# Patient Record
Sex: Male | Born: 1945
Health system: Southern US, Community
[De-identification: ages and names within clinical notes are randomized; demographics above are authoritative.]

## PROBLEM LIST (undated history)

## (undated) DIAGNOSIS — J302 Other seasonal allergic rhinitis: Secondary | ICD-10-CM

## (undated) DIAGNOSIS — I2699 Other pulmonary embolism without acute cor pulmonale: Secondary | ICD-10-CM

## (undated) DIAGNOSIS — E785 Hyperlipidemia, unspecified: Secondary | ICD-10-CM

## (undated) DIAGNOSIS — E039 Hypothyroidism, unspecified: Secondary | ICD-10-CM

## (undated) DIAGNOSIS — K5790 Diverticulosis of intestine, part unspecified, without perforation or abscess without bleeding: Secondary | ICD-10-CM

## (undated) DIAGNOSIS — I82409 Acute embolism and thrombosis of unspecified deep veins of unspecified lower extremity: Secondary | ICD-10-CM

## (undated) DIAGNOSIS — K649 Unspecified hemorrhoids: Secondary | ICD-10-CM

## (undated) DIAGNOSIS — M5412 Radiculopathy, cervical region: Secondary | ICD-10-CM

## (undated) DIAGNOSIS — J3089 Other allergic rhinitis: Secondary | ICD-10-CM

## (undated) DIAGNOSIS — K219 Gastro-esophageal reflux disease without esophagitis: Secondary | ICD-10-CM

## (undated) HISTORY — DX: Radiculopathy, cervical region: M54.12

## (undated) HISTORY — DX: Acute embolism and thrombosis of unspecified deep veins of unspecified lower extremity: I82.409

## (undated) HISTORY — DX: Diverticulosis of intestine, part unspecified, without perforation or abscess without bleeding: K57.90

## (undated) HISTORY — DX: Other seasonal allergic rhinitis: J30.2

## (undated) HISTORY — DX: Gastro-esophageal reflux disease without esophagitis: K21.9

## (undated) HISTORY — DX: Hyperlipidemia, unspecified: E78.5

## (undated) HISTORY — DX: Hypothyroidism, unspecified: E03.9

## (undated) HISTORY — DX: Gilbert syndrome: E80.4

## (undated) HISTORY — DX: Other pulmonary embolism without acute cor pulmonale: I26.99

## (undated) HISTORY — DX: Unspecified hemorrhoids: K64.9

## (undated) HISTORY — PX: WISDOM TOOTH EXTRACTION: SHX21

## (undated) HISTORY — DX: Other seasonal allergic rhinitis: J30.89

---

## 2001-04-28 ENCOUNTER — Emergency Department (HOSPITAL_COMMUNITY): Admission: EM | Admit: 2001-04-28 | Discharge: 2001-04-28 | Payer: Self-pay | Admitting: Emergency Medicine

## 2001-04-28 ENCOUNTER — Encounter: Payer: Self-pay | Admitting: Emergency Medicine

## 2001-05-24 ENCOUNTER — Ambulatory Visit (HOSPITAL_COMMUNITY): Admission: RE | Admit: 2001-05-24 | Discharge: 2001-05-24 | Payer: Self-pay | Admitting: Cardiology

## 2001-05-24 ENCOUNTER — Encounter: Payer: Self-pay | Admitting: Cardiology

## 2003-06-10 ENCOUNTER — Encounter: Payer: Self-pay | Admitting: Internal Medicine

## 2004-08-29 ENCOUNTER — Ambulatory Visit: Payer: Self-pay | Admitting: Internal Medicine

## 2004-10-05 ENCOUNTER — Ambulatory Visit: Payer: Self-pay | Admitting: Internal Medicine

## 2004-10-06 ENCOUNTER — Ambulatory Visit: Payer: Self-pay | Admitting: Internal Medicine

## 2004-12-26 ENCOUNTER — Ambulatory Visit: Payer: Self-pay | Admitting: Internal Medicine

## 2005-07-11 ENCOUNTER — Ambulatory Visit: Payer: Self-pay | Admitting: Internal Medicine

## 2006-10-01 ENCOUNTER — Ambulatory Visit: Payer: Self-pay | Admitting: Gastroenterology

## 2006-10-15 ENCOUNTER — Ambulatory Visit: Payer: Self-pay | Admitting: Gastroenterology

## 2006-10-15 ENCOUNTER — Encounter: Payer: Self-pay | Admitting: Internal Medicine

## 2007-01-14 DIAGNOSIS — K219 Gastro-esophageal reflux disease without esophagitis: Secondary | ICD-10-CM

## 2007-01-14 HISTORY — DX: Other disorders of bilirubin metabolism: E80.6

## 2007-01-14 HISTORY — DX: Gastro-esophageal reflux disease without esophagitis: K21.9

## 2007-01-17 ENCOUNTER — Ambulatory Visit: Payer: Self-pay | Admitting: Internal Medicine

## 2007-01-19 LAB — CONVERTED CEMR LAB
ALT: 32 units/L (ref 0–40)
Basophils Absolute: 0 10*3/uL (ref 0.0–0.1)
Basophils Relative: 0.5 % (ref 0.0–1.0)
Calcium: 9.6 mg/dL (ref 8.4–10.5)
Cholesterol: 240 mg/dL (ref 0–200)
Creatinine, Ser: 1 mg/dL (ref 0.4–1.5)
Direct LDL: 147.8 mg/dL
Eosinophils Relative: 1.5 % (ref 0.0–5.0)
GFR calc Af Amer: 97 mL/min
Glucose, Bld: 90 mg/dL (ref 70–99)
HDL: 42.4 mg/dL (ref >39.0)
Hgb A1c MFr Bld: 5.6 % (ref 4.6–6.0)
Lymphocytes Relative: 30.3 % (ref 12.0–46.0)
Monocytes Relative: 12.1 % — ABNORMAL HIGH (ref 3.0–11.0)
Neutrophils Relative %: 55.6 % (ref 43.0–77.0)
PSA: 0.19 ng/mL (ref 0.10–4.00)
Platelets: 212 10*3/uL (ref 150–400)
RBC: 4.77 M/uL (ref 4.22–5.81)
Total Bilirubin: 0.8 mg/dL (ref 0.3–1.2)
VLDL: 41 mg/dL — ABNORMAL HIGH (ref 0–40)
WBC: 5.3 10*3/uL (ref 4.5–10.5)

## 2008-01-21 ENCOUNTER — Encounter: Admission: RE | Admit: 2008-01-21 | Discharge: 2008-01-21 | Payer: Self-pay | Admitting: Orthopaedic Surgery

## 2008-06-09 ENCOUNTER — Encounter: Payer: Self-pay | Admitting: Internal Medicine

## 2008-08-14 HISTORY — PX: COLONOSCOPY: SHX174

## 2009-07-30 ENCOUNTER — Ambulatory Visit: Payer: Self-pay | Admitting: Internal Medicine

## 2009-08-08 LAB — CONVERTED CEMR LAB
ALT: 24 units/L (ref 0–53)
Alkaline Phosphatase: 50 units/L (ref 39–117)
BUN: 15 mg/dL (ref 6–23)
Basophils Relative: 0.4 % (ref 0.0–3.0)
Bilirubin, Direct: 0.1 mg/dL (ref 0.0–0.3)
CO2: 29 meq/L (ref 19–32)
Calcium: 9.2 mg/dL (ref 8.4–10.5)
Chloride: 104 meq/L (ref 96–112)
Eosinophils Relative: 4.7 % (ref 0.0–5.0)
Lymphocytes Relative: 26.4 % (ref 12.0–46.0)
MCV: 95 fL (ref 78.0–100.0)
Monocytes Relative: 10.6 % (ref 3.0–12.0)
Neutrophils Relative %: 57.9 % (ref 43.0–77.0)
PSA: 0.28 ng/mL (ref 0.10–4.00)
Potassium: 4.3 meq/L (ref 3.5–5.1)
RBC: 4.56 M/uL (ref 4.22–5.81)
RDW: 12.3 % (ref 11.5–14.6)
Sodium: 140 meq/L (ref 135–145)
Total Protein: 7.1 g/dL (ref 6.0–8.3)
Triglycerides: 190 mg/dL — ABNORMAL HIGH (ref 0.0–149.0)
WBC: 5.9 10*3/uL (ref 4.5–10.5)

## 2009-08-09 ENCOUNTER — Encounter (INDEPENDENT_AMBULATORY_CARE_PROVIDER_SITE_OTHER): Payer: Self-pay | Admitting: *Deleted

## 2009-08-16 ENCOUNTER — Ambulatory Visit: Payer: Self-pay | Admitting: Internal Medicine

## 2009-08-16 DIAGNOSIS — R9431 Abnormal electrocardiogram [ECG] [EKG]: Secondary | ICD-10-CM

## 2009-08-16 DIAGNOSIS — J309 Allergic rhinitis, unspecified: Secondary | ICD-10-CM | POA: Insufficient documentation

## 2009-08-16 DIAGNOSIS — E785 Hyperlipidemia, unspecified: Secondary | ICD-10-CM | POA: Insufficient documentation

## 2009-08-16 DIAGNOSIS — K5730 Diverticulosis of large intestine without perforation or abscess without bleeding: Secondary | ICD-10-CM

## 2009-08-16 HISTORY — DX: Diverticulosis of large intestine without perforation or abscess without bleeding: K57.30

## 2009-08-16 HISTORY — DX: Abnormal electrocardiogram (ECG) (EKG): R94.31

## 2009-08-16 HISTORY — DX: Allergic rhinitis, unspecified: J30.9

## 2009-09-01 ENCOUNTER — Encounter (INDEPENDENT_AMBULATORY_CARE_PROVIDER_SITE_OTHER): Payer: Self-pay | Admitting: *Deleted

## 2009-09-24 ENCOUNTER — Encounter: Payer: Self-pay | Admitting: Internal Medicine

## 2009-09-27 ENCOUNTER — Encounter: Payer: Self-pay | Admitting: Internal Medicine

## 2009-09-28 ENCOUNTER — Encounter: Payer: Self-pay | Admitting: Internal Medicine

## 2009-10-01 ENCOUNTER — Encounter: Admission: RE | Admit: 2009-10-01 | Discharge: 2009-10-01 | Payer: Self-pay | Admitting: Cardiology

## 2009-10-08 ENCOUNTER — Ambulatory Visit (HOSPITAL_COMMUNITY): Admission: RE | Admit: 2009-10-08 | Discharge: 2009-10-08 | Payer: Self-pay | Admitting: Cardiology

## 2009-10-08 ENCOUNTER — Encounter: Payer: Self-pay | Admitting: Internal Medicine

## 2009-10-11 ENCOUNTER — Encounter: Payer: Self-pay | Admitting: Internal Medicine

## 2010-02-18 ENCOUNTER — Encounter: Payer: Self-pay | Admitting: Internal Medicine

## 2010-06-13 ENCOUNTER — Telehealth (INDEPENDENT_AMBULATORY_CARE_PROVIDER_SITE_OTHER): Payer: Self-pay | Admitting: *Deleted

## 2010-08-31 ENCOUNTER — Other Ambulatory Visit: Payer: Self-pay | Admitting: Internal Medicine

## 2010-08-31 ENCOUNTER — Ambulatory Visit
Admission: RE | Admit: 2010-08-31 | Discharge: 2010-08-31 | Payer: Self-pay | Source: Home / Self Care | Attending: Internal Medicine | Admitting: Internal Medicine

## 2010-08-31 LAB — HEPATIC FUNCTION PANEL
ALT: 27 U/L (ref 0–53)
AST: 26 U/L (ref 0–37)
Albumin: 3.8 g/dL (ref 3.5–5.2)
Alkaline Phosphatase: 54 U/L (ref 39–117)
Bilirubin, Direct: 0.1 mg/dL (ref 0.0–0.3)
Total Bilirubin: 0.7 mg/dL (ref 0.3–1.2)
Total Protein: 6.7 g/dL (ref 6.0–8.3)

## 2010-08-31 LAB — LIPID PANEL
Cholesterol: 179 mg/dL (ref 0–200)
HDL: 44.8 mg/dL (ref >39.00)
LDL Cholesterol: 104 mg/dL — ABNORMAL HIGH (ref 0–99)
Total CHOL/HDL Ratio: 4
Triglycerides: 150 mg/dL — ABNORMAL HIGH (ref 0.0–149.0)
VLDL: 30 mg/dL (ref 0.0–40.0)

## 2010-08-31 LAB — CBC WITH DIFFERENTIAL/PLATELET
Basophils Absolute: 0 10*3/uL (ref 0.0–0.1)
Basophils Relative: 0.4 % (ref 0.0–3.0)
Eosinophils Absolute: 0.1 10*3/uL (ref 0.0–0.7)
Eosinophils Relative: 1.9 % (ref 0.0–5.0)
HCT: 42.9 % (ref 39.0–52.0)
Hemoglobin: 14.7 g/dL (ref 13.0–17.0)
Lymphocytes Relative: 32.4 % (ref 12.0–46.0)
Lymphs Abs: 1.9 10*3/uL (ref 0.7–4.0)
MCHC: 34.2 g/dL (ref 30.0–36.0)
MCV: 93.4 fl (ref 78.0–100.0)
Monocytes Absolute: 0.7 10*3/uL (ref 0.1–1.0)
Monocytes Relative: 12.7 % — ABNORMAL HIGH (ref 3.0–12.0)
Neutro Abs: 3 10*3/uL (ref 1.4–7.7)
Neutrophils Relative %: 52.6 % (ref 43.0–77.0)
Platelets: 194 10*3/uL (ref 150.0–400.0)
RBC: 4.59 Mil/uL (ref 4.22–5.81)
RDW: 13.4 % (ref 11.5–14.6)
WBC: 5.7 10*3/uL (ref 4.5–10.5)

## 2010-08-31 LAB — BASIC METABOLIC PANEL
BUN: 20 mg/dL (ref 6–23)
CO2: 27 mEq/L (ref 19–32)
Calcium: 9.1 mg/dL (ref 8.4–10.5)
Chloride: 108 mEq/L (ref 96–112)
Creatinine, Ser: 1.3 mg/dL (ref 0.4–1.5)
GFR: 59.73 mL/min — ABNORMAL LOW (ref >60.00)
Glucose, Bld: 89 mg/dL (ref 70–99)
Potassium: 4.6 mEq/L (ref 3.5–5.1)
Sodium: 141 mEq/L (ref 135–145)

## 2010-08-31 LAB — TSH: TSH: 6.62 u[IU]/mL — ABNORMAL HIGH (ref 0.35–5.50)

## 2010-08-31 LAB — PSA: PSA: 0.29 ng/mL (ref 0.10–4.00)

## 2010-08-31 LAB — CONVERTED CEMR LAB
Ketones, urine, test strip: NEGATIVE
Protein, U semiquant: NEGATIVE
WBC Urine, dipstick: NEGATIVE

## 2010-09-01 ENCOUNTER — Encounter: Payer: Self-pay | Admitting: Internal Medicine

## 2010-09-07 ENCOUNTER — Other Ambulatory Visit: Payer: Self-pay | Admitting: Internal Medicine

## 2010-09-07 ENCOUNTER — Ambulatory Visit
Admission: RE | Admit: 2010-09-07 | Discharge: 2010-09-07 | Payer: Self-pay | Source: Home / Self Care | Attending: Internal Medicine | Admitting: Internal Medicine

## 2010-09-07 DIAGNOSIS — R946 Abnormal results of thyroid function studies: Secondary | ICD-10-CM

## 2010-09-07 HISTORY — DX: Abnormal results of thyroid function studies: R94.6

## 2010-09-07 LAB — TSH: TSH: 5.52 u[IU]/mL — ABNORMAL HIGH (ref 0.35–5.50)

## 2010-09-07 LAB — T3, FREE: T3, Free: 2.5 pg/mL (ref 2.3–4.2)

## 2010-09-13 NOTE — Letter (Signed)
Summary: Cancer Screening/[facility] Personalized Risk Profile  Cancer Screening/[facility] Personalized Risk Profile   Imported By: Lanelle Bal 08/23/2009 08:36:36  _____________________________________________________________________  External Attachment:    Type:   Image     Comment:   External Document

## 2010-09-13 NOTE — Letter (Signed)
Summary: Primary Care Consult Scheduled Letter  [facility] [place]  [address] [place], [place] [address]   Phone: [phone]  Fax: [phone]      09/01/2009 MRN: [id number]  Chase Hill [address] [place], [place]  [address]    Dear Mr. LANCON,    We have scheduled an appointment for you.  At the recommendation of Dr. Marga Melnick, you have been scheduled for a consult with Dr. Julieanne Manson with [facility] on 09-24-2009 at 9:00am.  Their address is [address], [place] [address]. The office phone number is [phone].  If this appointment day and time is not convenient for you, please feel free to call the office of the doctor you are being referred to at the number listed above and reschedule the appointment.    It is important for you to keep your scheduled appointments. We are here to make sure you are given good patient care.   Thank you,    Renee, Patient Care Coordinator [place] at [facility]

## 2010-09-13 NOTE — Consult Note (Signed)
Summary: [facility]  [facility]   Imported By: Lanelle Bal 10/02/2009 08:44:37  _____________________________________________________________________  External Attachment:    Type:   Image     Comment:   External Document

## 2010-09-13 NOTE — Assessment & Plan Note (Signed)
Summary: cpx/ns/kdc   Vital Signs:  Patient profile:   65 year old male Height:      72 inches Weight:      220 pounds BMI:     29.95 Temp:     98.3 degrees F oral Pulse rate:   80 / minute Resp:     14 per minute BP sitting:   112 / 82  (left arm) Cuff size:   large  Vitals Entered By: Shonna Chock (August 16, 2009 1:52 PM)  Comments REVIEWED MED LIST, PATIENT AGREED DOSE AND INSTRUCTION CORRECT    History of Present Illness: Chase Hill is here for a physical; he had  non exertional chest pains 2-3 weeks ago in context of  stress X 1 hour. No cp with CVE.PMH of PVCs; nuclear stress test nagative 2002 by Dr Clarene Duke. All symptoms have resolved. See LDL  down from 163 to 127.6 on Red Rice Yeast. Vytorin caused myalgias  Preventive Screening-Counseling & Management  Caffeine-Diet-Exercise     Does Patient Exercise: yes  Allergies: No Known Drug Allergies  Past History:  Past Medical History: Gilbert's Syndrome Allergic rhinitis GERD PVCs 2002 , neg Stress Test, Dr Clarene Duke Hyperlipidemia: NMR 2004: LDL 163(2082/1146), TG 158, HDL 42; LDL goal = < 100. Diverticulosis, colon 2008  Past Surgical History: Wisdom Teeth Extraction; Colonoscopy : Tics 2008, due 2019, Dr Jarold Motto  Family History: Father: CAD, CHF Mother: breast Ca Siblings: none; P aunt CAD; PGF CAD; MGM CVA; M aunt CAD;   Social History: Never Smoked Alcohol use-yes-socially Occupation: Attorney Married Regular exercise-yes: walks golf course 2X/week Does Patient Exercise:  yes  Review of Systems       The patient complains of suspicious skin lesions.  The patient denies anorexia, fever, vision loss, decreased hearing, hoarseness, prolonged cough, headaches, hemoptysis, abdominal pain, melena, hematochezia, severe indigestion/heartburn, hematuria, incontinence, depression, unusual weight change, abnormal bleeding, enlarged lymph nodes, and angioedema.         Weight up 5# from holidays. New lesion  R thigh, ? darker. CV:  Denies chest pain or discomfort, difficulty breathing at night, difficulty breathing while lying down, leg cramps with exertion, lightheadness, near fainting, palpitations, shortness of breath with exertion, swelling of feet, and swelling of hands.  Physical Exam  General:  well-nourished,in no acute distress; alert,appropriate and cooperative throughout examination Head:  Normocephalic and atraumatic without obvious abnormalities.  Eyes:  No corneal or conjunctival inflammation noted.Perrla. Funduscopic exam benign, without hemorrhages, exudates or papilledema.  Ears:  External ear exam shows no significant lesions or deformities.  Otoscopic examination reveals clear canals, tympanic membranes are intact bilaterally without bulging, retraction, inflammation or discharge. Hearing is grossly normal bilaterally. Nose:  External nasal examination shows no deformity or inflammation. Nasal mucosa are pink and moist without lesions or exudates. Mouth:  Oral mucosa and oropharynx without lesions or exudates.  Teeth in good repair. Neck:  No deformities, masses, or tenderness noted. Lungs:  Normal respiratory effort, chest expands symmetrically. Lungs are clear to auscultation, no crackles or wheezes. Heart:  normal rate, no gallop, no rub, no JVD, no HJR, and grade1/2-1   /6 systolic murmur @ base.   Abdomen:  Bowel sounds positive,abdomen soft and non-tender without masses, organomegaly. Rectal:  No external abnormalities noted. Normal sphincter tone. No rectal masses or tenderness. Genitalia:  Testes bilaterally descended without nodularity, tenderness or masses. No scrotal masses or lesions. No penis lesions or urethral discharge. L varicocele.   Prostate:  Prostate gland firm and  smooth, no enlargement, nodularity, tenderness, mass, asymmetry or induration. Msk:  No deformity or scoliosis noted of thoracic or lumbar spine.   Pulses:  R and L carotid,radial,dorsalis pedis and  posterior tibial pulses are full and equal bilaterally Extremities:  No clubbing, cyanosis, edema, or deformity noted with normal full range of motion of all joints.  Minor crepitus of knees  Neurologic:  alert & oriented X3 and DTRs symmetrical and normal.   Cervical Nodes:  No lymphadenopathy noted Axillary Nodes:  No palpable lymphadenopathy Inguinal Nodes:  No significant adenopathy Psych:  memory intact for recent and remote, normally interactive, and good eye contact.     Impression & Recommendations:  Problem # 1:  PREVENTIVE HEALTH CARE (ICD-V70.0)  Orders: EKG w/ Interpretation (93[address])  Problem # 2:  CHEST PAIN UNSPECIFIED (ICD-786.50)  resolved  Orders: EKG w/ Interpretation (93000) Cardiology Referral (Cardiology)  Problem # 3:  HYPERLIPIDEMIA (ICD-272.4)  Orders: EKG w/ Interpretation (93000)  Problem # 4:  ELECTROCARDIOGRAM, ABNORMAL (ICD-794.31)  RBBB & L post fascicular block new since 01/17/2007  Orders: EKG w/ Interpretation (93[address]) Cardiology Referral (Cardiology)  Problem # 5:  REFLUX, ESOPHAGEAL (ICD-530.81)  Problem # 6:  DIVERTICULOSIS, COLON (ICD-562.10)  Complete Medication List: 1)  Claritin 10 Mg Tabs (Loratadine) .... Prn 2)  Multivitamins Caps (Multiple vitamin) .Marland Kitchen.. 1 by mouth qd 3)  Propecia Tabs (Finasteride tabs) .Marland Kitchen.. 1 by mouth qd 4)  Folic Acid 400 Mcg Tabs (Folic acid)  Patient Instructions: 1)  Consume LESS THAN 40 grams of sugar/ day from LABELED foods & drinks. 2)  Please schedule a follow-up appointment in 4 months. 3)  NMR Lipoprofile Lipid Panel prior to visit, ICD-9:272.4

## 2010-09-13 NOTE — Cardiovascular Report (Signed)
[facility]   Imported By: Lanelle Bal 10/22/2009 08:08:18  _____________________________________________________________________  External Attachment:    Type:   Image     Comment:   External Document

## 2010-09-13 NOTE — Progress Notes (Signed)
Summary: lab order  Phone Note Call from Patient   Summary of Call: patient has cpx scheduled [id number] ---lab [phone]   ---- need lab order  Initial call taken by: Okey Regal Spring,  June 13, 2010 4:13 PM  Follow-up for Phone Call        Lipid,Hep,BMP,CBCD,TSH, PSA,Udip, Stool Cards V70.0/272.4 Follow-up by: Shonna Chock CMA,  June 13, 2010 4:30 PM  Additional Follow-up for Phone Call Additional follow up Details #1::        added lab order to appt .Marland KitchenOkey Regal Spring  June 14, 2010 9:22 AM

## 2010-09-13 NOTE — Letter (Signed)
Summary: [facility]  [facility]   Imported By: Lanelle Bal 03/08/2010 09:18:24  _____________________________________________________________________  External Attachment:    Type:   Image     Comment:   External Document

## 2010-09-13 NOTE — Letter (Signed)
Summary: [facility]  [facility]   Imported By: Lanelle Bal 10/21/2009 10:48:42  _____________________________________________________________________  External Attachment:    Type:   Image     Comment:   External Document

## 2010-09-13 NOTE — Procedures (Signed)
Summary: Colonoscopy/[facility]  Colonoscopy/[facility]   Imported By: Lanelle Bal 08/23/2009 08:40:25  _____________________________________________________________________  External Attachment:    Type:   Image     Comment:   External Document

## 2010-09-15 NOTE — Assessment & Plan Note (Signed)
Summary: CPX/CBS   Vital Signs:  Patient profile:   65 year old male Height:      71.75 inches Weight:      220 pounds BMI:     30.15 Temp:     98.7 degrees F oral Pulse rate:   56 / minute Resp:     14 per minute BP sitting:   122 / 84  (left arm) Cuff size:   large  Vitals Entered By: Shonna Chock CMA (September 07, 2010 8:36 AM)  CC: CPX and discuss labs(copy given) , General Medical Evaluation   CC:  CPX and discuss labs(copy given)  and General Medical Evaluation.  History of Present Illness:    Chase Hill is here for a physical; he is asymptomatic.  Current Medications (verified): 1)  Claritin 10 Mg Tabs (Loratadine) .... Prn 2)  Multivitamins  Caps (Multiple Vitamin) .Marland Kitchen.. 1 By Mouth Qd 3)  Propecia  Tabs (Finasteride Tabs) .Marland Kitchen.. 1 By Mouth Qd 4)  Folic Acid 400 Mcg Tabs (Folic Acid) 5)  Pravastatin Sodium 20 Mg Tabs (Pravastatin Sodium) .Marland Kitchen.. 1 By Mouth Once Daily  Allergies (verified): No Known Drug Allergies  Past History:  Past Medical History: Gilbert's Syndrome Allergic rhinitis GERD PVCs 2002 , negative  Stress Test, Dr  Caprice Kluver Hyperlipidemia: NMR 2004: LDL 163(2082/1146), TG 158, HDL 42; LDL goal = < 100. Diverticulosis, colon 2008 C-7 radiculitis , PMH of   Past Surgical History: Wisdom Teeth Extraction; Colonoscopy : Diverticulosis 2008, due 2018, Dr Sheryn Bison; Catheterization done for chest pain & EKG changes 2011: normal, Al Little, MD  Family History: Father: CAD, CHF Mother: breast cancer Siblings: none; P aunt: CAD; PGF: CAD; MGM: CVA; M aunt :CAD;    Social History: Never Smoked Alcohol use-yes-socially Occupation: Pensions consultant Married Regular exercise-yes: walks golf course 2X/week, treadmill in home 1-2 mpd  2X/week in Winter  Review of Systems  The patient denies anorexia, fever, weight loss, weight gain, vision loss, decreased hearing, hoarseness, chest pain, syncope, dyspnea on exertion, peripheral edema, prolonged cough,  headaches, hemoptysis, abdominal pain, melena, hematochezia, severe indigestion/heartburn, hematuria, suspicious skin lesions, depression, unusual weight change, abnormal bleeding, enlarged lymph nodes, and angioedema.    Physical Exam  General:  well-nourished;alert,appropriate and cooperative throughout examination Head:  Normocephalic and atraumatic without obvious abnormalities.  Pattern  alopecia  Eyes:  No corneal or conjunctival inflammation noted. Perrla. Funduscopic exam benign, without hemorrhages, exudates or papilledema. Ears:  External ear exam shows no significant lesions or deformities.  Otoscopic examination reveals clear canals, tympanic membranes are intact bilaterally without bulging, retraction, inflammation or discharge. Hearing is grossly normal bilaterally. Nose:  External nasal examination shows no deformity or inflammation. Nasal mucosa are pink and moist without lesions or exudates. Mouth:  Oral mucosa and oropharynx without lesions or exudates.  Teeth in good repair. Neck:  No deformities, masses, or tenderness noted. Lungs:  Normal respiratory effort, chest expands symmetrically. Lungs are clear to auscultation, no crackles or wheezes. Heart:  normal rate, regular rhythm, no gallop, no rub, no JVD, no HJR, and grade 1 /6 systolic murmur.   Abdomen:  Bowel sounds positive,abdomen soft and non-tender without masses, organomegaly or hernias noted. Rectal:  No external abnormalities noted. Normal sphincter tone. No rectal masses or tenderness. Genitalia:  Testes bilaterally descended without nodularity, tenderness or masses. No scrotal masses or lesions. No penis lesions or urethral discharge. Small L varicocele.   Prostate:  Prostate gland firm and smooth, no enlargement, nodularity, tenderness, mass,  asymmetry or induration. Msk:  No deformity or scoliosis noted of thoracic or lumbar spine.   Pulses:  R and L carotid,radial,dorsalis pedis and posterior tibial pulses are  full and equal bilaterally Extremities:  No clubbing, cyanosis, edema, or deformity noted with normal full range of motion of all joints.   Neurologic:  alert & oriented X3, strength normal in all extremities, and DTRs symmetrical and normal.   Skin:  Intact without suspicious lesions or rashes Cervical Nodes:  No lymphadenopathy noted Axillary Nodes:  No palpable lymphadenopathy Inguinal Nodes:  No significant adenopathy Psych:  memory intact for recent and remote, normally interactive, and good eye contact.     Impression & Recommendations:  Problem # 1:  ROUTINE GENERAL MEDICAL EXAM@HEALTH  CARE FACL (ICD-V70.0)  Problem # 2:  HYPERLIPIDEMIA (ICD-272.4)  His updated medication list for this problem includes:    Pravastatin Sodium 20 Mg Tabs (Pravastatin sodium) .Marland Kitchen... 1 by mouth once daily  Problem # 3:  ELECTROCARDIOGRAM, ABNORMAL (ICD-794.31) PMH of, negative cath   Problem # 4:  REFLUX, ESOPHAGEAL (ICD-530.81) quiescent  Complete Medication List: 1)  Claritin 10 Mg Tabs (Loratadine) .... Prn 2)  Multivitamins Caps (Multiple vitamin) .Marland Kitchen.. 1 by mouth qd 3)  Propecia Tabs (Finasteride tabs) .Marland Kitchen.. 1 by mouth qd 4)  Folic Acid 400 Mcg Tabs (Folic acid) 5)  Pravastatin Sodium 20 Mg Tabs (Pravastatin sodium) .Marland Kitchen.. 1 by mouth once daily  Other Orders: Venipuncture (16109) TLB-TSH (Thyroid Stimulating Hormone) (84443-TSH) TLB-T3, Free (Triiodothyronine) (84481-T3FREE) TLB-T4 (Thyrox), Free [phone])  Patient Instructions: 1)  Please schedule a follow-up  fasting lab appointment in 4 months for TSH & [facility] Lipid Panel(794.5, 272.4)   Orders Added: 1)  Est. Patient 65& > [99397] 2)  Venipuncture [19147] 3)  TLB-TSH (Thyroid Stimulating Hormone) [84443-TSH] 4)  TLB-T3, Free (Triiodothyronine) [82956-O1HYQM] 5)  TLB-T4 (Thyrox), Free [57846-NG2X]  Appended Document: CPX/CBS   Appended Document: CPX/CBS   Immunizations Administered:  Influenza Vaccine # 1:     Vaccine Type: Fluvax Non-MCR    Site: left deltoid    Mfr: Sanofi Pasteur    Dose: 0.5 ml    Route: IM    Given by: Shonna Chock CMA    Exp. Date: 02/11/2011    Lot #: [id number]

## 2010-10-19 ENCOUNTER — Telehealth (INDEPENDENT_AMBULATORY_CARE_PROVIDER_SITE_OTHER): Payer: Self-pay | Admitting: *Deleted

## 2010-10-25 NOTE — Progress Notes (Signed)
Summary: questions about thyroid-lmom  Phone Note Call from Patient Call back at Work Phone [phone]   Caller: Patient Summary of Call: patient called had questions about thyroid   Follow-up for Phone Call        left message on machine .Marland KitchenMarland KitchenMarland KitchenDoristine Devoid CMA  October 19, 2010 2:36 PM   patient called back says that he will start taking thyroid medication....Marland KitchenMarland KitchenDoristine Devoid CMA  October 19, 2010 3:31 PM

## 2010-11-04 LAB — T4, FREE: Free T4: 0.93 ng/dL (ref 0.80–1.80)

## 2011-01-21 ENCOUNTER — Other Ambulatory Visit: Payer: Self-pay | Admitting: Internal Medicine

## 2011-01-23 ENCOUNTER — Telehealth: Payer: Self-pay | Admitting: Internal Medicine

## 2011-01-23 NOTE — Telephone Encounter (Signed)
Patient's wife called --gave her message below---she will tell husband to call us back to schedule lab appt

## 2011-01-23 NOTE — Telephone Encounter (Signed)
Please schedule labs- TSH -794.5 244.9

## 2011-01-23 NOTE — Telephone Encounter (Signed)
Per labs done 09/10/10 labs where to be check in 10 weeks. Pt needs labs before refilling medication.

## 2011-01-25 ENCOUNTER — Other Ambulatory Visit (INDEPENDENT_AMBULATORY_CARE_PROVIDER_SITE_OTHER): Payer: BC Managed Care – PPO

## 2011-01-25 DIAGNOSIS — E039 Hypothyroidism, unspecified: Secondary | ICD-10-CM

## 2011-01-25 LAB — TSH: TSH: 5.84 u[IU]/mL — ABNORMAL HIGH (ref 0.35–5.50)

## 2011-04-26 ENCOUNTER — Other Ambulatory Visit: Payer: Self-pay | Admitting: Internal Medicine

## 2011-04-26 ENCOUNTER — Telehealth: Payer: Self-pay | Admitting: *Deleted

## 2011-04-26 DIAGNOSIS — R946 Abnormal results of thyroid function studies: Secondary | ICD-10-CM

## 2011-04-26 NOTE — Telephone Encounter (Signed)
Patient call back to clarify that he does not need to go to our [place] office for Labs, to instead come to [place] office. Per Chrae, this is correct, and we just need to schedule Lab visit [order placed]. Need to order [facility] Lab with orders already in system.  Called patient at work number given and [facility] for patient to call back to let us know what day he would like to come in.

## 2011-04-27 NOTE — Telephone Encounter (Signed)
Patient left message to call on work phone number [phone] When called, Pt was out of office; secretary will have him call back and ask for either myself or Chrae.

## 2011-04-28 ENCOUNTER — Other Ambulatory Visit: Payer: Self-pay | Admitting: Internal Medicine

## 2011-04-28 DIAGNOSIS — T887XXA Unspecified adverse effect of drug or medicament, initial encounter: Secondary | ICD-10-CM

## 2011-04-28 DIAGNOSIS — E039 Hypothyroidism, unspecified: Secondary | ICD-10-CM

## 2011-04-28 DIAGNOSIS — E785 Hyperlipidemia, unspecified: Secondary | ICD-10-CM

## 2011-04-28 NOTE — Telephone Encounter (Signed)
Patient called back and scheduled appointment for Monday (lab), patient instructed to fast

## 2011-05-01 ENCOUNTER — Other Ambulatory Visit (INDEPENDENT_AMBULATORY_CARE_PROVIDER_SITE_OTHER): Payer: BC Managed Care – PPO

## 2011-05-01 DIAGNOSIS — E039 Hypothyroidism, unspecified: Secondary | ICD-10-CM

## 2011-05-01 LAB — TSH: TSH: 3.95 u[IU]/mL (ref 0.35–5.50)

## 2011-05-01 NOTE — Progress Notes (Signed)
Labs only

## 2011-05-04 ENCOUNTER — Ambulatory Visit (INDEPENDENT_AMBULATORY_CARE_PROVIDER_SITE_OTHER): Payer: BC Managed Care – PPO | Admitting: Internal Medicine

## 2011-05-04 ENCOUNTER — Encounter: Payer: Self-pay | Admitting: Internal Medicine

## 2011-05-04 DIAGNOSIS — R946 Abnormal results of thyroid function studies: Secondary | ICD-10-CM

## 2011-05-04 DIAGNOSIS — E039 Hypothyroidism, unspecified: Secondary | ICD-10-CM

## 2011-05-04 NOTE — Patient Instructions (Signed)
Please  schedule  Labs after 10-12 weeks :  ZOX(096.0)

## 2011-05-04 NOTE — Progress Notes (Signed)
  Subjective:    Patient ID: Chase Hill, male    DOB: [birth year], 65 y.o.   MRN: [id number]  HPI Thyroid function monitor :TSH 3.85 on 25 mcg / day Medications status(change in dose/brand/mode of administration):no Constitutional: Weight change: no; Fatigue:no; Sleep pattern:good; Appetite:good  Visual change(blurred/diplopia/visual loss):no Hoarseness:no; Swallowing issues:no Cardiovascular: Palpitations:no; Racing:no; Irregularity:no GI: Constipation:no; Diarrhea:no Derm: Change in nails/hair/skin:no Neuro: Numbness/tingling:no; Tremor:no Psych: Anxiety:no Endo: Temperature intolerance: Heat:no; Cold:no      Review of Systems     Objective:   Physical Exam  Gen.: well-nourished; in no acute distress Eyes: Extraocular motion intact; no lid lag or proptosis. Ptosis bilaterally. Some thinning of eyebrows laterally. Neck: full ROM; thyroid normal Heart: Normal rhythm and rate without significant murmur, gallop, or extra heart sounds Lungs: Chest clear to auscultation without rales,rales, wheezes Neuro:Deep tendon reflexes are equal and within normal limits; no tremor  Skin: Warm and dry without significant lesions or rashes; no onycholysis Psych: Normally communicative and interactive; no abnormal mood or affect clinically.         Assessment & Plan:  #1 hypothyroidism; TSH slightly above the ideal range of 1- 3  Plan: Increase thyroid to 25 mcg daily except 2 pills (50 mcg) Tues and Thursdays. Recheck TSH after 10-12 weeks.

## 2011-05-12 ENCOUNTER — Other Ambulatory Visit: Payer: Self-pay | Admitting: Internal Medicine

## 2011-05-12 MED ORDER — LEVOTHYROXINE SODIUM 25 MCG PO TABS: 25.0000 ug | ORAL_TABLET | Freq: Every day | ORAL | Status: DC

## 2011-05-12 NOTE — Telephone Encounter (Signed)
RX sent

## 2011-05-18 ENCOUNTER — Encounter: Payer: Self-pay | Admitting: Internal Medicine

## 2011-10-23 ENCOUNTER — Telehealth: Payer: Self-pay | Admitting: Internal Medicine

## 2011-10-23 DIAGNOSIS — E039 Hypothyroidism, unspecified: Secondary | ICD-10-CM

## 2011-10-23 DIAGNOSIS — T887XXA Unspecified adverse effect of drug or medicament, initial encounter: Secondary | ICD-10-CM

## 2011-10-23 DIAGNOSIS — E785 Hyperlipidemia, unspecified: Secondary | ICD-10-CM

## 2011-10-23 NOTE — Telephone Encounter (Signed)
Patient called to set up physical for may 17. He would like to go to [place] for his labs the week before. Can you go ahead & set up the lab orders in the system & I can call him back to let him know its ok? Please review & advise Thanks Patient ph# [phone]

## 2011-10-24 NOTE — Telephone Encounter (Signed)
Was patient told that insurance may or may not cover. Orders are placed, patient should check to make sure labs covered prior to prevent getting billed

## 2011-10-24 NOTE — Telephone Encounter (Signed)
Please  schedule fasting Labs : BMET,Lipids, hepatic panel, CBC & dif, TSH. V70.0 

## 2011-12-29 ENCOUNTER — Encounter: Payer: BC Managed Care – PPO | Admitting: Internal Medicine

## 2012-02-08 ENCOUNTER — Other Ambulatory Visit: Payer: Self-pay | Admitting: *Deleted

## 2012-02-08 MED ORDER — LEVOTHYROXINE SODIUM 25 MCG PO TABS: 25.0000 ug | ORAL_TABLET | Freq: Every day | ORAL | Status: DC

## 2012-02-08 NOTE — Telephone Encounter (Signed)
Rx sent 

## 2012-02-10 ENCOUNTER — Other Ambulatory Visit: Payer: Self-pay | Admitting: Internal Medicine

## 2012-02-14 ENCOUNTER — Encounter: Payer: BC Managed Care – PPO | Admitting: Internal Medicine

## 2012-03-11 ENCOUNTER — Other Ambulatory Visit: Payer: Self-pay | Admitting: Internal Medicine

## 2012-03-18 ENCOUNTER — Other Ambulatory Visit (INDEPENDENT_AMBULATORY_CARE_PROVIDER_SITE_OTHER): Payer: BC Managed Care – PPO

## 2012-03-18 DIAGNOSIS — E039 Hypothyroidism, unspecified: Secondary | ICD-10-CM

## 2012-03-18 DIAGNOSIS — T887XXA Unspecified adverse effect of drug or medicament, initial encounter: Secondary | ICD-10-CM

## 2012-03-18 DIAGNOSIS — E785 Hyperlipidemia, unspecified: Secondary | ICD-10-CM

## 2012-03-18 LAB — CBC WITH DIFFERENTIAL/PLATELET
Basophils Absolute: 0 10*3/uL (ref 0.0–0.1)
Lymphocytes Relative: 31.7 % (ref 12.0–46.0)
Lymphs Abs: 1.7 10*3/uL (ref 0.7–4.0)
Monocytes Relative: 11.9 % (ref 3.0–12.0)
Neutrophils Relative %: 52.8 % (ref 43.0–77.0)
Platelets: 183 10*3/uL (ref 150.0–400.0)
RDW: 13.4 % (ref 11.5–14.6)

## 2012-03-18 LAB — BASIC METABOLIC PANEL
BUN: 15 mg/dL (ref 6–23)
Chloride: 108 mEq/L (ref 96–112)
Creatinine, Ser: 1.2 mg/dL (ref 0.4–1.5)
Potassium: 4.7 mEq/L (ref 3.5–5.1)
Sodium: 142 mEq/L (ref 135–145)

## 2012-03-18 LAB — LIPID PANEL
Cholesterol: 169 mg/dL (ref 0–200)
Triglycerides: 209 mg/dL — ABNORMAL HIGH (ref 0.0–149.0)

## 2012-03-18 LAB — HEPATIC FUNCTION PANEL
Bilirubin, Direct: 0.1 mg/dL (ref 0.0–0.3)
Total Bilirubin: 0.8 mg/dL (ref 0.3–1.2)
Total Protein: 6.9 g/dL (ref 6.0–8.3)

## 2012-03-18 LAB — TSH: TSH: 4.72 u[IU]/mL (ref 0.35–5.50)

## 2012-03-19 LAB — LDL CHOLESTEROL, DIRECT: Direct LDL: 90 mg/dL

## 2012-03-26 ENCOUNTER — Encounter: Payer: Self-pay | Admitting: Internal Medicine

## 2012-03-26 ENCOUNTER — Encounter: Payer: Self-pay | Admitting: Gastroenterology

## 2012-03-26 ENCOUNTER — Ambulatory Visit (INDEPENDENT_AMBULATORY_CARE_PROVIDER_SITE_OTHER): Payer: BC Managed Care – PPO | Admitting: Internal Medicine

## 2012-03-26 VITALS — BP 110/70 | HR 72 | Temp 98.2°F | Resp 12 | Ht 72.0 in | Wt 214.0 lb

## 2012-03-26 DIAGNOSIS — K621 Rectal polyp: Secondary | ICD-10-CM

## 2012-03-26 DIAGNOSIS — Z0000 Encounter for general adult medical examination without abnormal findings: Secondary | ICD-10-CM

## 2012-03-26 DIAGNOSIS — E039 Hypothyroidism, unspecified: Secondary | ICD-10-CM

## 2012-03-26 DIAGNOSIS — K620 Anal polyp: Secondary | ICD-10-CM

## 2012-03-26 MED ORDER — LEVOTHYROXINE SODIUM 25 MCG PO TABS: ORAL_TABLET | ORAL | Status: DC

## 2012-03-26 NOTE — Progress Notes (Signed)
  Subjective:    Patient ID: Chase Hill, male    DOB: 1946-08-11, 66 y.o.   MRN: [id number]  HPI  Chase Hill  is here for a physical;acute issues include tennis elbow.       Review of Systems Patient reports no  vision/ hearing changes,anorexia, weight change, fever ,adenopathy, persistant / recurrent hoarseness, swallowing issues, chest pain,palpitations, edema,persistant / recurrent cough, hemoptysis, dyspnea(rest, exertional, paroxysmal nocturnal), gastrointestinal  bleeding (melena, rectal bleeding), abdominal pain, excessive heart burn, GU symptoms( dysuria, hematuria, pyuria, voiding/incontinence  issues) syncope, focal weakness, memory loss,numbness & tingling, skin/hair/nail changes,depression, anxiety,or  abnormal bruising/bleeding.     Objective:   Physical Exam Gen.: Healthy and well-nourished in appearance. Alert, appropriate and cooperative throughout exam. Head: Normocephalic without obvious abnormalities;  pattern alopecia  Eyes: No corneal or conjunctival inflammation noted. Pupils equal round reactive to light and accommodation. Fundal exam is benign without hemorrhages, exudate, papilledema. Extraocular motion intact. Vision grossly normal with lenses. Ears: External  ear exam reveals no significant lesions or deformities. Canals clear .TMs normal. Hearing is grossly decreased slightly on L. Nose: External nasal exam reveals no deformity or inflammation. Nasal mucosa are pink and moist. No lesions or exudates noted.  Mouth: Oral mucosa and oropharynx reveal no lesions or exudates. Teeth in good repair. Neck: No deformities, masses, or tenderness noted. Range of motion & Thyroid normal Lungs: Normal respiratory effort; chest expands symmetrically. Lungs are clear to auscultation without rales, wheezes, or increased work of breathing. Heart: Normal rate and rhythm. Normal S1 and S2. No gallop, click, or rub. Grade 1/2 systolic  murmur. Abdomen: Bowel sounds normal; abdomen  soft and nontender. No masses, organomegaly or hernias noted. Genitalia/DRE: Genitalia normal except for small left varices .At 6:00 there is a small firm polypoid structure which is not significantly tender. Prostate is normal without enlargement, asymmetry, nodularity, or induration.  Musculoskeletal/extremities: No deformity or scoliosis noted of  the thoracic or lumbar spine. No clubbing, cyanosis, edema, or deformity noted. Range of motion  normal .Tone & strength  normal.Joints normal. Nail health  good. Pronation testing for tenosynovitis was positive on the left Vascular: Carotid, radial artery, dorsalis pedis and  posterior tibial pulses are full and equal. No bruits present. Neurologic: Alert and oriented x3. Deep tendon reflexes symmetrical and normal.          Skin: Intact without suspicious lesions or rashes. Lymph: No cervical, axillary, or inguinal lymphadenopathy present. Psych: Mood and affect are normal. Normally interactive                                                                                         Assessment & Plan:  #1 comprehensive physical exam  #2 tenosynovitis left elbow  #3 questionable small rectal polyp  #4 hypertriglyceridemia   Plan: see Orders and recommendations

## 2012-03-26 NOTE — Patient Instructions (Addendum)
Preventive Health Care: Exercise at least 30-45 minutes a day,  3-4 days a week.  Eat a low-fat diet with lots of fruits and vegetables, up to 7-9 servings per day.  Consume less than 40 grams (preferably ZERO) of sugar per day from foods & drinks with High Fructose Corn Sugar as # 1,2,3 or # 4 on label. PLEASE BRING THESE INSTRUCTIONS TO FOLLOW UP  LAB APPOINTMENT in 10-12 weeks for lipids & TSH.This will guarantee correct labs are drawn, eliminating need for repeat blood sampling ( needle sticks ! ). Diagnoses /Codes:  272.4,244.9 Perform the exercises for elbow pain  twice a day as discussed. Apply the anti-inflammatory gel after the exercises.  Please try to go on My Chart  to allow me to release the results directly to you.

## 2012-04-18 ENCOUNTER — Ambulatory Visit: Payer: BC Managed Care – PPO | Admitting: Gastroenterology

## 2012-04-26 ENCOUNTER — Ambulatory Visit (INDEPENDENT_AMBULATORY_CARE_PROVIDER_SITE_OTHER): Payer: BC Managed Care – PPO | Admitting: Gastroenterology

## 2012-04-26 ENCOUNTER — Encounter: Payer: Self-pay | Admitting: Gastroenterology

## 2012-04-26 VITALS — BP 136/80 | HR 62 | Ht 72.0 in | Wt 216.0 lb

## 2012-04-26 DIAGNOSIS — K5730 Diverticulosis of large intestine without perforation or abscess without bleeding: Secondary | ICD-10-CM

## 2012-04-26 DIAGNOSIS — K648 Other hemorrhoids: Secondary | ICD-10-CM

## 2012-04-26 DIAGNOSIS — K644 Residual hemorrhoidal skin tags: Secondary | ICD-10-CM

## 2012-04-26 NOTE — Progress Notes (Signed)
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 He denies any GI complaints. Specifically, he has regular bowel movements without melena, hematochezia, abdominal rectal pain. Colonoscopy 5 years ago was unremarkable except for mild diverticulosis. Patient does follow a high fiber diet and uses fiber supplements. There is no family history of colon cancer. He specifically denies rectal bleeding, melena, or systemic, upper GI or hepatobiliary complaints. Previous colonoscopy did reveal some internal hemorrhoids.  Current Medications, Allergies, Past Medical History, Past Surgical History, Family History and Social History were reviewed in [employer] record.  Pertinent Review of Systems Negative   Physical Exam: Healthy patient in no distress. Blood pressure 136/80, pulse 62 and regular, weight 216 pounds the BMI of 29.29. Abdominal exam shows no organomegaly, masses or tenderness. Inspection the rectum shows a small posterior skin tag. Rectal exam shows no masses or tenderness with guaiac negative stool.  Anoscopy: Endoscopic 4 quadrant exam was completed without difficulty. Exam was unremarkable except for posterior internal hemorrhoid which did not appear inflamed or bleeding. I cannot appreciate any fissures, fistula, or mucosal lesions. Patient tolerated this procedure well.  Assessment and Plan: Posterior skin tag / posterior internal hemorrhoid. The patient is asymptomatic, has guaiac negative stools, and recent colonoscopy. I've asked to continue his current regime of a high fiber diet with liberal by mouth fluids. I do not think he needs colonoscopy at this time unless other problems ensue. He is comfortable with this opinion and plan. He is to continue other medications as outlined by Dr. Alwyn Ren. No diagnosis found.

## 2012-11-15 ENCOUNTER — Encounter: Payer: Self-pay | Admitting: Internal Medicine

## 2012-11-15 ENCOUNTER — Ambulatory Visit (INDEPENDENT_AMBULATORY_CARE_PROVIDER_SITE_OTHER): Payer: BC Managed Care – PPO | Admitting: Internal Medicine

## 2012-11-15 ENCOUNTER — Ambulatory Visit: Payer: BC Managed Care – PPO | Admitting: Family Medicine

## 2012-11-15 ENCOUNTER — Telehealth: Payer: Self-pay | Admitting: *Deleted

## 2012-11-15 VITALS — BP 128/78 | HR 65 | Temp 98.1°F | Wt 216.0 lb

## 2012-11-15 DIAGNOSIS — R9431 Abnormal electrocardiogram [ECG] [EKG]: Secondary | ICD-10-CM

## 2012-11-15 DIAGNOSIS — K219 Gastro-esophageal reflux disease without esophagitis: Secondary | ICD-10-CM

## 2012-11-15 DIAGNOSIS — R079 Chest pain, unspecified: Secondary | ICD-10-CM

## 2012-11-15 DIAGNOSIS — E785 Hyperlipidemia, unspecified: Secondary | ICD-10-CM

## 2012-11-15 LAB — LDL CHOLESTEROL, DIRECT: Direct LDL: 121.1 mg/dL

## 2012-11-15 LAB — LIPID PANEL
HDL: 35.8 mg/dL — ABNORMAL LOW (ref >39.00)
Triglycerides: 288 mg/dL — ABNORMAL HIGH (ref 0.0–149.0)
VLDL: 57.6 mg/dL — ABNORMAL HIGH (ref 0.0–40.0)

## 2012-11-15 LAB — TROPONIN I: Troponin I: <0.01 ng/mL (ref <0.06)

## 2012-11-15 LAB — TSH: TSH: 2.24 u[IU]/mL (ref 0.35–5.50)

## 2012-11-15 LAB — HEPATIC FUNCTION PANEL
Albumin: 3.8 g/dL (ref 3.5–5.2)
Alkaline Phosphatase: 55 U/L (ref 39–117)

## 2012-11-15 MED ORDER — RANITIDINE HCL 150 MG PO TABS: 150.0000 mg | ORAL_TABLET | Freq: Two times a day (BID) | ORAL | Status: DC

## 2012-11-15 NOTE — Telephone Encounter (Signed)
Patients wife called back, pt is refusing to see anyone but Dr. Alwyn Ren, spoke with Dr. Alwyn Ren, he will see the pt today at 12:15.

## 2012-11-15 NOTE — Progress Notes (Signed)
  Subjective:    Patient ID: Chase Hill, male    DOB: 27-May-1946, 67 y.o.   MRN: [id number]  HPI  He describes dull substernal discomfort which awoke him at 2:30 AM the morning of 4/3. He had eaten about 6:30 pm.There was some associated discomfort in the left upper extremity. There was no associated palpitations or nausea and sweating. The pain lasted  several minutes and resolved without treatment. It also  recurred last night to lesser degree. He has been involved in prolonged air & automobile travel last week. The pain was not aggravated by position change, specifically deep breathing or coughing did not aggravate the pain.   As of 01/17/2007 his EKG demonstrates bifascicular block with right bundle branch block and left posterior fascicular block.    Past medical history/family history/social history were all reviewed and updated. PMH positive  for GERD FH  CAD  & CVA ,but not premature Never smoked .                Review of Systems  He "lives with" dyspepsia & reflux . TUMS helped symptoms.No dysphagia, abdominal pain, unexplained weight loss, melena, or  rectal bleeding. There is no associated cough, sputum production, hemoptysis ,or radicular component to the chest pain Paroxysmal nocturnal dyspnea is not present Claudication or rest calf pain  is not described.Edema is not present Associated dizziness and syncope are not described. There is no associated rash, color change,or temperature change in the area of the pain        Objective:   Physical Exam Appears healthy and well-nourished & in no acute distress  No carotid bruits are present.No neck pain distention present at 10 - 15 degrees. Thyroid normal to palpation  Heart rhythm and rate are normal with no gallop. Grade 1/2 -1 over 6 systolic murmur  Chest is clear with no increased work of breathing  There is no evidence of aortic aneurysm or renal artery bruits  Abdomen soft with no organomegaly or  masses. No HJR  No clubbing, cyanosis or edema present.  Pedal pulses are intact   Homan's negative  No ischemic skin changes are present . Nails healthy   Alert and oriented. Strength, tone, DTRs reflexes normal          Assessment & Plan:  #1 chest pain at rest; most likely cause is reflux and possible hiatal hernia. Of concern is his pre-existing bifascicular block and recent prolonged travel.  #2 abnormal EKG, stable  #3 reflux  Plan: See recommendations

## 2012-11-15 NOTE — Telephone Encounter (Signed)
Spoke with wife who states that pt told her this morning that he has been having some chest discomfort for 2 days and is refusing to go to the ER or UC. I advised that we could see him this afternoon but made wife aware that if when he arrives if we felt this pain needed a more extensice work-up then we would contact EMS to have him transported to the hospital. She verbalized understanding of this.

## 2012-11-15 NOTE — Patient Instructions (Addendum)
Reflux of gastric acid may be asymptomatic as this may occur mainly during sleep.The triggers for reflux  include stress; the "aspirin family" ; alcohol; peppermint; and caffeine (coffee, tea, cola, and chocolate). The aspirin family would include aspirin and the nonsteroidal agents such as ibuprofen &  Naproxen. Tylenol would not cause reflux. If having symptoms ; food & drink should be avoided for @ least 2 hours before going to bed. Take the EKG to any emergency room or preop visits. There are chronic changes; as long as there is no new change these are not clinically significant . If the old EKG is not available for comparison; it may result in unnecessary hospitalization for observation with significant unnecessary expense.   If you activate the  My Chart system; lab & Xray results will be released directly  to you as soon as I review & address these through the computer. If you choose not to sign up for My Chart within 36 hours of labs being drawn; results will be reviewed & interpretation added before being copied & mailed, causing a delay in getting the results to you.If you do not receive that report within 7-10 days ,please call. Additionally you can use this system to gain direct  access to your records  if  out of town or @ an office of a  physician who is not in  the My Chart network.  This improves continuity of care & places you in control of your medical record.

## 2012-11-21 ENCOUNTER — Encounter: Payer: Self-pay | Admitting: Cardiology

## 2012-11-21 ENCOUNTER — Ambulatory Visit (INDEPENDENT_AMBULATORY_CARE_PROVIDER_SITE_OTHER): Payer: BC Managed Care – PPO | Admitting: Cardiology

## 2012-11-21 VITALS — BP 110/68 | HR 68 | Ht 72.0 in | Wt 214.0 lb

## 2012-11-21 DIAGNOSIS — R079 Chest pain, unspecified: Secondary | ICD-10-CM

## 2012-11-21 DIAGNOSIS — E785 Hyperlipidemia, unspecified: Secondary | ICD-10-CM

## 2012-11-21 DIAGNOSIS — E7800 Pure hypercholesterolemia, unspecified: Secondary | ICD-10-CM

## 2012-11-21 MED ORDER — PRAVASTATIN SODIUM 40 MG PO TABS: 40.0000 mg | ORAL_TABLET | Freq: Every evening | ORAL | Status: DC

## 2012-11-21 NOTE — Patient Instructions (Addendum)
Increase pravastatin to 40mg  daily in the evening. You can take 2 of your 20mg  tablets at the same time and use your current supply.  Start aspirin 81mg  daily.  Your physician has requested that you have an echocardiogram. Echocardiography is a painless test that uses sound waves to create images of your heart. It provides your doctor with information about the size and shape of your heart and how well your heart's chambers and valves are working. This procedure takes approximately one hour. There are no restrictions for this procedure.  Your physician recommends that you schedule a follow-up appointment in: 2 months with Dr Shirlee Latch.   Your physician recommends that you return for a FASTING lipid profile /liver profile in 2 months when you see Dr Shirlee Latch.

## 2012-11-22 DIAGNOSIS — R079 Chest pain, unspecified: Secondary | ICD-10-CM | POA: Insufficient documentation

## 2012-11-22 HISTORY — DX: Chest pain, unspecified: R07.9

## 2012-11-22 NOTE — Progress Notes (Signed)
Patient ID: Chase Hill, male   DOB: 04-25-1946, 67 y.o.   MRN: [id number] PCP: Dr. Alwyn Ren  67 yo with history of hyperlipidemia and GERD presents for evaluation of chest pain.  About a week ago, patient woke up in the middle of the night with central chest burning.  This lasted for about an hour.  This felt different to him than his typical reflux.  He additionally felt left arm numbness (but he had been sleeping on his left side).  He only had the one severe episode, but he has had periodic chest burning in the evenings after dinner.  He saw Dr. Alwyn Ren and was started on ranitidine.  He has had no more chest burning since starting ranitidine.   When he had the severe episode, he had been on vacation and had been eating well and drinking wine.  He has had no exertional chest pain.  He walks the golf course 2 times a week with no problems.  No exertional dyspnea.    He had a prior chest pain workup in 2011.  At that time, he had an ETT-myoview that showed inferior ischemia.  LHC was then done, showing no obstructive CAD.  The Myoview was thought to be falsely positive.   ECG: NSR, RBBB, LPFB  Labs (4/14): HDL 36, LDL 121, D dimer negative, TSH normal  PMH: 1. GERD 2. Hyperlipidemia 3. Hypothyroidism 4. Chronic RBBB 5. Chest pain: ETT-myoview in 2/11 with inferior ischemia.  Echo with EF > 55%, no valvular abnormalities.  Patient had LHC in 2/11 with no significant coronary disease (false positive stress test).   SH: Nonsmoker, lives in [place], married, Clinical research associate  FH: Father died at 66 with CHF.   ROS: All systems reviewed and negative except as per HPI.   Current Outpatient Prescriptions  Medication Sig Dispense Refill  . levothyroxine (SYNTHROID, LEVOTHROID) 25 MCG tablet One daily except 1-1/2 Tuesday and Thursday  100 tablet  3  . Loratadine (CLARITIN) 10 MG CAPS Take by mouth.      . Multiple Vitamin (MULTIVITAMIN) tablet Take 1 tablet by mouth daily.        . ranitidine  (ZANTAC) 150 MG tablet Take 1 tablet (150 mg total) by mouth 2 (two) times daily.  60 tablet  2  . aspirin EC 81 MG tablet Take 1 tablet (81 mg total) by mouth daily.      . pravastatin (PRAVACHOL) 40 MG tablet Take 1 tablet (40 mg total) by mouth every evening.  90 tablet  3   No current facility-administered medications for this visit.    BP 110/68  Pulse 68  Ht 6' (1.829 m)  Wt 214 lb (97.07 kg)  BMI 29.02 kg/m2 General: NAD Neck: No JVD, no thyromegaly or thyroid nodule.  Lungs: Clear to auscultation bilaterally with normal respiratory effort. CV: Nondisplaced PMI.  Heart regular S1/S2, no S3/S4, no murmur.  No peripheral edema.  No carotid bruit.  Normal pedal pulses.  Abdomen: Soft, nontender, no hepatosplenomegaly, no distention.  Skin: Intact without lesions or rashes.  Neurologic: Alert and oriented x 3.  Psych: Normal affect. Extremities: No clubbing or cyanosis.  HEENT: Normal.    Assessment/Plan: 1. Chest pain: Very atypical.  From the history, his chest pain sounds like GERD.  It has resolved completely since starting ranitidine.  No exertional symptoms.  We could consider doing a stress test, but he had a false positive myoview in 2011, so given the very atypical symptoms, I will try  to avoid this for now.   - Echocardiogram to assess LV systolic function. - Start ASA 81 mg daily.  - Followup in 2 months.  He will call if symptoms return.  2. Hyperlipidemia: I will increase pravastatin to 40 mg daily given LDL 121 and will get lipids/LFTs in 2 months.    Marca Ancona 11/22/2012 11:35 PM

## 2012-11-25 ENCOUNTER — Ambulatory Visit (HOSPITAL_COMMUNITY): Payer: BC Managed Care – PPO | Attending: Cardiology

## 2012-11-25 DIAGNOSIS — E7800 Pure hypercholesterolemia, unspecified: Secondary | ICD-10-CM

## 2012-11-25 DIAGNOSIS — R072 Precordial pain: Secondary | ICD-10-CM

## 2012-11-25 DIAGNOSIS — R079 Chest pain, unspecified: Secondary | ICD-10-CM | POA: Insufficient documentation

## 2012-11-25 NOTE — Progress Notes (Signed)
Echocardiogram performed.  

## 2013-01-14 ENCOUNTER — Encounter: Payer: Self-pay | Admitting: *Deleted

## 2013-01-15 ENCOUNTER — Ambulatory Visit (INDEPENDENT_AMBULATORY_CARE_PROVIDER_SITE_OTHER): Payer: BC Managed Care – PPO | Admitting: Cardiology

## 2013-01-15 ENCOUNTER — Other Ambulatory Visit (INDEPENDENT_AMBULATORY_CARE_PROVIDER_SITE_OTHER): Payer: BC Managed Care – PPO

## 2013-01-15 ENCOUNTER — Encounter: Payer: Self-pay | Admitting: Cardiology

## 2013-01-15 VITALS — BP 108/68 | HR 69 | Ht 72.0 in | Wt 216.0 lb

## 2013-01-15 DIAGNOSIS — I421 Obstructive hypertrophic cardiomyopathy: Secondary | ICD-10-CM

## 2013-01-15 DIAGNOSIS — I422 Other hypertrophic cardiomyopathy: Secondary | ICD-10-CM

## 2013-01-15 DIAGNOSIS — R079 Chest pain, unspecified: Secondary | ICD-10-CM

## 2013-01-15 DIAGNOSIS — R0989 Other specified symptoms and signs involving the circulatory and respiratory systems: Secondary | ICD-10-CM

## 2013-01-15 HISTORY — DX: Obstructive hypertrophic cardiomyopathy: I42.1

## 2013-01-15 LAB — BASIC METABOLIC PANEL
BUN: 17 mg/dL (ref 6–23)
Calcium: 9 mg/dL (ref 8.4–10.5)
Creatinine, Ser: 1.2 mg/dL (ref 0.4–1.5)
GFR: 63.28 mL/min (ref >60.00)
Glucose, Bld: 92 mg/dL (ref 70–99)

## 2013-01-15 NOTE — Progress Notes (Signed)
Patient ID: YUTAKA HOLBERG, male   DOB: 10-Aug-1946, 67 y.o.   MRN: [id number] PCP: Dr. Alwyn Ren  67 yo with history of hyperlipidemia and GERD presented initially for evaluation of an episode of quite atypical chest pain that seemed GI-related.  He has had no exertional chest pain.  He walks the golf course 2 times a week with no problems.  No exertional dyspnea.  He had a prior chest pain workup in 2011.  At that time, he had an ETT-myoview that showed inferior ischemia.  LHC was then done, showing no obstructive CAD.  The Myoview was thought to be falsely positive.   Since that appointment, he has had no recurrent chest pain.  He is quite active.  I did not set him up for a stress test given false positive test in the past.  I wanted to see if he had any more episodes of pain.  I did have him do an echocardiogram.  This was abnormal, showing basal septal hypertrophy with mild LV outflow tract gradient and chordal versus valvular SAM, trivial MR, and moderate diastolic dysfunction.  Patient denies tachypalpitations or syncope/presyncope.  Also at last appointment, I tried to have him increase his pravastatin to 40 since his LDL was still elevated, but he was unable to tolerate due to myalgias.    ECG: NSR, RBBB, LPFB  Labs (4/14): HDL 36, LDL 121, D dimer negative, TSH normal  PMH: 1. GERD 2. Hyperlipidemia 3. Hypothyroidism 4. Chronic RBBB 5. Chest pain: ETT-myoview in 2/11 with inferior ischemia.  Echo with EF > 55%, no valvular abnormalities.  Patient had LHC in 2/11 with no significant coronary disease (false positive stress test). 6. Possible hypertrophic cardiomyopathy: Echo (4/14) with EF 55-60%, focal basal septal hypertrophy with mild LV outflow tract obstruction/gradient, grade II diastolic dysfunction, valvular versus chordal SAM (hard to tell), trivial MR.    SH: Nonsmoker, lives in [place], married, Clinical research associate  FH: Father died at 69 with CHF.   ROS: All systems reviewed and  negative except as per HPI.   Current Outpatient Prescriptions  Medication Sig Dispense Refill  . aspirin EC 81 MG tablet Take 1 tablet (81 mg total) by mouth daily.      Marland Kitchen levothyroxine (SYNTHROID, LEVOTHROID) 25 MCG tablet One daily except 1-1/2 Tuesday and Thursday  100 tablet  3  . Loratadine (CLARITIN) 10 MG CAPS Take by mouth.      . Multiple Vitamin (MULTIVITAMIN) tablet Take 1 tablet by mouth daily.        . pravastatin (PRAVACHOL) 40 MG tablet Take 20 mg by mouth every evening.      . ranitidine (ZANTAC) 150 MG tablet Take 1 tablet (150 mg total) by mouth 2 (two) times daily.  60 tablet  2   No current facility-administered medications for this visit.    BP 108/68  Pulse 69  Ht 6' (1.829 m)  Wt 216 lb (97.977 kg)  BMI 29.29 kg/m2  SpO2 96% General: NAD Neck: No JVD, no thyromegaly or thyroid nodule.  Lungs: Clear to auscultation bilaterally with normal respiratory effort. CV: Nondisplaced PMI.  Heart regular S1/S2, no S3/S4, 1/6 SEM.  No peripheral edema.  No carotid bruit.  Normal pedal pulses.  Abdomen: Soft, nontender, no hepatosplenomegaly, no distention.  Neurologic: Alert and oriented x 3.  Psych: Normal affect. Extremities: No clubbing or cyanosis.   Assessment/Plan: 1. Chest pain: Very atypical with no recurrence.  From the history, his chest pain sounds like GERD.  It  has resolved completely since starting ranitidine.  No exertional symptoms.  I decided not to do a stress test given very atypical symptoms and prior history of false positive myoview.  2. Hyperlipidemia: He was unable to tolerate pravastatin 40 so will continue on pravastatin 20.  3. Possible hypertrophic cardiomyopathy: From echo, I am concerned for a HCM variant.  Most likely this is a relatively benign variant as he has been totally asymptomatic.  He has a mild LVOT gradient with mild ejection murmur on exam.  I will get a cardiac MRI to get a closer look at his heart's morphology and assess for any  scarring in the septum.  If cMRI seems to confirm my impression from the echo, I would suggest that he take a beta blocker, Toprol XL 25 mg daily.  In that case, his children should at the least get screening echoes .    Marca Ancona 01/15/2013 4:30 PM

## 2013-01-15 NOTE — Patient Instructions (Addendum)
Your physician recommends that you have  lab work today--BMET   Your physician has requested that you have a cardiac MRI. Cardiac MRI uses a computer to create images of your heart as its beating, producing both still and moving pictures of your heart and major blood vessels. For further information please visit [email or web address]. Please follow the instruction sheet given to you today for more information. Dr Shirlee Latch will call you with the results a few days after the test has been done. If it is OK he will plan to see you in 1 year.   Your physician wants you to follow-up in: 1 year with Dr Shirlee Latch. (June 2015).You will receive a reminder letter in the mail two months in advance. If you don't receive a letter, please call our office to schedule the follow-up appointment.

## 2013-01-17 ENCOUNTER — Telehealth: Payer: Self-pay | Admitting: Cardiology

## 2013-01-17 NOTE — Telephone Encounter (Signed)
Message copied by Burnell Blanks on Fri Jan 17, 2013  3:44 PM ------      Message from: Laurey Morale      Created: Fri Jan 17, 2013 11:15 AM       Labs ok ------

## 2013-01-17 NOTE — Telephone Encounter (Signed)
New Problem:    Patient called in returning your call regarding his latest labs.  Please call back.

## 2013-01-17 NOTE — Telephone Encounter (Signed)
Left message to call back  

## 2013-01-21 ENCOUNTER — Encounter: Payer: Self-pay | Admitting: Cardiology

## 2013-01-21 NOTE — Telephone Encounter (Signed)
LMTCB

## 2013-01-21 NOTE — Telephone Encounter (Signed)
Follow-up:    Patient called in returning Melinda's call regarding his lab results

## 2013-01-22 NOTE — Telephone Encounter (Signed)
Spoke with pt about recent lab results 

## 2013-01-22 NOTE — Telephone Encounter (Signed)
Follow up ° ° °Pt returning your call °

## 2013-01-29 ENCOUNTER — Ambulatory Visit (HOSPITAL_COMMUNITY): Payer: BC Managed Care – PPO

## 2013-01-31 ENCOUNTER — Ambulatory Visit (HOSPITAL_COMMUNITY)
Admission: RE | Admit: 2013-01-31 | Discharge: 2013-01-31 | Disposition: A | Discharge status: Home / Self Care | Payer: BC Managed Care – PPO | Source: Ambulatory Visit | Attending: Cardiology | Admitting: Cardiology

## 2013-01-31 DIAGNOSIS — I422 Other hypertrophic cardiomyopathy: Secondary | ICD-10-CM | POA: Insufficient documentation

## 2013-01-31 MED ORDER — GADOBENATE DIMEGLUMINE 529 MG/ML IV SOLN
20.0000 mL | Freq: Once | INTRAVENOUS | Status: AC
  Administered 2013-01-31: 30 mL via INTRAVENOUS

## 2013-02-03 DIAGNOSIS — I421 Obstructive hypertrophic cardiomyopathy: Secondary | ICD-10-CM

## 2013-02-07 ENCOUNTER — Other Ambulatory Visit: Payer: Self-pay | Admitting: Internal Medicine

## 2013-02-07 ENCOUNTER — Telehealth: Payer: Self-pay | Admitting: Cardiology

## 2013-02-07 DIAGNOSIS — K219 Gastro-esophageal reflux disease without esophagitis: Secondary | ICD-10-CM

## 2013-02-07 NOTE — Telephone Encounter (Signed)
Spoke with pharmacy to confirm no duplication of Zantac Rx was sent. Clarified only one Rx was to be filled.

## 2013-02-07 NOTE — Telephone Encounter (Signed)
Refill done per protocol.  

## 2013-02-07 NOTE — Telephone Encounter (Signed)
Refill already addressed. Spoke with pharmacy this morning to clarify only one rx was to be filled.

## 2013-02-07 NOTE — Telephone Encounter (Signed)
New problem    Pt is wanting the results of his MRI

## 2013-02-07 NOTE — Telephone Encounter (Signed)
Reviewed results of MRI with patient which included a note from Dr. Shirlee Latch that he will discuss treatment with patient when he returns from vacation; patient verbalized understanding.  Patient states he is on vacation as well and will look forward to talking with Dr. Shirlee Latch next week.

## 2013-02-11 ENCOUNTER — Other Ambulatory Visit: Payer: Self-pay | Admitting: *Deleted

## 2013-02-11 MED ORDER — METOPROLOL SUCCINATE ER 25 MG PO TB24: 25.0000 mg | ORAL_TABLET | Freq: Every day | ORAL | Status: DC

## 2013-02-11 MED ORDER — METOPROLOL SUCCINATE ER 25 MG PO TB24: ORAL_TABLET | ORAL | Status: DC

## 2013-02-11 NOTE — Telephone Encounter (Signed)
PT AWARE SCRIPT SENT TO PHARMACY .Chase Hill

## 2013-02-11 NOTE — Telephone Encounter (Signed)
Message copied by Alois Cliche on Tue Feb 11, 2013  1:36 PM ------      Message from: Laurey Morale      Created: Tue Feb 11, 2013  1:11 PM       Please call in Toprol XL 12.5 mg daily, 30 day supply 6 refills for Mr Achterberg to [facility].  Please call him when this is sent in.  ------

## 2013-02-12 ENCOUNTER — Telehealth: Payer: Self-pay | Admitting: *Deleted

## 2013-02-12 MED ORDER — METOPROLOL SUCCINATE ER 25 MG PO TB24: ORAL_TABLET | ORAL | Status: DC

## 2013-02-12 NOTE — Telephone Encounter (Signed)
cLean, Eliot Ford, MD ','<<< Less Detail       Laurey Morale, MD  Chase Hill     MRN: [id number] DOB: 1945/11/18     Pt Work: [phone] Pt Home: [phone]   Sent: Tue February 11, 2013 1:11 PM     To: [facility]     Cc: Jacqlyn Krauss, RN                          Message    Please call in Toprol XL 12.5 mg daily, 30 day supply 6 refills for [name] to [facility]. Please call him when this is sent in.    cLean, Eliot Ford, MD ','<<< Less Detail       Laurey Morale, MD  Chase Hill     MRN: [id number] DOB: [birth year]     Pt Work: [phone] Pt Home: [phone]   Sent: Tue February 11, 2013 1:11 PM     To: [facility]     Cc: Jacqlyn Krauss, RN                          Message    Please call in Toprol XL 12.5 mg daily, 30 day supply 6 refills for [name] to [facility]. Please call him when this is sent in.

## 2013-02-12 NOTE — Telephone Encounter (Signed)
Pt.notified

## 2013-03-19 ENCOUNTER — Other Ambulatory Visit: Payer: Self-pay

## 2013-03-26 ENCOUNTER — Telehealth: Payer: Self-pay | Admitting: Internal Medicine

## 2013-03-26 DIAGNOSIS — T887XXA Unspecified adverse effect of drug or medicament, initial encounter: Secondary | ICD-10-CM

## 2013-03-26 DIAGNOSIS — Z0000 Encounter for general adult medical examination without abnormal findings: Secondary | ICD-10-CM

## 2013-03-26 DIAGNOSIS — E785 Hyperlipidemia, unspecified: Secondary | ICD-10-CM

## 2013-03-26 NOTE — Telephone Encounter (Signed)
Patient returned call indicating insurance company covers labs.

## 2013-03-26 NOTE — Telephone Encounter (Signed)
Left message on VM informing patient labs may not be covered prior to appointment, patient to check with insurance company and call back to advise

## 2013-03-26 NOTE — Telephone Encounter (Signed)
Patient has an appointment on 04/02/13 and wants to go over to [place] on Friday to have labs drawn before this visit. Please enter orders.

## 2013-03-28 ENCOUNTER — Other Ambulatory Visit (INDEPENDENT_AMBULATORY_CARE_PROVIDER_SITE_OTHER): Payer: BC Managed Care – PPO

## 2013-03-28 DIAGNOSIS — Z0000 Encounter for general adult medical examination without abnormal findings: Secondary | ICD-10-CM

## 2013-03-28 DIAGNOSIS — T887XXA Unspecified adverse effect of drug or medicament, initial encounter: Secondary | ICD-10-CM

## 2013-03-28 DIAGNOSIS — E785 Hyperlipidemia, unspecified: Secondary | ICD-10-CM

## 2013-03-28 LAB — LIPID PANEL
Cholesterol: 143 mg/dL (ref 0–200)
Total CHOL/HDL Ratio: 4
Triglycerides: 202 mg/dL — ABNORMAL HIGH (ref 0.0–149.0)

## 2013-03-28 LAB — CBC WITH DIFFERENTIAL/PLATELET
Eosinophils Relative: 6 % — ABNORMAL HIGH (ref 0.0–5.0)
HCT: 42.6 % (ref 39.0–52.0)
Hemoglobin: 14.6 g/dL (ref 13.0–17.0)
Lymphs Abs: 1.7 10*3/uL (ref 0.7–4.0)
Monocytes Relative: 10.5 % (ref 3.0–12.0)
Neutro Abs: 3.6 10*3/uL (ref 1.4–7.7)
RBC: 4.69 Mil/uL (ref 4.22–5.81)
WBC: 6.4 10*3/uL (ref 4.5–10.5)

## 2013-03-28 LAB — BASIC METABOLIC PANEL
GFR: 67.74 mL/min (ref >60.00)
Glucose, Bld: 95 mg/dL (ref 70–99)
Potassium: 4.4 mEq/L (ref 3.5–5.1)
Sodium: 141 mEq/L (ref 135–145)

## 2013-03-28 LAB — HEPATIC FUNCTION PANEL
ALT: 24 U/L (ref 0–53)
Total Bilirubin: 0.8 mg/dL (ref 0.3–1.2)
Total Protein: 7 g/dL (ref 6.0–8.3)

## 2013-04-02 ENCOUNTER — Ambulatory Visit (INDEPENDENT_AMBULATORY_CARE_PROVIDER_SITE_OTHER): Payer: BC Managed Care – PPO | Admitting: Internal Medicine

## 2013-04-02 ENCOUNTER — Encounter: Payer: Self-pay | Admitting: Internal Medicine

## 2013-04-02 VITALS — BP 100/80 | HR 58 | Temp 98.1°F | Ht 71.75 in | Wt 213.4 lb

## 2013-04-02 DIAGNOSIS — J309 Allergic rhinitis, unspecified: Secondary | ICD-10-CM

## 2013-04-02 DIAGNOSIS — K5730 Diverticulosis of large intestine without perforation or abscess without bleeding: Secondary | ICD-10-CM

## 2013-04-02 DIAGNOSIS — Z1331 Encounter for screening for depression: Secondary | ICD-10-CM

## 2013-04-02 DIAGNOSIS — E039 Hypothyroidism, unspecified: Secondary | ICD-10-CM

## 2013-04-02 DIAGNOSIS — Z0000 Encounter for general adult medical examination without abnormal findings: Secondary | ICD-10-CM

## 2013-04-02 DIAGNOSIS — R079 Chest pain, unspecified: Secondary | ICD-10-CM

## 2013-04-02 MED ORDER — LEVOTHYROXINE SODIUM 25 MCG PO TABS: ORAL_TABLET | ORAL | Status: DC

## 2013-04-02 NOTE — Patient Instructions (Addendum)
The most common cause of elevated triglycerides (TG) is the ingestion of sugar from high fructose corn syrup sources added to processed foods & drinks.  Eat a low-fat diet with lots of fruits and vegetables, up to 7-9 servings per day. Consume less than 40 Grams (preferably ZERO) of sugar per day from foods & drinks with High Fructose Corn Syrup (HFCS) sugar as #1,2,3 or # 4 on label.Whole Foods, [employer] & [employer] do not carry products with HFCS.   I asked Dr. Shirlee Latch to review your symptoms to see if a trial off the beta blocker is appropriate.

## 2013-04-02 NOTE — Progress Notes (Signed)
Subjective:    Patient ID: Chase Hill, male    DOB: August 26, 1945, 67 y.o.   MRN: [id number]  HPI  He is here for a physical;acute issues include postural dizziness with standing back up  after  waist flexion such as teeing up golf ball since being on low dose Beta blocker  for possible hypertrophic cardiomyopathy.    Review of Systems He is on a modified heart healthy diet; he exercises > 4 times per week as walking upw to 6 mpd  without symptoms. Specifically he denies chest pain, palpitations, dyspnea, or claudication.  Family history is negative for premature coronary disease. Advanced cholesterol testing reveals his LDL goal is less than 100, ideally < 70. There is medication compliance with the statin. Significant abdominal symptoms, memory deficit, or myalgias denied on 40 mg Pravastatin 1/2 daily.     Objective:   Physical Exam  Gen.: Healthy and well-nourished in appearance. Alert, appropriate and cooperative throughout exam. Appears younger than stated age  Head: Normocephalic without obvious abnormalities; pattern alopecia  Eyes: No corneal or conjunctival inflammation noted. Pupils equal round reactive to light and accommodation. Fundal exam is benign without hemorrhages, exudate, papilledema. Extraocular motion intact. Vision grossly normal with lenses Ears: External  ear exam reveals no significant lesions or deformities. Canals clear .TMs normal. Hearing is slightly decreased on L. Nose: External nasal exam reveals no deformity or inflammation. Nasal mucosa are pink and moist. No lesions or exudates noted.   Mouth: Oral mucosa and oropharynx reveal no lesions or exudates. Teeth in good repair. Neck: No deformities, masses, or tenderness noted. Range of motion & Thyroid normal. Lungs: Normal respiratory effort; chest expands symmetrically. Lungs are clear to auscultation without rales, wheezes, or increased work of breathing. Heart: Normal rate and rhythm. Normal S1 and S2.  No gallop, click, or rub. No murmur. Abdomen: Bowel sounds normal; abdomen soft and nontender. No masses, organomegaly or hernias noted. Genitalia: Genitalia normal except for left varices. Prostate is normal without enlargement, asymmetry, nodularity, or induration.                                  Musculoskeletal/extremities: No deformity or scoliosis noted of  the thoracic or lumbar spine.  No clubbing, cyanosis, edema, or significant extremity  deformity noted. Range of motion normal .Tone & strength  Normal. Joints normal . Nail health good. Able to lie down & sit up w/o help. Negative SLR bilaterally Vascular: Carotid, radial artery, dorsalis pedis and  posterior tibial pulses are full and equal. No bruits present. Neurologic: Alert and oriented x3. Deep tendon reflexes symmetrical and normal.        Skin: Intact without suspicious lesions or rashes. Lymph: No cervical, axillary, or inguinal lymphadenopathy present. Psych: Mood and affect are normal. Normally interactive                                                                                         Assessment & Plan:  #1 comprehensive physical exam; no acute findings  #2 postural lightheadedness related to low-dose beta blocker subjectively  Plan: see Orders  & Recommendations

## 2013-04-03 ENCOUNTER — Telehealth: Payer: Self-pay | Admitting: *Deleted

## 2013-04-03 NOTE — Telephone Encounter (Signed)
Message copied by Verdie Shire on Thu Apr 03, 2013  5:05 PM ------      Message from: Pecola Lawless      Created: Wed Apr 02, 2013  1:13 PM       Dr. Shirlee Latch reviewed the cardiology data and recommended continuing low-dose beta blocker. If symptoms persist after the next 4-6 weeks; it could be discontinued. ------

## 2013-04-03 NOTE — Telephone Encounter (Signed)
LM @ (5:05pm) asking the pt to RTC regarding note below about meds.//AB/CMA

## 2013-04-08 NOTE — Telephone Encounter (Signed)
LM @ (1:20pm) asking the pt to RTC regarding meds.//AB/CMA

## 2013-04-08 NOTE — Telephone Encounter (Signed)
Spoke with the pt and informed him of Dr. Frederik Pear recommendation below.  Pt stated that he had already stopped the meds times 2 days.  Pt stated the dizziness was so bad when he got up, so he felt he needed to stop the meds.  Informed the pt that Dr. Alwyn Ren is out of the office and he want be back until Sept 5th.  Pt stated that he will call Dr. Alford Highland office and follow-up with Dr. Shirlee Latch and let him know he stopped the meds.//AB/CMA

## 2013-06-19 ENCOUNTER — Other Ambulatory Visit: Payer: Self-pay

## 2013-10-31 ENCOUNTER — Ambulatory Visit (INDEPENDENT_AMBULATORY_CARE_PROVIDER_SITE_OTHER): Payer: BC Managed Care – PPO | Admitting: Internal Medicine

## 2013-10-31 ENCOUNTER — Encounter: Payer: Self-pay | Admitting: Internal Medicine

## 2013-10-31 ENCOUNTER — Other Ambulatory Visit (INDEPENDENT_AMBULATORY_CARE_PROVIDER_SITE_OTHER): Payer: BC Managed Care – PPO

## 2013-10-31 VITALS — BP 120/80 | HR 65 | Temp 97.7°F | Resp 12 | Wt 210.8 lb

## 2013-10-31 DIAGNOSIS — Z87898 Personal history of other specified conditions: Secondary | ICD-10-CM

## 2013-10-31 DIAGNOSIS — R5383 Other fatigue: Principal | ICD-10-CM

## 2013-10-31 DIAGNOSIS — Z9189 Other specified personal risk factors, not elsewhere classified: Secondary | ICD-10-CM

## 2013-10-31 DIAGNOSIS — K5900 Constipation, unspecified: Secondary | ICD-10-CM

## 2013-10-31 DIAGNOSIS — E039 Hypothyroidism, unspecified: Secondary | ICD-10-CM

## 2013-10-31 DIAGNOSIS — Z23 Encounter for immunization: Secondary | ICD-10-CM

## 2013-10-31 DIAGNOSIS — R5381 Other malaise: Secondary | ICD-10-CM

## 2013-10-31 LAB — TSH: TSH: 5.94 u[IU]/mL — ABNORMAL HIGH (ref 0.35–5.50)

## 2013-10-31 NOTE — Patient Instructions (Signed)
Your next office appointment will be determined based upon review of your pending labs. Those instructions will be transmitted to you through My Chart . 

## 2013-10-31 NOTE — Progress Notes (Signed)
Pre visit review using our clinic review tool, if applicable. No additional management support is needed unless otherwise documented below in the visit note. 

## 2013-10-31 NOTE — Progress Notes (Signed)
   Subjective:    Patient ID: [place], male    DOB: 08/13/1946, 68 y.o.   MRN: [id number]  HPI Patient is here to monitor thyroid status; he is concerned as he has fatigue and excess somnolence despite resting well. He's also had some constipation. He's had an 8# weight loss with lifestyle changes, specifically increased exercise and improved diet .  He specifically denies abdominal pain, unexplained weight loss, melena, or rectal bleeding.  His last TSH was high normal at 5.16 on 03/28/13.There has been no change in the dose ,brand ,mode of administration of thyroid supplement  His wife describes significant snoring; he now sleeps in another room. There's been no definite report of sleep apnea. His Fit Bit notes no abnormalities.           Review of Systems Constitutional: No significant  change in appetite. Eye: no blurred, double ,loss of vision Cardiovascular: no palpitations; racing; irregularity ENT/GI: no  diarrhea;hoarseness;dysphagia Derm: no change in nails,hair,skin Neuro: no numbness or tingling; tremor Psych:no anxiety; depression Endo: no temperature intolerance to heat ,cold     Objective:   Physical Exam Gen.:  well-nourished; in no acute distress Eyes: Extraocular motion intact; no lid lag or proptosis ,nystagmus. Slight ptosis Neck: full ROM; no masses ; thyroid normal  Heart: Normal rhythm and rate without significant murmur, gallop, or extra heart sounds Lungs: Chest clear to auscultation without rales,rales, wheezes Neuro:Deep tendon reflexes are equal and within normal limits; no tremor  Skin: Warm and dry without significant lesions or rashes; no onycholysis Lymphatic: no cervical or axillary LA Psych: Normally communicative and interactive; no abnormal mood or affect clinically.        Assessment & Plan:  #1 fatigue  #2 constipation  #3 excess somnolence  #4 excess snoring  Plan: See orders

## 2013-11-01 ENCOUNTER — Other Ambulatory Visit: Payer: Self-pay | Admitting: Internal Medicine

## 2013-11-01 DIAGNOSIS — E039 Hypothyroidism, unspecified: Secondary | ICD-10-CM

## 2013-11-01 MED ORDER — LEVOTHYROXINE SODIUM 25 MCG PO TABS: ORAL_TABLET | ORAL | Status: DC

## 2013-11-17 ENCOUNTER — Other Ambulatory Visit: Payer: Self-pay | Admitting: Internal Medicine

## 2013-11-17 MED ORDER — LEVOTHYROXINE SODIUM 50 MCG PO TABS: ORAL_TABLET | ORAL | Status: DC

## 2013-11-24 ENCOUNTER — Other Ambulatory Visit: Payer: Self-pay

## 2013-11-24 MED ORDER — PRAVASTATIN SODIUM 20 MG PO TABS: 20.0000 mg | ORAL_TABLET | Freq: Every evening | ORAL | Status: DC

## 2014-01-16 ENCOUNTER — Ambulatory Visit: Payer: BC Managed Care – PPO | Admitting: Cardiology

## 2014-02-16 ENCOUNTER — Other Ambulatory Visit: Payer: Self-pay | Admitting: Internal Medicine

## 2014-03-04 ENCOUNTER — Other Ambulatory Visit: Payer: Self-pay | Admitting: Cardiology

## 2014-03-23 ENCOUNTER — Other Ambulatory Visit: Payer: Self-pay | Admitting: Internal Medicine

## 2014-03-23 ENCOUNTER — Telehealth: Payer: Self-pay | Admitting: Internal Medicine

## 2014-03-23 DIAGNOSIS — Z0000 Encounter for general adult medical examination without abnormal findings: Secondary | ICD-10-CM

## 2014-03-23 NOTE — Telephone Encounter (Signed)
Orders entered ; he needs to tell Lab he is to have CPX panel , not just TSH

## 2014-03-23 NOTE — Telephone Encounter (Signed)
Patient has been advised

## 2014-03-23 NOTE — Telephone Encounter (Signed)
Patient is calling to see if his labs can be put in so that he can have them done before his physical on 04/08/14. He says his insurance will pay.

## 2014-03-26 ENCOUNTER — Other Ambulatory Visit (INDEPENDENT_AMBULATORY_CARE_PROVIDER_SITE_OTHER): Payer: BC Managed Care – PPO

## 2014-03-26 DIAGNOSIS — Z0000 Encounter for general adult medical examination without abnormal findings: Secondary | ICD-10-CM

## 2014-03-26 LAB — BASIC METABOLIC PANEL
BUN: 20 mg/dL (ref 6–23)
CHLORIDE: 107 meq/L (ref 96–112)
CO2: 28 mEq/L (ref 19–32)
Calcium: 9.2 mg/dL (ref 8.4–10.5)
Creatinine, Ser: 1.2 mg/dL (ref 0.4–1.5)
GFR: 63.67 mL/min (ref >60.00)
GLUCOSE: 94 mg/dL (ref 70–99)
POTASSIUM: 4.2 meq/L (ref 3.5–5.1)
Sodium: 142 mEq/L (ref 135–145)

## 2014-03-26 LAB — CBC WITH DIFFERENTIAL/PLATELET
BASOS ABS: 0 10*3/uL (ref 0.0–0.1)
Basophils Relative: 0.2 % (ref 0.0–3.0)
Eosinophils Absolute: 0.3 10*3/uL (ref 0.0–0.7)
Eosinophils Relative: 4.5 % (ref 0.0–5.0)
HEMATOCRIT: 44 % (ref 39.0–52.0)
Hemoglobin: 14.8 g/dL (ref 13.0–17.0)
LYMPHS ABS: 1.8 10*3/uL (ref 0.7–4.0)
Lymphocytes Relative: 29.4 % (ref 12.0–46.0)
MCHC: 33.7 g/dL (ref 30.0–36.0)
MCV: 92.2 fl (ref 78.0–100.0)
Monocytes Absolute: 0.8 10*3/uL (ref 0.1–1.0)
Monocytes Relative: 12.3 % — ABNORMAL HIGH (ref 3.0–12.0)
NEUTROS PCT: 53.6 % (ref 43.0–77.0)
Neutro Abs: 3.4 10*3/uL (ref 1.4–7.7)
Platelets: 185 10*3/uL (ref 150.0–400.0)
RBC: 4.77 Mil/uL (ref 4.22–5.81)
RDW: 13.6 % (ref 11.5–15.5)
WBC: 6.3 10*3/uL (ref 4.0–10.5)

## 2014-03-26 LAB — LIPID PANEL
CHOLESTEROL: 193 mg/dL (ref 0–200)
HDL: 44.8 mg/dL (ref >39.00)
LDL Cholesterol: 116 mg/dL — ABNORMAL HIGH (ref 0–99)
NonHDL: 148.2
Total CHOL/HDL Ratio: 4
Triglycerides: 159 mg/dL — ABNORMAL HIGH (ref 0.0–149.0)
VLDL: 31.8 mg/dL (ref 0.0–40.0)

## 2014-03-26 LAB — HEPATIC FUNCTION PANEL
ALT: 25 U/L (ref 0–53)
AST: 27 U/L (ref 0–37)
Albumin: 3.9 g/dL (ref 3.5–5.2)
Alkaline Phosphatase: 54 U/L (ref 39–117)
Bilirubin, Direct: 0.1 mg/dL (ref 0.0–0.3)
Total Bilirubin: 0.7 mg/dL (ref 0.2–1.2)
Total Protein: 7.1 g/dL (ref 6.0–8.3)

## 2014-03-26 LAB — TSH: TSH: 6.81 u[IU]/mL — ABNORMAL HIGH (ref 0.35–4.50)

## 2014-04-03 ENCOUNTER — Encounter: Payer: BC Managed Care – PPO | Admitting: Internal Medicine

## 2014-04-08 ENCOUNTER — Ambulatory Visit (INDEPENDENT_AMBULATORY_CARE_PROVIDER_SITE_OTHER): Payer: BC Managed Care – PPO | Admitting: Internal Medicine

## 2014-04-08 ENCOUNTER — Encounter: Payer: Self-pay | Admitting: Internal Medicine

## 2014-04-08 VITALS — BP 96/66 | HR 69 | Temp 97.8°F | Ht 71.0 in | Wt 212.2 lb

## 2014-04-08 DIAGNOSIS — E785 Hyperlipidemia, unspecified: Secondary | ICD-10-CM

## 2014-04-08 DIAGNOSIS — Z0000 Encounter for general adult medical examination without abnormal findings: Secondary | ICD-10-CM

## 2014-04-08 DIAGNOSIS — E039 Hypothyroidism, unspecified: Secondary | ICD-10-CM

## 2014-04-08 MED ORDER — PRAVASTATIN SODIUM 20 MG PO TABS: ORAL_TABLET | ORAL | Status: DC

## 2014-04-08 MED ORDER — LEVOTHYROXINE SODIUM 75 MCG PO TABS: 75.0000 ug | ORAL_TABLET | Freq: Every day | ORAL | Status: DC

## 2014-04-08 NOTE — Progress Notes (Signed)
   Subjective:    Patient ID: TIQUAN BOUCH, male    DOB: 1945-10-18, 68 y.o.   MRN: [id number]  HPI  Nicholai is here for a physical;acute issues denied.  A heart healthy diet is followed; exercise encompasses 10,000 - 17,000 steps/ day without symptoms.   Family history is negative for premature coronary disease. Advanced cholesterol testing reveals  LDL goal is less than 100 ; ideally < 70 . There is medication compliance with the statin.  Low dose ASA taken.   Review of Systems  Significant abdominal symptoms, memory deficit, or myalgias not present. No significant change in weight; significant fatigue; sleep disorder; change in appetite. No blurred, double vision ,loss of vision No palpitations; racing; irregularity Some constipation controlled with fiber. No diarrhea;hoarseness;dysphagia No change in nails,hair,skin No numbness or tingling; tremor No anxiety; depression No temperature intolerance to heat ,cold       Objective:   Physical Exam Gen.: Healthy and well-nourished in appearance. Alert, appropriate and cooperative throughout exam. Appears younger than stated age  Head: Normocephalic without obvious abnormalities;  pattern alopecia  Eyes: Ptosis bilaterally.No corneal or conjunctival inflammation noted. Pupils equal round reactive to light and accommodation. Extraocular motion intact.  Ears: External  ear exam reveals no significant lesions or deformities. Canals clear .TMs normal. Hearing is grossly decreased bilaterally, L > R. Nose: External nasal exam reveals no deformity or inflammation. Nasal mucosa are pink and moist. No lesions or exudates noted.   Mouth: Oral mucosa and oropharynx reveal no lesions or exudates. Teeth in good repair. Neck: No deformities, masses, or tenderness noted. Range of motion & Thyroid normal. Lungs: Normal respiratory effort; chest expands symmetrically. Lungs are clear to auscultation without rales, wheezes, or increased work of  breathing. Heart: Normal rate and rhythm. Normal S1 and S2. No gallop, click, or rub. No murmur. Abdomen: Bowel sounds normal; abdomen soft and nontender. No masses, organomegaly or hernias noted. Genitalia: Genitalia normal except for left varices & snall granuloma. Prostate is normal without enlargement, asymmetry, nodularity, or induration                               Musculoskeletal/extremities: No deformity or scoliosis noted of  the thoracic or lumbar spine.  No clubbing, cyanosis, edema, or significant extremity  deformity noted. Range of motion normal .Tone & strength normal. Hand joints normal Fingernail health good. Able to lie down & sit up w/o help. Negative SLR bilaterally Vascular: Carotid, radial artery, dorsalis pedis and  posterior tibial pulses are full and equal. No bruits present. Neurologic: Alert and oriented x3. Deep tendon reflexes symmetrical and normal.  Gait normal .       Skin: Intact without suspicious lesions or rashes. Lymph: No cervical, axillary, or inguinal lymphadenopathy present. Psych: Mood and affect are normal. Normally interactive                                                                                        Assessment & Plan:  #1 comprehensive physical exam; no acute findings  Plan: see Orders  & Recommendations

## 2014-04-08 NOTE — Patient Instructions (Signed)
Your TSH goal is approximately 1-3 .Please increase thyroid to 75 mcg daily .Repeat TSH in 10-12 weeks. Do not take thyroid within 2 hours of other meds.

## 2014-04-08 NOTE — Progress Notes (Signed)
Pre visit review using our clinic review tool, if applicable. No additional management support is needed unless otherwise documented below in the visit note. 

## 2014-04-10 ENCOUNTER — Encounter: Payer: Self-pay | Admitting: Cardiology

## 2014-04-10 ENCOUNTER — Ambulatory Visit (INDEPENDENT_AMBULATORY_CARE_PROVIDER_SITE_OTHER): Payer: BC Managed Care – PPO | Admitting: Cardiology

## 2014-04-10 VITALS — BP 124/82 | HR 75 | Ht 71.0 in | Wt 213.0 lb

## 2014-04-10 DIAGNOSIS — I422 Other hypertrophic cardiomyopathy: Secondary | ICD-10-CM

## 2014-04-10 DIAGNOSIS — R072 Precordial pain: Secondary | ICD-10-CM

## 2014-04-10 NOTE — Patient Instructions (Signed)
Your physician has requested that you have an exercise tolerance test. For further information please visit [email or web address] Please also follow instruction sheet, as given.  Your physician wants you to follow-up in: 1 year with Dr Shirlee Latch. (August 2016) . You will receive a reminder letter in the mail two months in advance. If you don't receive a letter, please call our office to schedule the follow-up appointment.

## 2014-04-12 NOTE — Progress Notes (Signed)
Patient ID: [place], male   DOB: 12/08/1945, 68 y.o.   MRN: [id number] PCP: Dr. Alwyn Ren  68 yo with history of hyperlipidemia and GERD presented initially for evaluation of an episode of quite atypical chest pain that seemed GI-related in 2014.  He has had no exertional chest pain.  He walks the golf course 2 times a week with no problems.  He uses a Fitbit and gets > 10000 steps in daily.  No exertional dyspnea.  He had a prior chest pain workup in 2011.  At that time, he had an ETT-myoview that showed inferior ischemia.  LHC was then done, showing no obstructive CAD.  The Myoview was thought to be falsely positive.   Since that appointment, he has had no recurrent chest pain.  He is quite active.  I did not set him up for a stress test given false positive test in the past.  I wanted to see if he had any more episodes of pain.  I did have him do an echocardiogram in 4/14.  This was abnormal, showing basal septal hypertrophy with mild LV outflow tract gradient and chordal versus valvular SAM, trivial MR, and moderate diastolic dysfunction.  He had cardiac MRI in 6/14 showing mild focal basal septal hypertrophy with systolic anterior motion of the mitral valve and mild MR.  There was no delayed enhancement.  I had him try taking a low dose of beta blocker but he was not able to tolerate this due to fatigue and orthostasis.  Patient denies tachypalpitations or syncope/presyncope.    ECG: NSR, 1st degree AV block, RBBB, LPFB (no changes from prior)  Labs (4/14): HDL 36, LDL 121, D dimer negative, TSH normal Labs (8/15): LDL 116, HDL 45, K 4.2, creatinine 1.2  PMH: 1. GERD 2. Hyperlipidemia: Myalgias with > 20 mg daily of pravastatin.  3. Hypothyroidism 4. Chronic RBBB 5. Chest pain: ETT-myoview in 2/11 with inferior ischemia.  Echo with EF > 55%, no valvular abnormalities.  Patient had LHC in 2/11 with no significant coronary disease (false positive stress test). 6. Suspected variant of  hypertrophic cardiomyopathy: Echo (4/14) with EF 55-60%, focal basal septal hypertrophy with mild LV outflow tract obstruction/gradient, grade II diastolic dysfunction, valvular versus chordal SAM (hard to tell), trivial MR.  Cardiac MRI (6/14) with EF 65%, mild focal basal septal hypertrophy, systolic anterior motion of the mitral valve with mild MR, normal RV size and systolic function, no myocardial delayed enhancement.    SH: Nonsmoker, lives in [place], married, Clinical research associate  FH: Father died at 47 with CHF.   ROS: All systems reviewed and negative except as per HPI.   Current Outpatient Prescriptions  Medication Sig Dispense Refill  . aspirin EC 81 MG tablet Take 1 tablet (81 mg total) by mouth daily.      Marland Kitchen levothyroxine (SYNTHROID, LEVOTHROID) 75 MCG tablet Take 1 tablet (75 mcg total) by mouth daily.  90 tablet  3  . Loratadine (CLARITIN) 10 MG CAPS Take by mouth.      . Multiple Vitamin (MULTIVITAMIN) tablet Take 1 tablet by mouth daily.        . pravastatin (PRAVACHOL) 20 MG tablet TAKE ONE TABLET BY MOUTH EVERY EVENING.  90 tablet  3  . ranitidine (ZANTAC) 150 MG tablet TAKE 1 TABLET (150 MG TOTAL) BY MOUTH DAILY.       No current facility-administered medications for this visit.    BP 124/82  Pulse 75  Ht  (1.803 m)  Wt 96.616 kg (213 lb)  BMI 29.72 kg/m2 General: NAD Neck: No JVD, no thyromegaly or thyroid nodule.  Lungs: Clear to auscultation bilaterally with normal respiratory effort. CV: Nondisplaced PMI.  Heart regular S1/S2, no S3/S4, 2/6 early SEM.  No peripheral edema.  No carotid bruit.  Normal pedal pulses.  Abdomen: Soft, nontender, no hepatosplenomegaly, no distention.  Neurologic: Alert and oriented x 3.  Psych: Normal affect. Extremities: No clubbing or cyanosis.   Assessment/Plan: 1. Chest pain: No recent chest pain.  2. Hyperlipidemia: He was unable to tolerate pravastatin 40 so will continue on pravastatin 20.  3. Suspected variant of hypertrophic  cardiomyopathy: From echo, I was concerned for a HCM variant.  Most likely this is a relatively benign variant as he has been totally asymptomatic.  He has a mild LVOT gradient with mild ejection murmur on exam.  Cardiac MRI showed mild focal basal septal hypertrophy with mitral valve SAM and mild MR.  There was no myocardial delayed enhancement. Even with very low dose of beta blocker, patient had lightheadedness and fatigue and was unable to to tolerate it. Given lack of symptoms, would not push this.  I did suggest today that he get an ETT to assess for exercise tolerance, BP response to exercise, and ventricular arrhythmias.  Finally, his children (in their 30s) should have baseline echoes.    Marca Ancona 04/12/2014

## 2014-04-13 ENCOUNTER — Other Ambulatory Visit: Payer: Self-pay | Admitting: Internal Medicine

## 2014-04-13 DIAGNOSIS — E039 Hypothyroidism, unspecified: Secondary | ICD-10-CM

## 2014-04-14 MED ORDER — LEVOTHYROXINE SODIUM 75 MCG PO TABS: 75.0000 ug | ORAL_TABLET | Freq: Every day | ORAL | Status: DC

## 2014-04-14 NOTE — Telephone Encounter (Signed)
Dose was changed to 75 mcg qd on 04/08/14 #90

## 2014-04-14 NOTE — Telephone Encounter (Signed)
Patient states he is being increased to 75 mcg. Is this correct? What are the directions?

## 2014-05-20 ENCOUNTER — Encounter: Payer: BC Managed Care – PPO | Admitting: Physician Assistant

## 2014-06-19 ENCOUNTER — Other Ambulatory Visit: Payer: Self-pay | Admitting: Cardiology

## 2014-07-22 ENCOUNTER — Other Ambulatory Visit: Payer: Self-pay | Admitting: Internal Medicine

## 2014-08-28 ENCOUNTER — Other Ambulatory Visit (INDEPENDENT_AMBULATORY_CARE_PROVIDER_SITE_OTHER): Payer: Self-pay

## 2014-08-28 ENCOUNTER — Other Ambulatory Visit: Payer: Self-pay | Admitting: Internal Medicine

## 2014-08-28 DIAGNOSIS — E038 Other specified hypothyroidism: Secondary | ICD-10-CM

## 2014-08-28 DIAGNOSIS — E039 Hypothyroidism, unspecified: Secondary | ICD-10-CM

## 2014-08-28 LAB — TSH: TSH: 4.63 u[IU]/mL — ABNORMAL HIGH (ref 0.35–4.50)

## 2014-10-12 ENCOUNTER — Telehealth: Payer: Self-pay

## 2014-10-12 NOTE — Telephone Encounter (Signed)
Phone call from patient requesting TSH lab orders be placed to have done tomorrow. TSH lab order has already been placed by provider.

## 2014-10-13 ENCOUNTER — Other Ambulatory Visit (INDEPENDENT_AMBULATORY_CARE_PROVIDER_SITE_OTHER): Payer: Self-pay

## 2014-10-13 DIAGNOSIS — E038 Other specified hypothyroidism: Secondary | ICD-10-CM

## 2014-10-13 LAB — TSH: TSH: 2.58 u[IU]/mL (ref 0.35–4.50)

## 2014-10-16 ENCOUNTER — Other Ambulatory Visit: Payer: Self-pay | Admitting: *Deleted

## 2014-10-16 MED ORDER — PRAVASTATIN SODIUM 20 MG PO TABS: ORAL_TABLET | ORAL | Status: DC

## 2014-10-29 ENCOUNTER — Telehealth: Payer: Self-pay | Admitting: Internal Medicine

## 2014-10-30 ENCOUNTER — Telehealth: Payer: Self-pay

## 2014-10-30 NOTE — Telephone Encounter (Signed)
Left message for pt to call back if he still wants flu vaccine 

## 2015-01-13 ENCOUNTER — Other Ambulatory Visit: Payer: Self-pay | Admitting: Internal Medicine

## 2015-02-06 ENCOUNTER — Other Ambulatory Visit: Payer: Self-pay | Admitting: Internal Medicine

## 2015-02-06 ENCOUNTER — Other Ambulatory Visit: Payer: Self-pay | Admitting: Cardiology

## 2015-02-09 ENCOUNTER — Other Ambulatory Visit: Payer: Self-pay | Admitting: Internal Medicine

## 2015-02-09 NOTE — Telephone Encounter (Signed)
Synthroid rx sent to pharm 

## 2015-04-27 ENCOUNTER — Other Ambulatory Visit: Payer: Self-pay | Admitting: Internal Medicine

## 2015-04-27 NOTE — Telephone Encounter (Signed)
LVM for pt to call back and schedule an OV before sending refills in.

## 2015-04-28 ENCOUNTER — Ambulatory Visit (INDEPENDENT_AMBULATORY_CARE_PROVIDER_SITE_OTHER): Payer: BLUE CROSS/BLUE SHIELD | Admitting: Internal Medicine

## 2015-04-28 ENCOUNTER — Encounter: Payer: Self-pay | Admitting: Internal Medicine

## 2015-04-28 VITALS — BP 120/82 | HR 62 | Temp 97.7°F | Resp 16 | Ht 71.0 in | Wt 211.0 lb

## 2015-04-28 DIAGNOSIS — Z23 Encounter for immunization: Secondary | ICD-10-CM

## 2015-04-28 DIAGNOSIS — Z0000 Encounter for general adult medical examination without abnormal findings: Secondary | ICD-10-CM

## 2015-04-28 NOTE — Patient Instructions (Signed)
  Your next office appointment will be determined based upon review of your pending labs. Those written interpretation of the lab results and instructions will be transmitted to you by My Chart  Critical results will be called.   Followup as needed for any active or acute issue. Please report any significant change in your symptoms. 

## 2015-04-28 NOTE — Progress Notes (Signed)
Pre visit review using our clinic review tool, if applicable. No additional management support is needed unless otherwise documented below in the visit note. 

## 2015-04-28 NOTE — Telephone Encounter (Signed)
Pt has CPX appt today @ 1:30...Raechel Chute

## 2015-04-28 NOTE — Progress Notes (Signed)
Subjective:    Patient ID: [place], male    DOB: November 23, 1945, 69 y.o.   MRN: [id number]  HPI The patient is here for a physical to assess status of active health conditions.  PMH, FH, & Social History reviewed & updated.No change in FH as recorded.  He stopped his pravastatin 6 weeks ago as he was having pain in the left upper extremity mainly at the shoulder area. He had similar symptoms in the right upper extremity to a lesser extent.   8-9 years ago he had similar symptoms only in the right upper extremity; this resolved when he stopped his statin.  There is no family history of heart attack or stroke. Advanced lipid testing in 2004 revealed an LDL of 163 with total particle #2082 and small dense particles of 1146. Based on that testing LDL goal should be less than 100, ideally less than 70.  He eats some fried foods but restricts red meat and salt.  He walks 1-2 times per week playing golf and carries his clubs. He also exercises 60 minutes 3 times week without cardio pulmonary symptoms.  He previously had constipation but this resolved with the thyroid supplementation. He's lost 7-8 pounds with nutrition and exercise. Because of hair loss Propecia was prescribed by his Dermatologist 6 weeks ago.  There is no family history of prostate disease. His last PSA was 0.29 and 2012.  Review of Systems  Chest pain, palpitations, tachycardia, exertional dyspnea, paroxysmal nocturnal dyspnea, claudication or edema are absent. No unexplained weight loss, abdominal pain, significant dyspepsia, dysphagia, melena, rectal bleeding, or persistently small caliber stools. Dysuria, pyuria, hematuria, frequency, nocturia or polyuria are denied. Change in skin or nails denied. No bowel changes of constipation or diarrhea @ present. No intolerance to heat or cold.     Objective:   Physical Exam  Pertinent or positive findings include: Pattern alopecia is present. He has ptosis greater on  the left than the right. He has decreased hearing on the left. There is a holosystolic grade 1 systolic murmur at the lower left sternal border. Varices are noted in the left scrotum. Prostate is small without induration or nodularity. He has crepitus of the knees.  General appearance :adequately nourished; in no distress.  Eyes: No conjunctival inflammation or scleral icterus is present.  Oral exam:  Lips and gums are healthy appearing.There is no oropharyngeal erythema or exudate noted. Dental hygiene is good.  Heart:  Normal rate and regular rhythm. S1 and S2 normal without gallop, click, rub or other extra sounds    Lungs:Chest clear to auscultation; no wheezes, rhonchi,rales ,or rubs present.No increased work of breathing.   Abdomen: bowel sounds normal, soft and non-tender without masses, organomegaly or hernias noted.  No guarding or rebound.   Vascular : all pulses equal ; no bruits present.  Skin:Warm & dry.  Intact without suspicious lesions or rashes ; no tenting or jaundice   Lymphatic: No lymphadenopathy is noted about the head, neck, axilla, or inguinal areas.   Neuro: Strength, tone & DTRs normal.      Assessment & Plan:  #1 comprehensive physical exam; no acute findings.  #2 he's had significant elevation of the LDL with significant risk suggested by total number of particles and number of small dense particles. He's had 2 episodes of possible statin intolerance. Before statin would be restarted; the advanced testing should be rechecked to optimally assess whether there is indeed any personal risk .  Plan: see Orders  &  Recommendations

## 2015-04-29 ENCOUNTER — Other Ambulatory Visit: Payer: Self-pay | Admitting: Internal Medicine

## 2015-04-29 ENCOUNTER — Other Ambulatory Visit (INDEPENDENT_AMBULATORY_CARE_PROVIDER_SITE_OTHER): Payer: BLUE CROSS/BLUE SHIELD

## 2015-04-29 DIAGNOSIS — Z0189 Encounter for other specified special examinations: Secondary | ICD-10-CM | POA: Diagnosis not present

## 2015-04-29 LAB — CBC WITH DIFFERENTIAL/PLATELET
BASOS PCT: 0.2 % (ref 0.0–3.0)
Basophils Absolute: 0 10*3/uL (ref 0.0–0.1)
EOS ABS: 0.2 10*3/uL (ref 0.0–0.7)
Eosinophils Relative: 2.5 % (ref 0.0–5.0)
HCT: 43.4 % (ref 39.0–52.0)
Hemoglobin: 14.6 g/dL (ref 13.0–17.0)
Lymphocytes Relative: 23.2 % (ref 12.0–46.0)
Lymphs Abs: 1.5 10*3/uL (ref 0.7–4.0)
MCHC: 33.7 g/dL (ref 30.0–36.0)
MCV: 91.9 fl (ref 78.0–100.0)
MONO ABS: 0.7 10*3/uL (ref 0.1–1.0)
Monocytes Relative: 11.4 % (ref 3.0–12.0)
NEUTROS ABS: 4.1 10*3/uL (ref 1.4–7.7)
NEUTROS PCT: 62.7 % (ref 43.0–77.0)
PLATELETS: 188 10*3/uL (ref 150.0–400.0)
RBC: 4.72 Mil/uL (ref 4.22–5.81)
RDW: 13.9 % (ref 11.5–15.5)
WBC: 6.5 10*3/uL (ref 4.0–10.5)

## 2015-04-29 LAB — BASIC METABOLIC PANEL
BUN: 22 mg/dL (ref 6–23)
CHLORIDE: 108 meq/L (ref 96–112)
CO2: 26 mEq/L (ref 19–32)
Calcium: 9.5 mg/dL (ref 8.4–10.5)
Creatinine, Ser: 1.28 mg/dL (ref 0.40–1.50)
GFR: 58.91 mL/min — ABNORMAL LOW (ref >60.00)
GLUCOSE: 91 mg/dL (ref 70–99)
POTASSIUM: 4.4 meq/L (ref 3.5–5.1)
Sodium: 143 mEq/L (ref 135–145)

## 2015-04-29 LAB — HEPATIC FUNCTION PANEL
ALT: 20 U/L (ref 0–53)
AST: 21 U/L (ref 0–37)
Albumin: 3.8 g/dL (ref 3.5–5.2)
Alkaline Phosphatase: 60 U/L (ref 39–117)
BILIRUBIN DIRECT: 0.1 mg/dL (ref 0.0–0.3)
BILIRUBIN TOTAL: 0.4 mg/dL (ref 0.2–1.2)
TOTAL PROTEIN: 7 g/dL (ref 6.0–8.3)

## 2015-04-29 LAB — TSH: TSH: 5.47 u[IU]/mL — ABNORMAL HIGH (ref 0.35–4.50)

## 2015-05-01 LAB — NMR LIPOPROFILE WITH LIPIDS
Cholesterol, Total: 202 mg/dL — ABNORMAL HIGH (ref 100–199)
HDL PARTICLE NUMBER: 28.4 umol/L — AB (ref >=30.5)
HDL Size: 8.9 nm — ABNORMAL LOW (ref >=9.2)
HDL-C: 47 mg/dL (ref >39)
LARGE HDL: 5 umol/L (ref >=4.8)
LARGE VLDL-P: 2.7 nmol/L (ref <=2.7)
LDL (calc): 122 mg/dL — ABNORMAL HIGH (ref 0–99)
LDL PARTICLE NUMBER: 1487 nmol/L — AB (ref <1000)
LDL Size: 20.2 nm (ref >=20.8)
LP-IR Score: 49 — ABNORMAL HIGH (ref <=45)
Small LDL Particle Number: 919 nmol/L — ABNORMAL HIGH (ref <=527)
TRIGLYCERIDES: 165 mg/dL — AB (ref 0–149)
VLDL SIZE: 41.6 nm (ref <=46.6)

## 2015-05-02 ENCOUNTER — Other Ambulatory Visit: Payer: Self-pay | Admitting: Internal Medicine

## 2015-05-02 DIAGNOSIS — E038 Other specified hypothyroidism: Secondary | ICD-10-CM

## 2015-05-12 ENCOUNTER — Encounter: Payer: Self-pay | Admitting: Internal Medicine

## 2015-05-13 ENCOUNTER — Encounter: Payer: Self-pay | Admitting: Cardiology

## 2015-05-26 ENCOUNTER — Encounter: Payer: Self-pay | Admitting: Cardiology

## 2015-07-12 ENCOUNTER — Telehealth: Payer: Self-pay | Admitting: Internal Medicine

## 2015-07-12 NOTE — Telephone Encounter (Signed)
LVM for pt to call back as soon as possible.   RE: my chart message with same: lipid was done this year already and may not be covered by insurance.

## 2015-07-12 NOTE — Telephone Encounter (Signed)
Patient is requesting to get a cholesterol test to go with his TSH. Please advise.

## 2015-07-13 ENCOUNTER — Other Ambulatory Visit: Payer: Self-pay | Admitting: Internal Medicine

## 2015-07-13 ENCOUNTER — Other Ambulatory Visit (INDEPENDENT_AMBULATORY_CARE_PROVIDER_SITE_OTHER): Payer: BLUE CROSS/BLUE SHIELD

## 2015-07-13 DIAGNOSIS — E038 Other specified hypothyroidism: Secondary | ICD-10-CM | POA: Diagnosis not present

## 2015-07-13 DIAGNOSIS — E039 Hypothyroidism, unspecified: Secondary | ICD-10-CM

## 2015-07-13 DIAGNOSIS — E785 Hyperlipidemia, unspecified: Secondary | ICD-10-CM

## 2015-07-13 LAB — TSH: TSH: 4.93 u[IU]/mL — AB (ref 0.35–4.50)

## 2015-07-13 NOTE — Telephone Encounter (Signed)
Sent pt an email that order for lipid draw has been entered.

## 2015-07-13 NOTE — Telephone Encounter (Signed)
Per pt email - would like to have another lipid test. Statin stopped in July.

## 2015-07-26 ENCOUNTER — Other Ambulatory Visit: Payer: Self-pay | Admitting: Internal Medicine

## 2015-07-27 ENCOUNTER — Other Ambulatory Visit: Payer: Self-pay | Admitting: Cardiology

## 2015-07-29 ENCOUNTER — Other Ambulatory Visit: Payer: Self-pay | Admitting: Cardiology

## 2015-08-27 ENCOUNTER — Ambulatory Visit (INDEPENDENT_AMBULATORY_CARE_PROVIDER_SITE_OTHER): Payer: BLUE CROSS/BLUE SHIELD | Admitting: Cardiology

## 2015-08-27 ENCOUNTER — Encounter: Payer: Self-pay | Admitting: Cardiology

## 2015-08-27 ENCOUNTER — Other Ambulatory Visit: Payer: Self-pay | Admitting: *Deleted

## 2015-08-27 VITALS — BP 108/80 | HR 72 | Ht 71.0 in | Wt 217.0 lb

## 2015-08-27 DIAGNOSIS — I422 Other hypertrophic cardiomyopathy: Secondary | ICD-10-CM

## 2015-08-27 MED ORDER — ROSUVASTATIN CALCIUM 5 MG PO TABS: ORAL_TABLET | ORAL | Status: DC

## 2015-08-27 NOTE — Patient Instructions (Addendum)
Medication Instructions:  Start crestor (rosuvastatin) 5mg  every other day  Labwork: Your physician recommends that you return for a FASTING lipid profile /liver profile in 2 months.   Testing/Procedures: None today.  Follow-Up: Your physician wants you to follow-up in: 6 months with Dr Shirlee LatchMcLean. (July 2017). You will receive a reminder letter in the mail two months in advance. If you don't receive a letter, please call our office to schedule the follow-up appointment.   Any Other Special Instructions Will Be Listed Below (If Applicable).   Dr Shirlee LatchMcLean recommends your children get echocardiograms to screen for hypertrophic cardiomyopathy.    If you need a refill on your cardiac medications before your next appointment, please call your pharmacy.

## 2015-08-28 NOTE — Progress Notes (Signed)
Patient ID: [place], male   DOB: [birth year], 70 y.o.   MRN: [id number] PCP: Dr. Alwyn RenHopper  70 yo with history of hyperlipidemia and GERD presented initially for evaluation of an episode of quite atypical chest pain that seemed GI-related in 2014.  He had a prior chest pain workup in 2011.  At that time, he had an ETT-myoview that showed inferior ischemia.  LHC was then done, showing no obstructive CAD.  The Myoview was thought to be falsely positive.  I did not set him up for a stress test given false positive test in the past. I did have him do an echocardiogram in 4/14.  This was abnormal, showing basal septal hypertrophy with mild LV outflow tract gradient and chordal versus valvular SAM, trivial MR, and moderate diastolic dysfunction.  He had cardiac MRI in 6/14 showing mild focal basal septal hypertrophy with systolic anterior motion of the mitral valve and mild MR.  There was no delayed enhancement.  I had him try taking a low dose of beta blocker but he was not able to tolerate this due to fatigue and orthostasis.    He has done quite well since last appointment.  He exercises 3 days/week on a treadmill and plays golf on the weekend.  No lightheadedness or palpitations, no chest pain, no exertional dyspnea.  He had myalgias with pravastatin and is now not taking a statin.    ECG: NSR, 1st degree AV block, RBBB (no significant change from prior)  Labs (4/14): HDL 36, LDL 121, D dimer negative, TSH normal Labs (8/15): LDL 116, HDL 45, K 4.2, creatinine 1.2 Labs (9/16): LDL -P 1487, LDL 122, creatinine 1.2  PMH: 1. GERD 2. Hyperlipidemia: Myalgias with pravastatin.  3. Hypothyroidism 4. Chronic RBBB 5. Chest pain: ETT-myoview in 2/11 with inferior ischemia.  Echo with EF > 55%, no valvular abnormalities.  Patient had LHC in 2/11 with no significant coronary disease (false positive stress test). 6. Suspected variant of hypertrophic cardiomyopathy: Echo (4/14) with EF 55-60%, focal basal  septal hypertrophy with mild LV outflow tract obstruction/gradient, grade II diastolic dysfunction, valvular versus chordal SAM (hard to tell), trivial MR.  Cardiac MRI (6/14) with EF 65%, mild focal basal septal hypertrophy, systolic anterior motion of the mitral valve with mild MR, normal RV size and systolic function, no myocardial delayed enhancement.    SH: Nonsmoker, lives in [place], married, [place]  FH: Father died at 4172 with CHF.   ROS: All systems reviewed and negative except as per HPI.   Current Outpatient Prescriptions  Medication Sig Dispense Refill  . aspirin EC 81 MG tablet Take 1 tablet (81 mg total) by mouth daily.    . finasteride (PROPECIA) 1 MG tablet Take 1 mg by mouth daily.  11  . levothyroxine (SYNTHROID, LEVOTHROID) 75 MCG tablet Take 1 tablet (75 mcg total) by mouth daily. Except 1.5 tablets on Tuesday, Thursday and Saturday 108 tablet 1  . Loratadine (CLARITIN) 10 MG CAPS Take 1 capsule by mouth daily.     . Multiple Vitamin (MULTIVITAMIN) tablet Take 1 tablet by mouth daily.      . rosuvastatin (CRESTOR) 5 MG tablet 1 tablet (5mg ) by mouth every other day 45 tablet 0   No current facility-administered medications for this visit.    BP 108/80 mmHg  Pulse 72  Ht 5\' 11"  (1.803 m)  Wt 217 lb (98.431 kg)  BMI 30.28 kg/m2 General: NAD Neck: No JVD, no thyromegaly or thyroid nodule.  Lungs: Clear  to auscultation bilaterally with normal respiratory effort. CV: Nondisplaced PMI.  Heart regular S1/S2, no S3/S4, 2/6 early SEM.  No peripheral edema.  No carotid bruit.  Normal pedal pulses.  Abdomen: Soft, nontender, no hepatosplenomegaly, no distention.  Neurologic: Alert and oriented x 3.  Psych: Normal affect. Extremities: No clubbing or cyanosis.   Assessment/Plan: 1. Chest pain: No recent chest pain.  2. Hyperlipidemia: Unable to tolerate pravastatin.  LDL particle number is high. I will start him on Crestor 5 mg every other day with lipids/LFTs in 2  months.   3. Suspected variant of hypertrophic cardiomyopathy: From echo, I was concerned for a HCM variant.  Most likely this is a relatively benign variant as he has been totally asymptomatic.  He has a mild LVOT gradient with mild ejection murmur on exam.  Cardiac MRI showed mild focal basal septal hypertrophy with mitral valve SAM and mild MR.  There was no myocardial delayed enhancement. Even with very low dose of beta blocker, patient had lightheadedness and fatigue and was unable to to tolerate it. Given lack of symptoms, would not push this.  His children should have baseline echoes.    [place] 08/28/2015

## 2015-09-21 ENCOUNTER — Other Ambulatory Visit (INDEPENDENT_AMBULATORY_CARE_PROVIDER_SITE_OTHER): Payer: BLUE CROSS/BLUE SHIELD

## 2015-09-21 DIAGNOSIS — E785 Hyperlipidemia, unspecified: Secondary | ICD-10-CM | POA: Diagnosis not present

## 2015-09-21 DIAGNOSIS — E039 Hypothyroidism, unspecified: Secondary | ICD-10-CM

## 2015-09-21 LAB — TSH: TSH: 5.85 u[IU]/mL — AB (ref 0.35–4.50)

## 2015-09-21 LAB — LIPID PANEL
CHOLESTEROL: 182 mg/dL (ref 0–200)
HDL: 50.9 mg/dL (ref >39.00)
LDL CALC: 107 mg/dL — AB (ref 0–99)
NonHDL: 131.05
TRIGLYCERIDES: 118 mg/dL (ref 0.0–149.0)
Total CHOL/HDL Ratio: 4
VLDL: 23.6 mg/dL (ref 0.0–40.0)

## 2015-09-22 ENCOUNTER — Other Ambulatory Visit: Payer: Self-pay | Admitting: Internal Medicine

## 2015-09-22 DIAGNOSIS — E039 Hypothyroidism, unspecified: Secondary | ICD-10-CM

## 2015-09-27 ENCOUNTER — Ambulatory Visit (INDEPENDENT_AMBULATORY_CARE_PROVIDER_SITE_OTHER): Payer: BLUE CROSS/BLUE SHIELD | Admitting: Family Medicine

## 2015-09-27 ENCOUNTER — Encounter: Payer: Self-pay | Admitting: Family Medicine

## 2015-09-27 VITALS — BP 122/90 | HR 78 | Temp 97.9°F | Ht 71.0 in | Wt 214.0 lb

## 2015-09-27 DIAGNOSIS — R5383 Other fatigue: Secondary | ICD-10-CM | POA: Diagnosis not present

## 2015-09-27 DIAGNOSIS — E039 Hypothyroidism, unspecified: Secondary | ICD-10-CM

## 2015-09-27 DIAGNOSIS — Z7689 Persons encountering health services in other specified circumstances: Secondary | ICD-10-CM

## 2015-09-27 DIAGNOSIS — Z7189 Other specified counseling: Secondary | ICD-10-CM | POA: Diagnosis not present

## 2015-09-27 NOTE — Patient Instructions (Signed)
Please return to lab in 6 weeks for lab work to check your TSH. Continue other medications as prescribed and follow up with cardiology for lipid check as scheduled. Great work with your diet and exercise! Keep up this great pattern! If you have any concerns, new symptoms, or questions, please follow up for further evaluation. It was a pleasure meeting you today

## 2015-09-27 NOTE — Progress Notes (Signed)
Subjective:    Patient ID: Chase Hill, male    DOB: [birth year], 70 y.o.   MRN: [id number]  HPI  Chase Hill is  70 year old male who presents today to establish care.  Last wellness exam was 04/28/15.  PMH, FH, and Social history reviewed, No changes noted per patient. His main concern today is addressing his levothyroxine dose and future blood work to assess recent change in levothyroxine dose.  On 09/21/15 his TSH was 5.85.  Patient was advised by his PCP to  continue 75 mcg of levothyroxine daily with the exception of T, Th, and Sat  of taking 2 tablets to achieve 150 mcg. Today, patient stated that he has tried taking 1 1/2 tablets 6 days per week and 1 tablet once/week to achieve the weekly dose because it is easier for him to remember and this improves his medication adherence.  Since this weekly dose of 750 mcg per week is equivalent to the the 3 day/week increase, it will be continued and TSH and Free T4 will be obtained in 6-8 weeks. While this new medication dose started 1 week ago, patient states that he continues to feel some fatigue with episodes of constipation that can occur if fiber supplementation is not added to his diet every day.  Patient states that he remains able to exercise on his treadmill 3x/week, walking 2 miles. Also, he completes some weight lifting daily. On the weekends, he plays golf while walking and carrying his bag for exercise. All exercise noted above does not cause any report of cardio pulmonary symptoms.   He does eat some fried foods on rare occasions with sweets but restricts his red meat and salt intake.  Propecia was prescribed by his dermatologist and he is using this without any adverse effects noted. Patient mentioned that he may stop this medication because he mentions that he does not see significant results.  He recently started Crestor 5mg  daily which was advised by his cardiologist. He has no c/o of muscle aches/pains at this time. He  has a follow up appointment for lipid levels with cardiology who will be managing his statin therapy.   Review of Systems  Constitutional: Negative for fever, chills and fatigue.  Respiratory: Negative for cough, chest tightness and shortness of breath.   Cardiovascular: Negative for chest pain, palpitations and leg swelling.       No intermittent claudication  Gastrointestinal: Negative for nausea, vomiting, abdominal pain, diarrhea, constipation, blood in stool and abdominal distention.       No changes in bowel/constipation noted unless fiber therapy is not taken  Endocrine: Negative for cold intolerance, heat intolerance, polydipsia, polyphagia and polyuria.  Genitourinary: Positive for urgency. Negative for dysuria and frequency.  Musculoskeletal: Negative for myalgias and arthralgias.  Skin: Negative for pallor and rash.       No changes in skin or nails reported  Neurological: Negative for dizziness, light-headedness and headaches.     Past Medical History  Diagnosis Date  . Diverticulosis     colonoscopy 10-15-2006  . Gilbert syndrome   . Cervical radiculopathy at C7     PMH of  . Perennial allergic rhinitis with seasonal variation   . Hyperlipidemia   . GERD (gastroesophageal reflux disease)   . Hypothyroidism   . Hemorrhoids     Social History   Social History  . Marital Status: Married    Spouse Name: N/A  . Number of Children: 2  . Years of Education:  N/A   Occupational History  . LAWYER    Social History Main Topics  . Smoking status: Never Smoker   . Smokeless tobacco: Never Used  . Alcohol Use: 0.0 oz/week     Comment:  < 3 /week , only socially  . Drug Use: No  . Sexual Activity: Not on file   Other Topics Concern  . Not on file   Social History Narrative   Daily caffeine     Past Surgical History  Procedure Laterality Date  . Wisdom tooth extraction    . Colonoscopy  2010    Tics    Family History  Problem Relation Age of Onset  .  Breast cancer Mother   . Coronary artery disease Father   . Coronary artery disease Paternal Aunt   . Stroke Maternal Grandmother 79  . Diabetes Neg Hx   . Hypertension Neg Hx   . Colon cancer Neg Hx   . Congestive Heart Failure Father     No Known Allergies  Current Outpatient Prescriptions on File Prior to Visit  Medication Sig Dispense Refill  . aspirin EC 81 MG tablet Take 1 tablet (81 mg total) by mouth daily.    . finasteride (PROPECIA) 1 MG tablet Take 1 mg by mouth daily.  11  . levothyroxine (SYNTHROID, LEVOTHROID) 75 MCG tablet 1 daily EXCEPT  2 tablets on Tuesday, Thursday and Saturday 108 tablet 1  . Loratadine (CLARITIN) 10 MG CAPS Take 1 capsule by mouth daily.     . Multiple Vitamin (MULTIVITAMIN) tablet Take 1 tablet by mouth daily.      . rosuvastatin (CRESTOR) 5 MG tablet 1 tablet ( ) by mouth every other day 45 tablet 0   No current facility-administered medications on file prior to visit.    BP 122/90 mmHg  Pulse 78  Temp(Src) 97.9 F (36.6 C) (Oral)  Ht  (1.803 m)  Wt 214 lb (97.07 kg)  BMI 29.86 kg/m2  SpO2 95%        Objective:   Physical Exam  Constitutional: He is oriented to person, place, and time. He appears well-developed and well-nourished.  Neck: Normal range of motion.  Cardiovascular: Normal rate, regular rhythm and intact distal pulses.   Pulmonary/Chest: Effort normal and breath sounds normal.  Pertinent findings: Grade 1 systolic murmur noted at lower left sternal border.     Abdominal: Soft. Bowel sounds are normal. There is no tenderness.  Lymphadenopathy:    He has no cervical adenopathy.  Neurological: He is alert and oriented to person, place, and time.  Skin: Skin is warm and dry.  Psychiatric: He has a normal mood and affect. His behavior is normal.          Assessment & Plan:  1. Hypothyroidism, unspecified hypothyroidism type Lab work will be obtained in 6-8 weeks to monitor new levothyroxine therapy  dose.  - TSH; Future - T4, free; Future  2. Other fatigue   Encouraged patient to continue with exercise and monitor for any cardio pulmonary symptoms. Advised patient to keep follow up appointment with cardiology to manage statin therapy as scheduled. - TSH; Future - T4, free; Future  3. Encounter to establish care Continue medication regimen as prescribed. Follow up with cardiology for future lipid evaluation as scheduled. Return to clinic at [facility] for thyroid blood work. Advised patient to follow up with if any new symptoms develop, otherwise lab results will be sent to him via MyChart and next appointment will be  based on lab findings.

## 2015-09-27 NOTE — Progress Notes (Signed)
Pre visit review using our clinic review tool, if applicable. No additional management support is needed unless otherwise documented below in the visit note. 

## 2015-10-26 ENCOUNTER — Other Ambulatory Visit (INDEPENDENT_AMBULATORY_CARE_PROVIDER_SITE_OTHER): Payer: BLUE CROSS/BLUE SHIELD | Admitting: *Deleted

## 2015-10-26 ENCOUNTER — Other Ambulatory Visit: Payer: Self-pay | Admitting: Cardiology

## 2015-10-26 ENCOUNTER — Other Ambulatory Visit: Payer: Self-pay | Admitting: Internal Medicine

## 2015-10-26 DIAGNOSIS — I422 Other hypertrophic cardiomyopathy: Secondary | ICD-10-CM | POA: Diagnosis not present

## 2015-10-26 LAB — HEPATIC FUNCTION PANEL
ALBUMIN: 3.7 g/dL (ref 3.6–5.1)
ALK PHOS: 50 U/L (ref 40–115)
ALT: 24 U/L (ref 9–46)
AST: 27 U/L (ref 10–35)
BILIRUBIN DIRECT: 0.1 mg/dL (ref <=0.2)
BILIRUBIN TOTAL: 0.7 mg/dL (ref 0.2–1.2)
Indirect Bilirubin: 0.6 mg/dL (ref 0.2–1.2)
Total Protein: 6.5 g/dL (ref 6.1–8.1)

## 2015-10-26 LAB — LIPID PANEL
Cholesterol: 167 mg/dL (ref 125–200)
HDL: 47 mg/dL (ref >=40)
LDL Cholesterol: 91 mg/dL (ref <130)
Total CHOL/HDL Ratio: 3.6 Ratio (ref <=5.0)
Triglycerides: 146 mg/dL (ref <150)
VLDL: 29 mg/dL (ref <30)

## 2015-10-26 NOTE — Addendum Note (Signed)
Addended by: Tonita PhoenixBOWDEN, ROBIN K on: 10/26/2015 07:59 AM   Modules accepted: Orders

## 2015-10-26 NOTE — Telephone Encounter (Signed)
Medication Detail      Disp Refills Start End     rosuvastatin (CRESTOR) 5 MG tablet 45 tablet 0 08/27/2015     Sig: 1 tablet (5mg ) by mouth every other day    E-Prescribing Status: Receipt confirmed by pharmacy (08/27/2015 2:34 PM EST)     Associated Diagnoses    Other hypertrophic cardiomyopathy [facility](HCC) - Primary       Pharmacy    [facility], [place] - , [place] - [id number]

## 2015-10-28 NOTE — Telephone Encounter (Signed)
Okay to refill? 

## 2015-11-01 ENCOUNTER — Other Ambulatory Visit: Payer: Self-pay | Admitting: *Deleted

## 2015-11-01 ENCOUNTER — Other Ambulatory Visit: Payer: Self-pay | Admitting: Cardiology

## 2015-11-01 ENCOUNTER — Encounter: Payer: Self-pay | Admitting: Family Medicine

## 2015-11-01 NOTE — Telephone Encounter (Signed)
Lab tests have been ordered. He can stop by the lab at his convenience and results will be reported to him within one week or sooner if needed.

## 2015-11-01 NOTE — Telephone Encounter (Signed)
Please advise 

## 2015-11-04 ENCOUNTER — Other Ambulatory Visit (INDEPENDENT_AMBULATORY_CARE_PROVIDER_SITE_OTHER): Payer: BLUE CROSS/BLUE SHIELD

## 2015-11-04 DIAGNOSIS — R5383 Other fatigue: Secondary | ICD-10-CM | POA: Diagnosis not present

## 2015-11-04 DIAGNOSIS — E039 Hypothyroidism, unspecified: Secondary | ICD-10-CM

## 2015-11-04 LAB — T4, FREE: Free T4: 0.77 ng/dL (ref 0.60–1.60)

## 2015-11-04 LAB — TSH: TSH: 2.13 u[IU]/mL (ref 0.35–4.50)

## 2015-11-08 ENCOUNTER — Encounter: Payer: Self-pay | Admitting: Family Medicine

## 2015-11-22 NOTE — Telephone Encounter (Signed)
Your lab results are within normal limits. If you continue to experience fatigue, please schedule a follow up appointment. Also, I recommend contacting your cardiologist about follow up if fatigue continues.   Please let me know if you have any additional questions or concerns.   Thank you,  Raynelle FanningJulie

## 2016-01-24 ENCOUNTER — Other Ambulatory Visit: Payer: Self-pay | Admitting: Cardiology

## 2016-03-01 DIAGNOSIS — L570 Actinic keratosis: Secondary | ICD-10-CM | POA: Diagnosis not present

## 2016-03-01 DIAGNOSIS — I788 Other diseases of capillaries: Secondary | ICD-10-CM | POA: Diagnosis not present

## 2016-03-01 DIAGNOSIS — C44612 Basal cell carcinoma of skin of right upper limb, including shoulder: Secondary | ICD-10-CM | POA: Diagnosis not present

## 2016-03-01 DIAGNOSIS — L821 Other seborrheic keratosis: Secondary | ICD-10-CM | POA: Diagnosis not present

## 2016-03-03 NOTE — Progress Notes (Signed)
This encounter was created in error - please disregard.

## 2016-04-18 ENCOUNTER — Other Ambulatory Visit: Payer: Self-pay | Admitting: Family Medicine

## 2016-04-19 ENCOUNTER — Other Ambulatory Visit: Payer: Self-pay | Admitting: Family Medicine

## 2016-04-19 ENCOUNTER — Telehealth: Payer: Self-pay | Admitting: Family Medicine

## 2016-04-19 DIAGNOSIS — E039 Hypothyroidism, unspecified: Secondary | ICD-10-CM

## 2016-04-19 NOTE — Telephone Encounter (Signed)
Pt would like a tsh lab due to the fact he is feeling a little sluggish.  Pt would like to do that lab tomorrow morning, because he is going out of town for 3 days  Pt would like to go to [facility] if the orders can be put in.

## 2016-04-19 NOTE — Telephone Encounter (Signed)
Okay to order?

## 2016-04-20 NOTE — Progress Notes (Signed)
Called and left message for pt to return call to office.  

## 2016-04-24 NOTE — Progress Notes (Signed)
Called and left voicemail for pt to return call to office.  

## 2016-04-25 NOTE — Progress Notes (Signed)
Pt called, smoke with pt informing him that orders have been place. Pt verbalized understanding. Pt states he would like to go to the [facility] office for the lab work.

## 2016-04-25 NOTE — Progress Notes (Signed)
Error* Spoke

## 2016-04-26 ENCOUNTER — Other Ambulatory Visit (INDEPENDENT_AMBULATORY_CARE_PROVIDER_SITE_OTHER): Payer: BLUE CROSS/BLUE SHIELD

## 2016-04-26 DIAGNOSIS — E039 Hypothyroidism, unspecified: Secondary | ICD-10-CM

## 2016-04-26 LAB — TSH: TSH: 6.74 u[IU]/mL — ABNORMAL HIGH (ref 0.35–4.50)

## 2016-04-26 LAB — T4, FREE: FREE T4: 0.63 ng/dL (ref 0.60–1.60)

## 2016-04-27 ENCOUNTER — Encounter: Payer: Self-pay | Admitting: Family Medicine

## 2016-04-27 ENCOUNTER — Ambulatory Visit (INDEPENDENT_AMBULATORY_CARE_PROVIDER_SITE_OTHER): Payer: BLUE CROSS/BLUE SHIELD | Admitting: Family Medicine

## 2016-04-27 VITALS — BP 130/90 | HR 80 | Temp 98.2°F | Wt 216.0 lb

## 2016-04-27 DIAGNOSIS — K5900 Constipation, unspecified: Secondary | ICD-10-CM | POA: Diagnosis not present

## 2016-04-27 DIAGNOSIS — E785 Hyperlipidemia, unspecified: Secondary | ICD-10-CM

## 2016-04-27 DIAGNOSIS — E039 Hypothyroidism, unspecified: Secondary | ICD-10-CM

## 2016-04-27 DIAGNOSIS — R5383 Other fatigue: Secondary | ICD-10-CM

## 2016-04-27 NOTE — Progress Notes (Signed)
Pre visit review using our clinic review tool, if applicable. No additional management support is needed unless otherwise documented below in the visit note. 

## 2016-04-27 NOTE — Patient Instructions (Addendum)
Please take 75 mcg daily for 4 days each week and 150 mcg daily for 3 days each week.  This dose means that you will take 1 tablets for 4 days in the week and 2 tablets for 3 days each week.  Follow up in 8 to 10 week for lab work to evaluate effectiveness of medication change.   Hypothyroidism Hypothyroidism is a disorder of the thyroid. The thyroid is a large gland that is located in the lower front of the neck. The thyroid releases hormones that control how the body works. With hypothyroidism, the thyroid does not make enough of these hormones. CAUSES Causes of hypothyroidism may include:  Viral infections.  Pregnancy.  Your own defense system (immune system) attacking your thyroid.  Certain medicines.  Birth defects.  Past radiation treatments to your head or neck.  Past treatment with radioactive iodine.  Past surgical removal of part or all of your thyroid.  Problems with the gland that is located in the center of your brain (pituitary). SIGNS AND SYMPTOMS Signs and symptoms of hypothyroidism may include:  Feeling as though you have no energy (lethargy).  Inability to tolerate cold.  Weight gain that is not explained by a change in diet or exercise habits.  Dry skin.  Coarse hair.  Menstrual irregularity.  Slowing of thought processes.  Constipation.  Sadness or depression. DIAGNOSIS  Your health care provider may diagnose hypothyroidism with blood tests and ultrasound tests. TREATMENT Hypothyroidism is treated with medicine that replaces the hormones that your body does not make. After you begin treatment, it may take several weeks for symptoms to go away. HOME CARE INSTRUCTIONS   Take medicines only as directed by your health care provider.  If you start taking any new medicines, tell your health care provider.  Keep all follow-up visits as directed by your health care provider. This is important. As your condition improves, your dosage needs may  change. You will need to have blood tests regularly so that your health care provider can watch your condition. SEEK MEDICAL CARE IF:  Your symptoms do not get better with treatment.  You are taking thyroid replacement medicine and:  You sweat excessively.  You have tremors.  You feel anxious.  You lose weight rapidly.  You cannot tolerate heat.  You have emotional swings.  You have diarrhea.  You feel weak. SEEK IMMEDIATE MEDICAL CARE IF:   You develop chest pain.  You develop an irregular heartbeat.  You develop a rapid heartbeat.   This information is not intended to replace advice given to you by your health care provider. Make sure you discuss any questions you have with your health care provider.   Document Released: 07/31/2005 Document Revised: 08/21/2014 Document Reviewed: 12/16/2013 Elsevier Interactive Patient Education [employer]2016 Elsevier Inc.

## 2016-04-27 NOTE — Progress Notes (Addendum)
Subjective:    Patient ID: Chase Hill, male    DOB: 1945-10-07, 70 y.o.   MRN: [id number]  HPI  Mr. Stemmer is a 70 year old male who presents today due to increasing fatigue that is noticeable over the past month and recent TSH lab results. Chronic management and follow up hyperlipidemia and hypothyroidism   Fatigue: Report of fatigue that is present on a daily basis for the past month. He denies any chest pain, palpitations, SOB, edema, headaches, nosebleeds, N/V, hematochezia, melena, or any unusual bleeding. He reports that this has not interfered with his daily activities but he has noticed this increasing and was interested in having his TSH level checked.  Hypothyroidism: Currently, he is taking 75 mcg levothyroxine daily 5 days/week and 150 mcg levothyroxine 2 days/week for a total of 675 mcg weekly. He reports taking this on an empty stomach with water and will wait at least 30 minutes before eating breakfast.  He reports that fatigue usually occurs when changes in his TSH level are present. Associated symptom of constipation is present today. He denies any changes in skin but has noticed thinning of his hair. He denies sensitivity to hot/cold. Lab Results  Component Value Date   TSH 6.74 (H) 04/26/2016     Treadmill 2 days/week and plays golf weekly walking approximately 2 miles during this time. He denies any cardiopulmonary symptoms with exercise.  Hyperlipidemia: He is followed by cardiology and is taking Crestor 5mg  daily. He denies c/o muscle aches/pains. He will follow up for his lipid levels with cardiology.   He maintains a heart healthy diet and drinks water daily  Constipation: Reports that bowels are slow and he will notice this when his TSH increases. Fiber supplementation and water produces bowel movement. He denies N/V, abdominal pain, tenderness, hematochezia, or melena.   Review of Systems  Constitutional: Positive for fatigue. Negative for chills and  fever.  Respiratory: Negative for cough and shortness of breath.   Cardiovascular: Negative for chest pain, palpitations and leg swelling.  Gastrointestinal: Positive for constipation. Negative for abdominal pain, diarrhea, nausea and rectal pain.  Endocrine: Negative for cold intolerance and heat intolerance.  Musculoskeletal: Negative for joint swelling and myalgias.  Skin: Negative for rash.  Neurological: Negative for dizziness, light-headedness, numbness and headaches.     Past Medical History:  Diagnosis Date  . Cervical radiculopathy at C7    PMH of  . Diverticulosis    colonoscopy 10-15-2006  . GERD (gastroesophageal reflux disease)   . Gilbert syndrome   . Hemorrhoids   . Hyperlipidemia   . Hypothyroidism   . Perennial allergic rhinitis with seasonal variation      Social History   Social History  . Marital status: Married    Spouse name: N/A  . Number of children: 2  . Years of education: N/A   Occupational History  . LAWYER    Social History Main Topics  . Smoking status: Never Smoker  . Smokeless tobacco: Never Used  . Alcohol use 0.0 oz/week     Comment:  < 3 /week , only socially  . Drug use: No  . Sexual activity: Not on file   Other Topics Concern  . Not on file   Social History Narrative   Daily caffeine     Past Surgical History:  Procedure Laterality Date  . COLONOSCOPY  2010   Tics  . WISDOM TOOTH EXTRACTION      Family History  Problem Relation Age  of Onset  . Breast cancer Mother   . Coronary artery disease Father   . Coronary artery disease Paternal Aunt   . Stroke Maternal Grandmother [phone]  . Diabetes Neg Hx   . Hypertension Neg Hx   . Colon cancer Neg Hx   . Congestive Heart Failure Father     No Known Allergies  Current Outpatient Prescriptions on File Prior to Visit  Medication Sig Dispense Refill  . aspirin EC 81 MG tablet Take 1 tablet (81 mg total) by mouth daily.    Marland Kitchen. levothyroxine (SYNTHROID, LEVOTHROID) 75 MCG  tablet TAKE 1 TABLET BY MOUTH EVERY DAY EXCEPT ON Tues, Thurs, AND Sat TAKE 1.5 TABLETS. 108 tablet 0  . Loratadine (CLARITIN) 10 MG CAPS Take 1 capsule by mouth daily.     . Multiple Vitamin (MULTIVITAMIN) tablet Take 1 tablet by mouth daily.      . rosuvastatin (CRESTOR) 5 MG tablet TAKE 1 TABLET BY MOUTH EVERY OTHER DAY 45 tablet 1   No current facility-administered medications on file prior to visit.     BP 130/90 (BP Location: Left Arm, Patient Position: Sitting, Cuff Size: Normal)   Pulse 80   Temp 98.2 F (36.8 C) (Oral)   Wt 216 lb (98 kg)   SpO2 97%   BMI 30.13 kg/m       Objective:   Physical Exam  Constitutional: He is oriented to person, place, and time. He appears well-developed and well-nourished.  Eyes: Pupils are equal, round, and reactive to light. No scleral icterus.  Neck: Neck supple.  Cardiovascular: Normal rate and regular rhythm.   Pertinent findings: Grade 1 systolic murmur noted at lower left sternal border.  Pulmonary/Chest: Effort normal and breath sounds normal. He has no wheezes. He has no rales.  Abdominal: Soft. Bowel sounds are normal. There is no tenderness.  Musculoskeletal: He exhibits no edema.  Lymphadenopathy:    He has no cervical adenopathy.  Neurological: He is alert and oriented to person, place, and time. Coordination normal.  Skin: Skin is warm and dry. No rash noted.  Psychiatric: He has a normal mood and affect. His behavior is normal. Judgment and thought content normal.       Assessment & Plan:  1. Hypothyroidism, unspecified hypothyroidism type Suspect fatigue and constipation are related to recent increase in TSH. Advised patient to increase his levothyroxine dose to 75 mcg daily for 4 days and 150 mcg daily for 3 days. He will return for lab work in 6 to 8 weeks to monitor levothyroxine therapy change. Reviewed with patient how to properly take this medication without food and to drink water only when taking this  medication. Further advised patient that if symptom of fatigue continues; follow up appointment will be needed with cardiology. He denies any cardiopulmonary symptoms today and with activity.  - T4, free; Future - TSH; Future  2. Hyperlipidemia Continue Crestor 5 mg as prescribed. Follow up for lipid evaluation after completion of  Levothyroxine adjustment.   3. Other fatigue Monitor symptom with increased dose of levothyroxine and if symptom persists or worsens, please follow up for further evaluation. If symptom of fatigue does not improve with levothyroxine dose change; additional lab work up with be completed. Patient is interested in TSH only at this time and will follow up as directed.  4. Constipation, unspecified constipation type Increased water and fiber supplementation. He currently maintains adequate fiber intake with his diet. If symptom does not improve with levothyroxine adjustment;  Follow  up for further evaluation and treatment.  Next follow up lab work in 8 to 10 weeks or sooner if needed.  Roddie Mc, FNP-C

## 2016-05-10 DIAGNOSIS — Z23 Encounter for immunization: Secondary | ICD-10-CM | POA: Diagnosis not present

## 2016-06-29 ENCOUNTER — Other Ambulatory Visit (INDEPENDENT_AMBULATORY_CARE_PROVIDER_SITE_OTHER): Payer: BLUE CROSS/BLUE SHIELD

## 2016-06-29 DIAGNOSIS — E039 Hypothyroidism, unspecified: Secondary | ICD-10-CM

## 2016-06-29 LAB — TSH: TSH: 3.33 u[IU]/mL (ref 0.35–4.50)

## 2016-06-29 LAB — T4, FREE: Free T4: 0.82 ng/dL (ref 0.60–1.60)

## 2016-07-14 ENCOUNTER — Other Ambulatory Visit: Payer: Self-pay

## 2016-07-14 ENCOUNTER — Telehealth: Payer: Self-pay | Admitting: Family Medicine

## 2016-07-14 ENCOUNTER — Other Ambulatory Visit: Payer: Self-pay | Admitting: Cardiology

## 2016-07-14 ENCOUNTER — Other Ambulatory Visit: Payer: Self-pay | Admitting: Family Medicine

## 2016-07-14 MED ORDER — LEVOTHYROXINE SODIUM 75 MCG PO TABS: ORAL_TABLET | ORAL | 2 refills | Status: DC

## 2016-07-14 NOTE — Telephone Encounter (Signed)
[facility]

## 2016-07-14 NOTE — Telephone Encounter (Signed)
Refill e-scribed doesn't match what pt stated it should.  Pt stated to pharmacy that it should 1 tab Mon, Weds, Fri, Sunday and 2 tabs on Tues, Thurs, Sat.

## 2016-07-21 ENCOUNTER — Telehealth: Payer: Self-pay | Admitting: Family Medicine

## 2016-07-21 NOTE — Telephone Encounter (Signed)
Pt would like to have CP[facility] labs done [facility] he has CPE on 08/28/16 pt state that it is closer to him.  Is it okay.

## 2016-07-22 NOTE — Telephone Encounter (Signed)
Lab work at [facility] that is more convenient for the patient is okay with me. If any additional information is needed, just let me know.  Thanks

## 2016-07-24 ENCOUNTER — Other Ambulatory Visit: Payer: Self-pay | Admitting: Family Medicine

## 2016-07-24 DIAGNOSIS — Z0000 Encounter for general adult medical examination without abnormal findings: Secondary | ICD-10-CM

## 2016-07-24 NOTE — Telephone Encounter (Signed)
Spoke to patient verbalized understanding that he can go to [facility] lab for his blood work per [employer].

## 2016-07-24 NOTE — Progress Notes (Signed)
Lab orders placed at [facility] for upcoming [facility] per patient request.

## 2016-07-25 NOTE — Progress Notes (Signed)
Patient was notified and verbalized understanding of his Lab appointment at [facility] office.

## 2016-08-24 ENCOUNTER — Other Ambulatory Visit (INDEPENDENT_AMBULATORY_CARE_PROVIDER_SITE_OTHER): Payer: BLUE CROSS/BLUE SHIELD

## 2016-08-24 DIAGNOSIS — Z0000 Encounter for general adult medical examination without abnormal findings: Secondary | ICD-10-CM

## 2016-08-24 LAB — BASIC METABOLIC PANEL
BUN: 22 mg/dL (ref 6–23)
CHLORIDE: 103 meq/L (ref 96–112)
CO2: 28 meq/L (ref 19–32)
Calcium: 9.6 mg/dL (ref 8.4–10.5)
Creatinine, Ser: 1.34 mg/dL (ref 0.40–1.50)
GFR: 55.67 mL/min — ABNORMAL LOW (ref >60.00)
Glucose, Bld: 100 mg/dL — ABNORMAL HIGH (ref 70–99)
POTASSIUM: 4.7 meq/L (ref 3.5–5.1)
Sodium: 140 mEq/L (ref 135–145)

## 2016-08-24 LAB — CBC WITH DIFFERENTIAL/PLATELET
BASOS ABS: 0 10*3/uL (ref 0.0–0.1)
Basophils Relative: 0.3 % (ref 0.0–3.0)
EOS ABS: 0.2 10*3/uL (ref 0.0–0.7)
Eosinophils Relative: 2.7 % (ref 0.0–5.0)
HEMATOCRIT: 45.7 % (ref 39.0–52.0)
Hemoglobin: 15.6 g/dL (ref 13.0–17.0)
LYMPHS ABS: 1.9 10*3/uL (ref 0.7–4.0)
LYMPHS PCT: 24.6 % (ref 12.0–46.0)
MCHC: 34.3 g/dL (ref 30.0–36.0)
MCV: 91.2 fl (ref 78.0–100.0)
Monocytes Absolute: 1 10*3/uL (ref 0.1–1.0)
Monocytes Relative: 12.8 % — ABNORMAL HIGH (ref 3.0–12.0)
NEUTROS ABS: 4.6 10*3/uL (ref 1.4–7.7)
Neutrophils Relative %: 59.6 % (ref 43.0–77.0)
PLATELETS: 192 10*3/uL (ref 150.0–400.0)
RBC: 5.01 Mil/uL (ref 4.22–5.81)
RDW: 13.5 % (ref 11.5–15.5)
WBC: 7.7 10*3/uL (ref 4.0–10.5)

## 2016-08-24 LAB — LIPID PANEL
Cholesterol: 167 mg/dL (ref 0–200)
HDL: 51 mg/dL (ref >39.00)
LDL Cholesterol: 95 mg/dL (ref 0–99)
NONHDL: 116.12
Total CHOL/HDL Ratio: 3
Triglycerides: 108 mg/dL (ref 0.0–149.0)
VLDL: 21.6 mg/dL (ref 0.0–40.0)

## 2016-08-24 LAB — HEPATIC FUNCTION PANEL
ALT: 27 U/L (ref 0–53)
AST: 24 U/L (ref 0–37)
Albumin: 4 g/dL (ref 3.5–5.2)
Alkaline Phosphatase: 57 U/L (ref 39–117)
BILIRUBIN DIRECT: 0.1 mg/dL (ref 0.0–0.3)
BILIRUBIN TOTAL: 0.7 mg/dL (ref 0.2–1.2)
TOTAL PROTEIN: 7.2 g/dL (ref 6.0–8.3)

## 2016-08-24 LAB — TSH: TSH: 3.64 u[IU]/mL (ref 0.35–4.50)

## 2016-08-24 LAB — HEMOGLOBIN A1C: Hgb A1c MFr Bld: 5.5 % (ref 4.6–6.5)

## 2016-08-28 ENCOUNTER — Ambulatory Visit (INDEPENDENT_AMBULATORY_CARE_PROVIDER_SITE_OTHER): Payer: BLUE CROSS/BLUE SHIELD | Admitting: Family Medicine

## 2016-08-28 ENCOUNTER — Encounter: Payer: Self-pay | Admitting: Family Medicine

## 2016-08-28 VITALS — BP 106/80 | HR 74 | Temp 98.7°F | Ht 73.0 in | Wt 217.8 lb

## 2016-08-28 DIAGNOSIS — Z0001 Encounter for general adult medical examination with abnormal findings: Secondary | ICD-10-CM | POA: Diagnosis not present

## 2016-08-28 DIAGNOSIS — K5900 Constipation, unspecified: Secondary | ICD-10-CM

## 2016-08-28 NOTE — Patient Instructions (Signed)
It was a pleasure seeing you today!  Miralax can be used for constipation if needed.  Also, please follow up with cardiology as we discussed.  If you have any questions or concerns, please feel free to contact me.

## 2016-08-28 NOTE — Progress Notes (Signed)
Subjective:    Patient ID: Chase Hill, male    DOB: 1945/10/31, 71 y.o.   MRN: [id number]  HPI  Chase Hill is a 71 year old male who is here today for a routine physical.   He follows a modified heart healthy diet and restricts red meat and salt.  He walks at least twice/week and plays golf every week and carries his clubs. He exercises 45 minutes 2 to 3 times/week without cardiopulmonary symptoms.    Constipation is noted this week and he states that he has increased fiber in his diet which has improved frequency of BMs. He denies small caliber stool or blood in stool. He denies N/V, abdominal pain or increased flatus.  Review of Systems   Constitutional: No fever, chills, significant weight change, fatigue, weakness or night sweats Eyes: No redness, discharge, pain, blurred vision, double vision, or loss of vision ENT/mouth: No nasal congestion, postnasal drainage,epistaxis, purulent discharge, earache, hearing loss, tinnitus ,sore throat , dental pain, or hoarseness   Cardiovascular: no chest pain, palpitations, racing, irregular rhythm, syncope, nausea, sweating, claudication, or edema  Respiratory: No cough, sputum production,hemoptysis,  dyspnea, paroxysmal nocturnal dyspnea, pleuritic chest pain, significant snoring, or  apnea    Gastrointestinal: No heartburn,dysphagia, nausea and vomiting,ominal pain, change in bowels, anorexia, diarrhea, significant constipation, rectal bleeding, melena,  stool incontinence or jaundice Genitourinary: No dysuria,hematuria, pyuria, frequency, urgency,  incontinence, nocturia, dark urine or flank pain Musculoskeletal: No myalgias or muscle cramping, joint stiffness, joint swelling, joint color change, weakness, or cyanosis Dermatologic: No rash, pruritus, urticaria, or change in color or temperature of skin Neurologic: No headache, vertigo, limb weakness, tremor, gait disturbance, seizures, memory loss, numbness or tingling Psychiatric:  No significant anxiety or depression, anhedonia, panic attacks, insomnia, or anorexia Endocrine: No change in hair/skin/ nails, excessive thirst, excessive hunger, excessive urination, or unexplained fatigue Hematologic/lymphatic: No bruising, lymphadenopathy,or  abnormal clotting Allergy/immunology: No itchy/ watery eyes, abnormal sneezing, rhinitis, urticaria ,or angioedema  Past Medical History:  Diagnosis Date  . Cervical radiculopathy at C7    PMH of  . Diverticulosis    colonoscopy 10-15-2006  . GERD (gastroesophageal reflux disease)   . Gilbert syndrome   . Hemorrhoids   . Hyperlipidemia   . Hypothyroidism   . Perennial allergic rhinitis with seasonal variation      Social History   Social History  . Marital status: Married    Spouse name: N/A  . Number of children: 2  . Years of education: N/A   Occupational History  . LAWYER    Social History Main Topics  . Smoking status: Never Smoker  . Smokeless tobacco: Never Used  . Alcohol use 1.2 oz/week    2 Glasses of wine per week     Comment:  < 3 /week , only socially  . Drug use: No  . Sexual activity: Not on file   Other Topics Concern  . Not on file   Social History Narrative   Daily caffeine     Past Surgical History:  Procedure Laterality Date  . COLONOSCOPY  2010   Tics  . WISDOM TOOTH EXTRACTION      Family History  Problem Relation Age of Onset  . Stroke Maternal Grandmother 95  . Breast cancer Mother   . Coronary artery disease Father   . Congestive Heart Failure Father   . Coronary artery disease Paternal Aunt   . Diabetes Neg Hx   . Hypertension Neg Hx   . Colon  cancer Neg Hx     No Known Allergies  Current Outpatient Prescriptions on File Prior to Visit  Medication Sig Dispense Refill  . aspirin EC 81 MG tablet Take 1 tablet (81 mg total) by mouth daily.    Marland Kitchen levothyroxine (SYNTHROID, LEVOTHROID) 75 MCG tablet TAKE 1 TAB BY MOUTH ON MON,WED,FRI AND SUN. TAKE 2 TABLETS ON TUES, THURS,  AND SAT 108 tablet 2  . Loratadine (CLARITIN) 10 MG CAPS Take 1 capsule by mouth daily.     . Multiple Vitamin (MULTIVITAMIN) tablet Take 1 tablet by mouth daily.      . rosuvastatin (CRESTOR) 5 MG tablet TAKE 1 TABLET BY MOUTH EVERY OTHER DAY 15 tablet 0   No current facility-administered medications on file prior to visit.     BP 106/80 (BP Location: Left Arm, Patient Position: Sitting, Cuff Size: Normal)   Pulse 74   Temp 98.7 F (37.1 C) (Oral)   Ht 6\' 1"  (1.854 m)   Wt 217 lb 12.8 oz (98.8 kg)   SpO2 97%   BMI 28.74 kg/m       Objective:   Physical Exam  Physical Exam    Constitutional: He is oriented to person, place, and time. He appears well-developed and well-nourished. No distress.  HENT:  Head: Normocephalic and atraumatic.  Right Ear: Tympanic membrane and ear canal normal.  Left Ear: Tympanic membrane and ear canal normal.  Mouth/Throat: Oropharynx is clear and moist.  Eyes: Pupils are equal, round, and reactive to light. No scleral icterus.  Neck: Normal range of motion. No thyromegaly present.  Cardiovascular: Normal rate and regular rhythm.   Grade 1 SEM heard at lower left sternal border. Pulmonary/Chest: Effort normal and breath sounds normal. No respiratory distress. He has no wheezes. He has no rales. He exhibits no tenderness.  Abdominal: Soft. Bowel sounds are normal. He exhibits no distension and no mass. There is no tenderness. There is no rebound and no guarding.  Musculoskeletal: He exhibits no edema. Crepitus in knees bilaterally Lymphadenopathy:    He has no cervical adenopathy.  Neurological: He is alert and oriented to person, place, and time. He has normal reflexes. He exhibits normal muscle tone. Coordination normal.  Skin: Skin is warm and dry.  Psychiatric: He has a normal mood and affect. His behavior is normal. Judgment and thought content normal.  Prostate: Small without induration or nodularity.       Assessment & Plan:  1.  Encounter for general adult medical examination with abnormal findings 71 y.o. male presenting for annual physical.  Health Maintenance counseling: 1. Anticipatory guidance: Patient counseled regarding regular dental exams, eye exams, wearing seatbelts.  2. Risk factor reduction:  Advised patient of need for regular exercise and diet rich and fruits and vegetables to reduce risk of heart attack and stroke.  3. Immunizations/screenings/ancillary studies Immunization History  Administered Date(s) Administered  . Influenza Whole 08/14/2001, 09/07/2010  . Influenza-Unspecified 05/14/2013, 05/18/2015  . Pneumococcal Conjugate-13 04/28/2015  . Pneumococcal Polysaccharide-23 10/31/2013   Health Maintenance Due  Topic Date Due  . Hepatitis C Screening  Oct 18, 1945  . TETANUS/TDAP  06/28/1963  . ZOSTAVAX  06/27/2004  . INFLUENZA VACCINE  03/14/2016   4. Prostate cancer screening- No PSA recommended after age 80 Lab Results  Component Value Date   PSA 0.29 08/31/2010   PSA 0.28 07/30/2009   PSA 0.19 01/17/2007   5. Colon cancer screening - Due in 2017 6. Skin cancer screening- Sees dermatology every year in July  2. Constipation, unspecified constipation type Advised use of Miralax for improving frequency of BMs as discussed; increase in fiber and water recommended. TSH WNL within recent lab work.   He is followed by cardiology and is taking Crestor three times/week without adverse effects. Advised continued use of Crestor and follow up with cardiology as recommended.  Roddie McJulia Calin Ellery, FNP-C

## 2016-08-28 NOTE — Progress Notes (Signed)
Pre visit review using our clinic review tool, if applicable. No additional management support is needed unless otherwise documented below in the visit note. 

## 2016-09-18 ENCOUNTER — Encounter: Payer: Self-pay | Admitting: Gastroenterology

## 2016-09-27 ENCOUNTER — Encounter: Payer: Self-pay | Admitting: Family Medicine

## 2016-10-10 ENCOUNTER — Other Ambulatory Visit: Payer: Self-pay | Admitting: Cardiology

## 2016-10-12 ENCOUNTER — Other Ambulatory Visit: Payer: Self-pay | Admitting: Family Medicine

## 2016-12-04 ENCOUNTER — Encounter: Payer: Self-pay | Admitting: Family Medicine

## 2016-12-04 ENCOUNTER — Other Ambulatory Visit: Payer: Self-pay | Admitting: Family Medicine

## 2016-12-04 DIAGNOSIS — E039 Hypothyroidism, unspecified: Secondary | ICD-10-CM

## 2016-12-04 NOTE — Progress Notes (Unsigned)
Thyroid function lab orders have been placed. [name] can make an appointment to have this lab work completed. Also, I recommend that he also schedule a follow up with cardiology as previously discussed.

## 2016-12-04 NOTE — Telephone Encounter (Signed)
Chase Hill - Please advise on thyroid testing. Per chart it does not appear pt has followed up with cardiology as recommended.   Thanks!

## 2016-12-04 NOTE — Progress Notes (Signed)
Spoke with patient verbalized understanding that thyroid function lab orders have been placed and that he needs to schedule a follow up with cardiology.

## 2016-12-05 ENCOUNTER — Other Ambulatory Visit (INDEPENDENT_AMBULATORY_CARE_PROVIDER_SITE_OTHER): Payer: BLUE CROSS/BLUE SHIELD

## 2016-12-05 DIAGNOSIS — E039 Hypothyroidism, unspecified: Secondary | ICD-10-CM | POA: Diagnosis not present

## 2016-12-05 LAB — TSH: TSH: 3.82 u[IU]/mL (ref 0.35–4.50)

## 2016-12-05 LAB — T4, FREE: FREE T4: 0.84 ng/dL (ref 0.60–1.60)

## 2016-12-16 ENCOUNTER — Other Ambulatory Visit: Payer: Self-pay | Admitting: Family Medicine

## 2016-12-19 NOTE — Telephone Encounter (Signed)
Called patient in regards to his request for refills on Rosuvastatin 5 mg, left a message for a return call in the office.

## 2016-12-28 NOTE — Telephone Encounter (Signed)
Patient is requesting refills on Rosuvastatin 5 mg. Please Advise.

## 2017-01-17 ENCOUNTER — Encounter: Payer: Self-pay | Admitting: Family Medicine

## 2017-01-18 ENCOUNTER — Telehealth: Payer: Self-pay

## 2017-01-18 NOTE — Telephone Encounter (Signed)
Spoke with patient following up with the signing of the release of information form, patient stated that he came to the clinic this morning and signed the paperwork.

## 2017-02-12 ENCOUNTER — Encounter: Payer: Self-pay | Admitting: Family Medicine

## 2017-02-24 NOTE — Progress Notes (Signed)
Cardiology Office Note   Date:  02/26/2017   ID:  Chase Hill, DOB June 12, 1946, MRN [id number]  PCP:  Roddie McKordsmeier, Julia, FNP  Cardiologist:   Royalty Fakhouri [place], MD   Chief Complaint  Patient presents with  . Follow-up      History of Present Illness: Chase Hill is a 71 y.o. male who is seen for follow up HCM. He is a former patient of Dr. Shirlee LatchMclean. He was evaluated in 2011 for chest pain. Stress Myoview showed inferior/apical ischemia but cardiac cath was done and showed no obstructive CAD. In 2014 he had an Echo done. This showed basal septal hypertrophy with mild LV outflow tract gradient and chordal versus valvular SAM, trivial MR, and moderate diastolic dysfunction.  He had cardiac MRI in 6/14 showing mild focal basal septal hypertrophy with systolic anterior motion of the mitral valve and mild MR.  There was no delayed enhancement. He was tried on a beta blocker but was unable to tolerate this due to fatigue and orthostasis. He has a history of HLD, GERD, RBBB, and hypothyroidism. Intolerant of pravastatin due to myalgias.  On follow up today he reports he feels very well. No chest pain, palpitations, dyspnea, fatigue. Stays active playing golf- carries his bag and exercises 2 days a week. He is still working as a Clinical research associatelawyer - Designer, fashion/clothingtax and corporate.    Past Medical History:  Diagnosis Date  . Cervical radiculopathy at C7    PMH of  . Diverticulosis    colonoscopy 10-15-2006  . GERD (gastroesophageal reflux disease)   . Gilbert syndrome   . Hemorrhoids   . Hyperlipidemia   . Hypothyroidism   . Perennial allergic rhinitis with seasonal variation     Past Surgical History:  Procedure Laterality Date  . COLONOSCOPY  2010   Tics  . WISDOM TOOTH EXTRACTION       Current Outpatient Prescriptions  Medication Sig Dispense Refill  . aspirin EC 81 MG tablet Take 1 tablet (81 mg total) by mouth daily.    Marland Kitchen. levothyroxine (SYNTHROID, LEVOTHROID) 75 MCG tablet TAKE 1 TAB BY MOUTH  ON MON,WED,FRI AND SUN. TAKE 2 TABLETS ON TUES, THURS, AND SAT 108 tablet 2  . Loratadine (CLARITIN) 10 MG CAPS Take 1 capsule by mouth daily.     . Multiple Vitamin (MULTIVITAMIN) tablet Take 1 tablet by mouth daily.      . rosuvastatin (CRESTOR) 5 MG tablet Take 1 tablet (5 mg total) by mouth every other day. *Patient is overdue for an appointment, needs to call and schedule for further refills* 7 tablet 0  . rosuvastatin (CRESTOR) 5 MG tablet TAKE 1 TABLET BY MOUTH EVERY OTHER DAY 30 tablet 6   No current facility-administered medications for this visit.     Allergies:   Patient has no known allergies.    Social History:  The patient  reports that he has never smoked. He has never used smokeless tobacco. He reports that he drinks about 1.2 oz of alcohol per week . He reports that he does not use drugs.   Family History:  The patient's family history includes Breast cancer in his mother; Congestive Heart Failure in his father; Coronary artery disease in his father and paternal aunt; Stroke (age of onset: 6789) in his maternal grandmother.    ROS:  Please see the history of present illness.   Otherwise, review of systems are positive for none.   All other systems are reviewed and negative.  PHYSICAL EXAM: VS:  BP 114/68   Pulse 71   Ht 5\' 11"  (1.803 m)   Wt 216 lb 12.8 oz (98.3 kg)   BMI 30.24 kg/m  , BMI Body mass index is 30.24 kg/m. GEN: Well nourished, well developed, in no acute distress  HEENT: normal  Neck: no JVD, carotid bruits, or masses Cardiac: RRR; Grade 2/6 late peaking harsh systolic murmur LLSB to upper SB. No gallop.  Respiratory:  clear to auscultation bilaterally, normal work of breathing GI: soft, nontender, nondistended, + BS MS: no deformity or atrophy  Skin: warm and dry, no rash Neuro:  Strength and sensation are intact Psych: euthymic mood, full affect   EKG:  EKG is ordered today. The ekg ordered today demonstrates NSR with PACs, LAFB, RBBB. I have  personally reviewed and interpreted this study.    Recent Labs: 08/24/2016: ALT 27; BUN 22; Creatinine, Ser 1.34; Hemoglobin 15.6; Platelets 192.0; Potassium 4.7; Sodium 140 12/05/2016: TSH 3.82    Lipid Panel    Component Value Date/Time   CHOL 167 08/24/2016 0727   CHOL 202 (H) 04/29/2015 0734   TRIG 108.0 08/24/2016 0727   TRIG 165 (H) 04/29/2015 0734   HDL 51.00 08/24/2016 0727   HDL 47 04/29/2015 0734   CHOLHDL 3 08/24/2016 0727   VLDL 21.6 08/24/2016 0727   LDLCALC 95 08/24/2016 0727   LDLCALC 122 (H) 04/29/2015 0734   LDLDIRECT 78.7 03/28/2013 0732      Wt Readings from Last 3 Encounters:  02/26/17 216 lb 12.8 oz (98.3 kg)  08/28/16 217 lb 12.8 oz (98.8 kg)  04/27/16 216 lb (98 kg)      Other studies Reviewed: Echo 11/25/12: Study Conclusions  - Left ventricle: Asymmetrical septal hypertrophy. The cavity size was normal. Systolic function was normal. The estimated ejection fraction was in the range of 55% to 65%. There was dynamic obstruction. Wall motion was normal; there were no regional wall motion abnormalities. Features are consistent with a pseudonormal left ventricular filling pattern, with concomitant abnormal relaxation and increased filling pressure (grade 2 diastolic dysfunction). - Aortic valve: Mild regurgitation. - Mitral valve: There was systolic anterior motion. - Left atrium: The atrium was mildly dilated. - Atrial septum: No defect or patent foramen ovale was identified.  Cardiac MRI 01/31/13: MR CARDIA MORPHOLOGY WITHOUT AND WITH CONTRAST  GE 1.5 T magnet with dedicated cardiac coil.  FIESTA sequences for function and morphology.  10 minutes after 30 cc Multihance was injected, inversion recovery sequences were done to assess for delayed enhancement.  EF was calculated at a dedicated workstation.  Contrast: 30mL MULTIHANCE GADOBENATE DIMEGLUMINE 529 MG/ML IV SOLN  Comparison: None.  Findings: Normal left  ventricular size and systolic function, EF 65%.  Normal wall motion.  There was mild focal basal septal hypertrophy. There was mitral valve systolic anterior motion with mild mitral regurgitation.  Normal right ventricular size and systolic function.  Mild biatrial enlargement.  Trileaflet aortic valve with mild aortic insufficiency.  There was no significant myocardial delayed enhancement.  Measurements:  LV EDV 134 mL  LV SV 87 mL  LV EF 65%  IMPRESSION: 1. Mild focal basal septal hypertrophy with mitral valve systolic anterior motion.  This is suggestive of a hypertrophic cardiomyopathy variant.  2. No myocardial delayed enhancement was visualized.   ASSESSMENT AND PLAN:  1.  HCM- this is a relatively mild variant as he has been totally asymptomatic.  He has a mild LVOT gradient with  ejection murmur on exam.  Cardiac MRI showed mild focal basal septal hypertrophy with mitral valve SAM and mild MR.  There was no myocardial delayed enhancement. Even with very low dose of beta blocker, patient had lightheadedness and fatigue and was unable to to tolerate it. I have recommended a follow up Echo since it has been 4 years since his last study.  2. HLD- well controlled on low dose Crestor.  3. RBBB/LAFB   Current medicines are reviewed at length with the patient today.  The patient does not have concerns regarding medicines.  The following changes have been made:  no change  Labs/ tests ordered today include: Echo  Orders Placed This Encounter  Procedures  . EKG 12-Lead     Disposition:   FU with me in 1 year  Signed, Alesha Jaffee [place], MD  02/26/2017 8:45 AM    [facility] [address] [place], [place], [address] Phone [phone], Fax [phone]

## 2017-02-26 ENCOUNTER — Ambulatory Visit (INDEPENDENT_AMBULATORY_CARE_PROVIDER_SITE_OTHER): Payer: BLUE CROSS/BLUE SHIELD | Admitting: Cardiology

## 2017-02-26 ENCOUNTER — Encounter: Payer: Self-pay | Admitting: Cardiology

## 2017-02-26 VITALS — BP 114/68 | HR 71 | Ht 71.0 in | Wt 216.8 lb

## 2017-02-26 DIAGNOSIS — I422 Other hypertrophic cardiomyopathy: Secondary | ICD-10-CM | POA: Diagnosis not present

## 2017-02-26 DIAGNOSIS — E7800 Pure hypercholesterolemia, unspecified: Secondary | ICD-10-CM | POA: Diagnosis not present

## 2017-02-26 NOTE — Patient Instructions (Addendum)
Continue your current therapy  We will schedule you for an Echocardiogram  I will see you in one year

## 2017-02-27 DIAGNOSIS — L814 Other melanin hyperpigmentation: Secondary | ICD-10-CM | POA: Diagnosis not present

## 2017-02-27 DIAGNOSIS — Z85828 Personal history of other malignant neoplasm of skin: Secondary | ICD-10-CM | POA: Diagnosis not present

## 2017-02-27 DIAGNOSIS — L570 Actinic keratosis: Secondary | ICD-10-CM | POA: Diagnosis not present

## 2017-02-27 DIAGNOSIS — D225 Melanocytic nevi of trunk: Secondary | ICD-10-CM | POA: Diagnosis not present

## 2017-02-27 DIAGNOSIS — L821 Other seborrheic keratosis: Secondary | ICD-10-CM | POA: Diagnosis not present

## 2017-03-08 ENCOUNTER — Other Ambulatory Visit: Payer: Self-pay

## 2017-03-08 ENCOUNTER — Ambulatory Visit (HOSPITAL_COMMUNITY): Payer: BLUE CROSS/BLUE SHIELD | Attending: Cardiology

## 2017-03-08 DIAGNOSIS — E7800 Pure hypercholesterolemia, unspecified: Secondary | ICD-10-CM | POA: Insufficient documentation

## 2017-03-08 DIAGNOSIS — I340 Nonrheumatic mitral (valve) insufficiency: Secondary | ICD-10-CM | POA: Diagnosis not present

## 2017-03-08 DIAGNOSIS — I422 Other hypertrophic cardiomyopathy: Secondary | ICD-10-CM | POA: Insufficient documentation

## 2017-03-08 DIAGNOSIS — I7789 Other specified disorders of arteries and arterioles: Secondary | ICD-10-CM

## 2017-03-08 DIAGNOSIS — I517 Cardiomegaly: Secondary | ICD-10-CM | POA: Diagnosis not present

## 2017-03-08 MED ORDER — DILTIAZEM HCL ER COATED BEADS 120 MG PO CP24: 120.0000 mg | ORAL_CAPSULE | Freq: Every day | ORAL | 3 refills | Status: DC

## 2017-03-09 ENCOUNTER — Other Ambulatory Visit: Payer: Self-pay

## 2017-03-09 DIAGNOSIS — I422 Other hypertrophic cardiomyopathy: Secondary | ICD-10-CM

## 2017-03-13 DIAGNOSIS — I422 Other hypertrophic cardiomyopathy: Secondary | ICD-10-CM | POA: Diagnosis not present

## 2017-03-13 LAB — BASIC METABOLIC PANEL
BUN / CREAT RATIO: 14 (ref 10–24)
BUN: 16 mg/dL (ref 8–27)
CHLORIDE: 103 mmol/L (ref 96–106)
CO2: 22 mmol/L (ref 20–29)
Calcium: 9.1 mg/dL (ref 8.6–10.2)
Creatinine, Ser: 1.14 mg/dL (ref 0.76–1.27)
GFR calc non Af Amer: 64 mL/min/{1.73_m2} (ref >59)
GFR, EST AFRICAN AMERICAN: 74 mL/min/{1.73_m2} (ref >59)
GLUCOSE: 91 mg/dL (ref 65–99)
Potassium: 4.5 mmol/L (ref 3.5–5.2)
SODIUM: 141 mmol/L (ref 134–144)

## 2017-03-20 ENCOUNTER — Other Ambulatory Visit: Payer: Self-pay

## 2017-03-20 DIAGNOSIS — I712 Thoracic aortic aneurysm, without rupture, unspecified: Secondary | ICD-10-CM

## 2017-03-20 HISTORY — DX: Thoracic aortic aneurysm, without rupture, unspecified: I71.20

## 2017-03-20 HISTORY — DX: Thoracic aortic aneurysm, without rupture: I71.2

## 2017-03-21 ENCOUNTER — Telehealth: Payer: Self-pay | Admitting: Cardiology

## 2017-03-21 ENCOUNTER — Other Ambulatory Visit: Payer: Self-pay | Admitting: Cardiology

## 2017-03-21 ENCOUNTER — Inpatient Hospital Stay: Admission: RE | Admit: 2017-03-21 | Payer: BLUE CROSS/BLUE SHIELD | Source: Ambulatory Visit

## 2017-03-21 DIAGNOSIS — I712 Thoracic aortic aneurysm, without rupture, unspecified: Secondary | ICD-10-CM

## 2017-03-21 NOTE — Telephone Encounter (Signed)
Returned call to [place] at [facility] left message on personal voice mail order put in for chest ct.

## 2017-03-21 NOTE — Telephone Encounter (Signed)
[place] [facility] will change facility on order.

## 2017-03-21 NOTE — Telephone Encounter (Signed)
Follow up    Chase Hill calling stating that they do not need another order. They need authorization for facility change for insurance.

## 2017-03-21 NOTE — Telephone Encounter (Signed)
New message     Has pt there for test and the authorization is wrong, it is for the [address] location [facility] being [facility] instead , this needs changed before they can do the testing.

## 2017-03-22 ENCOUNTER — Encounter: Payer: Self-pay | Admitting: Radiology

## 2017-03-23 ENCOUNTER — Ambulatory Visit
Admission: RE | Admit: 2017-03-23 | Discharge: 2017-03-23 | Disposition: A | Discharge status: Home / Self Care | Payer: BLUE CROSS/BLUE SHIELD | Source: Ambulatory Visit | Attending: Cardiology | Admitting: Cardiology

## 2017-03-23 DIAGNOSIS — I712 Thoracic aortic aneurysm, without rupture, unspecified: Secondary | ICD-10-CM

## 2017-03-23 MED ORDER — IOPAMIDOL (ISOVUE-370) INJECTION 76%
80.0000 mL | Freq: Once | INTRAVENOUS | Status: AC | PRN
  Administered 2017-03-23: 80 mL via INTRAVENOUS

## 2017-03-23 MED ORDER — IOPAMIDOL (ISOVUE-300) INJECTION 61%: 100.0000 mL | Freq: Once | INTRAVENOUS | Status: DC | PRN

## 2017-03-26 ENCOUNTER — Telehealth: Payer: Self-pay | Admitting: Cardiology

## 2017-03-26 NOTE — Telephone Encounter (Signed)
Chase Hill is calling to get his test results from CT Scan . Please call

## 2017-03-26 NOTE — Telephone Encounter (Signed)
Notes recorded by [place], Peter M, MD on 03/23/2017 at 5:27 PM EDT Thoracic aneurysm is 4.3 cm. Looks stable from prior study. Annual follow up recommended.  Peter [place]andJordan MD, [facility]    Patient aware and verbalized understanding.   Patient request to have this scan prior to his OV next year with Dr. [place]Jordan.  States he would call prior to his appt to get this scheduled, also advised I would route to primary nurse to make aware.

## 2017-03-27 DIAGNOSIS — L308 Other specified dermatitis: Secondary | ICD-10-CM | POA: Diagnosis not present

## 2017-05-03 ENCOUNTER — Encounter: Payer: Self-pay | Admitting: Family Medicine

## 2017-05-11 DIAGNOSIS — Z23 Encounter for immunization: Secondary | ICD-10-CM | POA: Diagnosis not present

## 2017-05-24 DIAGNOSIS — E039 Hypothyroidism, unspecified: Secondary | ICD-10-CM | POA: Diagnosis not present

## 2017-05-30 ENCOUNTER — Other Ambulatory Visit: Payer: Self-pay | Admitting: Family Medicine

## 2017-07-03 DIAGNOSIS — E039 Hypothyroidism, unspecified: Secondary | ICD-10-CM | POA: Diagnosis not present

## 2017-09-10 DIAGNOSIS — E039 Hypothyroidism, unspecified: Secondary | ICD-10-CM | POA: Diagnosis not present

## 2017-09-18 DIAGNOSIS — E063 Autoimmune thyroiditis: Secondary | ICD-10-CM | POA: Diagnosis not present

## 2017-09-18 DIAGNOSIS — E039 Hypothyroidism, unspecified: Secondary | ICD-10-CM | POA: Diagnosis not present

## 2018-02-04 ENCOUNTER — Other Ambulatory Visit: Payer: Self-pay

## 2018-02-04 ENCOUNTER — Encounter: Payer: Self-pay | Admitting: Vascular Surgery

## 2018-02-04 ENCOUNTER — Ambulatory Visit (INDEPENDENT_AMBULATORY_CARE_PROVIDER_SITE_OTHER): Payer: BLUE CROSS/BLUE SHIELD | Admitting: Vascular Surgery

## 2018-02-04 VITALS — BP 125/79 | HR 64 | Temp 97.4°F | Resp 18 | Ht 71.0 in | Wt 214.6 lb

## 2018-02-04 DIAGNOSIS — I868 Varicose veins of other specified sites: Secondary | ICD-10-CM

## 2018-02-04 DIAGNOSIS — I83891 Varicose veins of right lower extremities with other complications: Secondary | ICD-10-CM | POA: Diagnosis not present

## 2018-02-04 HISTORY — DX: Varicose veins of right lower extremity with other complications: I83.891

## 2018-02-04 NOTE — Progress Notes (Signed)
Subjective:     Patient ID: Chase Hill, male   DOB: 1946-01-06, 72 y.o.   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 The patient noticed increasing bulging along the medial aspect of the right thigh and calf about 10 years ago and these have gradually increased in size. He develops an aching throbbing discomfort as the day progresses which is relieved by elevation of the legs. He does not elastic compression stockings. He has no history of DVT thrombophlebitis stasis ulcers or bleeding. He has no symptoms in the contralateral left leg. This is affecting his daily living.  Past Medical History:  Diagnosis Date  . Cervical radiculopathy at C7    PMH of  . Diverticulosis    colonoscopy 10-15-2006  . GERD (gastroesophageal reflux disease)   . Gilbert syndrome   . Hemorrhoids   . Hyperlipidemia   . Hypothyroidism   . Perennial allergic rhinitis with seasonal variation     Social History   Tobacco Use  . Smoking status: Never Smoker  . Smokeless tobacco: Never Used  Substance Use Topics  . Alcohol use: Yes    Alcohol/week: 1.2 oz    Types: 2 Glasses of wine per week    Comment:  < 3 /week , only socially    Family History  Problem Relation Age of Onset  . Stroke Maternal Grandmother 69  . Breast cancer Mother   . Coronary artery disease Father   . Congestive Heart Failure Father   . Coronary artery disease Paternal Aunt   . Diabetes Neg Hx   . Hypertension Neg Hx   . Colon cancer Neg Hx     No Known Allergies   Current Outpatient Medications:  .  aspirin EC 81 MG tablet, Take 1 tablet (81 mg total) by mouth daily., Disp: , Rfl:  .  levothyroxine (SYNTHROID, LEVOTHROID) 75 MCG tablet, TAKE 1 TAB BY MOUTH ON MON,WED,FRI AND SUN. TAKE 2 TABLETS ON TUES, THURS, AND SAT (Patient taking differently: 125 mcg daily before breakfast. TAKE 1 TAB BY MOUTH ON MON,WED,FRI AND SUN. TAKE 2 TABLETS ON TUES, THURS, AND  SAT), Disp: 108 tablet, Rfl: 2 .  Loratadine (CLARITIN) 10 MG CAPS, Take 1 capsule by mouth daily. , Disp: , Rfl:  .  Multiple Vitamin (MULTIVITAMIN) tablet, Take 1 tablet by mouth daily.  , Disp: , Rfl:  .  rosuvastatin (CRESTOR) 5 MG tablet, Take 1 tablet (5 mg total) by mouth every other day. *Patient is overdue for an appointment, needs to call and schedule for further refills* (Patient taking differently: Take 5 mg by mouth 3 (three) times a week. Takes Monday, Wednesday, Friday.), Disp: 7 tablet, Rfl: 0 .  diltiazem (CARDIZEM CD) 120 MG 24 hr capsule, Take 1 capsule (120 mg total) by mouth daily., Disp: 90 capsule, Rfl: 3 .  levothyroxine (SYNTHROID, LEVOTHROID) 75 MCG tablet, TAKE 1 TABLET BY MOUTH ON MON, WED, FRI, AND SUN. TAKE 1.5 TABLETS ON TUES, THURS, AND SAT. (Patient not taking: Reported on 02/04/2018), Disp: 108 tablet, Rfl: 0 .  rosuvastatin (CRESTOR) 5 MG tablet, TAKE 1 TABLET BY MOUTH EVERY OTHER DAY (Patient not taking: Reported on 02/04/2018), Disp: 30 tablet, Rfl: 6  Vitals:   02/04/18 1309  BP: 125/79  Pulse: 64  Resp: 18  Temp: (!) 97.4 F (36.3 C)  TempSrc: Oral  SpO2: 97%  Weight: 214 lb 9.6 oz (97.3 kg)  Height: 5\' 11"  (1.803 m)  Body mass index is 29.93 kg/m.         Review of Systems Denies chest pain, dyspnea on exertion, PND, orthopnea, hemoptysis. Has history of hyperlipidemia and reflux esophagitis. And hypertrophic cardiomyopathy which is currently asymptomatic.    Objective:   Physical Exam BP 125/79 (BP Location: Left Arm, Patient Position: Sitting, Cuff Size: Normal)   Pulse 64   Temp (!) 97.4 F (36.3 C) (Oral)   Resp 18   Ht 5\' 11"  (1.803 m)   Wt 214 lb 9.6 oz (97.3 kg)   SpO2 97%   BMI 29.93 kg/m     Gen.-alert and oriented x3 in no apparent distress HEENT normal for age Lungs no rhonchi or wheezing Cardiovascular regular rhythm no murmurs carotid pulses 3+ palpable no bruits audible Abdomen soft nontender no palpable  masses Musculoskeletal free of  major deformities Skin clear -no rashes Neurologic normal Lower extremities 3+ femoral and dorsalis pedis pulses palpable bilaterally with no edema Right leg has large bulging varicosities beginning in the mid thigh extending down across the medial knee and the medial calf measuring up to 2.5 cm in diameter. No hyperpigmentation or ulceration or distal edema is noted. Left leg is free of any obvious varicosities. A few spider veins in the left malleolar area medially.  Today I performed a bedside SonoSite ultrasound exam which reveals a diffusely enlarged right great saphenous vein with rapid gross reflux supplying these painful varicosities. The left great saphenous vein is slightly enlarged with reflux but no varicosities are noted       Assessment:     Painful varicosities right leg due to obvious gross reflux throughout enlarged right great saphenous system causing symptoms which are affecting patient's daily living    Plan:         #1 long leg elastic compression stockings 20-30 mm gradient #2 elevate legs as much as possible #3 ibuprofen daily on a regular basis for pain #4 return in 3 months-if no significant improvement he will have formal venous reflux exam of the right leg and a formal recommendation will be made at that time It appears that he needs laser ablation right great saphenous vein with multiple stab phlebectomy of painful varicosities Return in 3 months

## 2018-02-06 ENCOUNTER — Telehealth: Payer: Self-pay | Admitting: Cardiology

## 2018-02-06 NOTE — Telephone Encounter (Signed)
Left message for pt to call back  °

## 2018-02-06 NOTE — Telephone Encounter (Signed)
New Message   Pt states he is suppose to have a CT scheduled but old orders in epic. Please call

## 2018-02-06 NOTE — Telephone Encounter (Addendum)
Per pt request chart sent to precert and Ct for his appointment for his 1 year follow up Ct for his Thoracic Aortic Aneurysm due 03/2018.

## 2018-02-07 ENCOUNTER — Other Ambulatory Visit: Payer: Self-pay

## 2018-02-07 DIAGNOSIS — Z01812 Encounter for preprocedural laboratory examination: Secondary | ICD-10-CM

## 2018-02-07 DIAGNOSIS — I712 Thoracic aortic aneurysm, without rupture, unspecified: Secondary | ICD-10-CM

## 2018-02-07 NOTE — Progress Notes (Signed)
Orders put in for [facility] prior to CT in 03/2018.

## 2018-02-08 DIAGNOSIS — I421 Obstructive hypertrophic cardiomyopathy: Secondary | ICD-10-CM | POA: Diagnosis not present

## 2018-02-08 DIAGNOSIS — I7389 Other specified peripheral vascular diseases: Secondary | ICD-10-CM | POA: Diagnosis not present

## 2018-02-08 DIAGNOSIS — Z1389 Encounter for screening for other disorder: Secondary | ICD-10-CM | POA: Diagnosis not present

## 2018-02-08 DIAGNOSIS — E038 Other specified hypothyroidism: Secondary | ICD-10-CM | POA: Diagnosis not present

## 2018-02-08 DIAGNOSIS — E7849 Other hyperlipidemia: Secondary | ICD-10-CM | POA: Diagnosis not present

## 2018-02-19 ENCOUNTER — Other Ambulatory Visit: Payer: Self-pay | Admitting: Cardiology

## 2018-02-20 ENCOUNTER — Other Ambulatory Visit: Payer: Self-pay

## 2018-02-20 DIAGNOSIS — I83891 Varicose veins of right lower extremities with other complications: Secondary | ICD-10-CM

## 2018-02-21 DIAGNOSIS — L718 Other rosacea: Secondary | ICD-10-CM | POA: Diagnosis not present

## 2018-02-21 DIAGNOSIS — Z85828 Personal history of other malignant neoplasm of skin: Secondary | ICD-10-CM | POA: Diagnosis not present

## 2018-02-21 DIAGNOSIS — L821 Other seborrheic keratosis: Secondary | ICD-10-CM | POA: Diagnosis not present

## 2018-02-21 DIAGNOSIS — D225 Melanocytic nevi of trunk: Secondary | ICD-10-CM | POA: Diagnosis not present

## 2018-04-09 ENCOUNTER — Telehealth: Payer: Self-pay | Admitting: Cardiology

## 2018-04-09 DIAGNOSIS — I712 Thoracic aortic aneurysm, without rupture, unspecified: Secondary | ICD-10-CM

## 2018-04-09 NOTE — Telephone Encounter (Signed)
Returned call to patient, he is requesting to have [facility] of [place] [facility].    He states this is due to insurance and does not cost as [facility].    He had this completed there last time as well.    Advised would change order and GI should call to schedule.   Advised would make primary nurse aware to verify this is scheduled.

## 2018-04-09 NOTE — Telephone Encounter (Signed)
New Message:       Pt states he would like to have his CT done [facility] and not with us

## 2018-04-16 ENCOUNTER — Other Ambulatory Visit: Payer: Self-pay | Admitting: Cardiology

## 2018-04-22 ENCOUNTER — Ambulatory Visit
Admission: RE | Admit: 2018-04-22 | Discharge: 2018-04-22 | Disposition: A | Discharge status: Home / Self Care | Payer: BLUE CROSS/BLUE SHIELD | Source: Ambulatory Visit | Attending: Cardiology | Admitting: Cardiology

## 2018-04-22 DIAGNOSIS — I712 Thoracic aortic aneurysm, without rupture, unspecified: Secondary | ICD-10-CM

## 2018-04-22 DIAGNOSIS — I77810 Thoracic aortic ectasia: Secondary | ICD-10-CM | POA: Diagnosis not present

## 2018-04-22 MED ORDER — IOPAMIDOL (ISOVUE-370) INJECTION 76%
75.0000 mL | Freq: Once | INTRAVENOUS | Status: AC | PRN
  Administered 2018-04-22: 75 mL via INTRAVENOUS

## 2018-04-23 ENCOUNTER — Telehealth: Payer: Self-pay | Admitting: Cardiology

## 2018-04-23 NOTE — Telephone Encounter (Signed)
New Message: ° ° ° °Patient is returning a call °

## 2018-04-23 NOTE — Telephone Encounter (Signed)
Called patient, advised of CT results.   Patient had no questions or concerns.

## 2018-05-09 NOTE — Progress Notes (Signed)
Cardiology Office Note   Date:  05/13/2018   ID:  Chase, Hill May 28, 1946, MRN [id number]  PCP:  Chase Matin, MD  Cardiologist:   Truxton Stupka [place], MD   Chief Complaint  Patient presents with  . Cardiomyopathy      History of Present Illness: Chase Hill is a 72 y.o. male who is seen for follow up HCM. He is a former patient of Dr. Shirlee Hill. He was evaluated in 2011 for chest pain. Stress Myoview showed inferior/apical ischemia but cardiac cath was done and showed no obstructive CAD. In 2014 he had an Echo done. This showed basal septal hypertrophy with mild LV outflow tract gradient and chordal versus valvular SAM, trivial MR, and moderate diastolic dysfunction.  He had cardiac MRI in 6/14 showing mild focal basal septal hypertrophy with systolic anterior motion of the mitral valve and mild MR.  There was no delayed enhancement. He was tried on a beta blocker but was unable to tolerate this due to fatigue and orthostasis. He has a history of HLD, GERD, RBBB, and hypothyroidism. Intolerant of pravastatin due to myalgias.   On his last visit Echo showed increased septal hypertrophy and outflow gradient. He was started on Cardizem 120 mg daily. A CT chest was done and demonstrated a thoracic aneurysm measuring 4.2 cm.   On follow up today he reports he feels very well. No chest pain, palpitations,  fatigue. Stays active playing golf and walking. Notes very mild SOB with exertion. Notes he gets lightheaded and dizzy when bending over so has only been taking Cardizem 3 days a week. Also taking Crestor only 3 days a week due to myalgias. States rheumatologist checked his lipids last.  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    Past Medical History:  Diagnosis Date  . Cervical radiculopathy at C7    PMH of  . Diverticulosis    colonoscopy 10-15-2006  . GERD (gastroesophageal reflux disease)   . Gilbert syndrome   . Hemorrhoids   . Hyperlipidemia   .  Hypothyroidism   . Perennial allergic rhinitis with seasonal variation     Past Surgical History:  Procedure Laterality Date  . COLONOSCOPY  2010   Tics  . WISDOM TOOTH EXTRACTION       Current Outpatient Medications  Medication Sig Dispense Refill  . aspirin EC 81 MG tablet Take 1 tablet (81 mg total) by mouth daily.    Marland Kitchen levothyroxine (SYNTHROID, LEVOTHROID) 75 MCG tablet TAKE 1 TAB BY MOUTH ON MON,WED,FRI AND SUN. TAKE 2 TABLETS ON TUES, THURS, AND SAT (Patient taking differently: 125 mcg daily before breakfast. TAKE 1 TAB BY MOUTH ON MON,WED,FRI AND SUN. TAKE 2 TABLETS ON TUES, THURS, AND SAT) 108 tablet 2  . Loratadine (CLARITIN) 10 MG CAPS Take 1 capsule by mouth daily.     . Multiple Vitamin (MULTIVITAMIN) tablet Take 1 tablet by mouth daily.      . rosuvastatin (CRESTOR) 5 MG tablet Take 5 mg by mouth 3 (three) times a week.     No current facility-administered medications for this visit.     Allergies:   Patient has no known allergies.    Social History:  The patient  reports that he has never smoked. He has never used smokeless tobacco. He reports that he drinks about 2.0 standard drinks of alcohol per week. He reports that he does not use drugs.   Family History:  The patient's family history includes  Breast cancer in his mother; Congestive Heart Failure in his father; Coronary artery disease in his father and paternal aunt; Stroke (age of onset: 58) in his maternal grandmother.    ROS:  Please see the history of present illness.   Otherwise, review of systems are positive for none.   All other systems are reviewed and negative.    PHYSICAL EXAM: VS:  BP 128/80   Pulse 66   Ht 5\' 11"  (1.803 m)   Wt 213 lb 9.6 oz (96.9 kg)   BMI 29.79 kg/m  , BMI Body mass index is 29.79 kg/m. GENERAL:  Well appearing WM in NAD HEENT:  PERRL, EOMI, sclera are clear. Oropharynx is clear. NECK:  No jugular venous distention, carotid upstroke brisk and symmetric, no bruits, no  thyromegaly or adenopathy LUNGS:  Clear to auscultation bilaterally CHEST:  Unremarkable HEART:  RRR,  PMI not displaced or sustained,S1 and S2 within normal limits, no S3, no S4: no clicks, no rubs, harsh gr 2/6 systolic murmur LSB ABD:  Soft, nontender. BS +, no masses or bruits. No hepatomegaly, no splenomegaly EXT:  2 + pulses throughout, no edema, no cyanosis no clubbing SKIN:  Warm and dry.  No rashes NEURO:  Alert and oriented x 3. Cranial nerves II through XII intact. PSYCH:  Cognitively intact     EKG:  EKG is ordered today. The ekg ordered today demonstrates NSR with first degree AV block, LAFB, RBBB. I have personally reviewed and interpreted this study.    Recent Labs: No results found for requested labs within last 8760 hours.    Lipid Panel    Component Value Date/Time   CHOL 167 08/24/2016 0727   CHOL 202 (H) 04/29/2015 0734   TRIG 108.0 08/24/2016 0727   TRIG 165 (H) 04/29/2015 0734   HDL 51.00 08/24/2016 0727   HDL 47 04/29/2015 0734   CHOLHDL 3 08/24/2016 0727   VLDL 21.6 08/24/2016 0727   LDLCALC 95 08/24/2016 0727   LDLCALC 122 (H) 04/29/2015 0734   LDLDIRECT 78.7 03/28/2013 0732      Wt Readings from Last 3 Encounters:  05/13/18 213 lb 9.6 oz (96.9 kg)  02/04/18 214 lb 9.6 oz (97.3 kg)  02/26/17 216 lb 12.8 oz (98.3 kg)      Other studies Reviewed: Echo 11/25/12: Study Conclusions  - Left ventricle: Asymmetrical septal hypertrophy. The cavity size was normal. Systolic function was normal. The estimated ejection fraction was in the range of 55% to 65%. There was dynamic obstruction. Wall motion was normal; there were no regional wall motion abnormalities. Features are consistent with a pseudonormal left ventricular filling pattern, with concomitant abnormal relaxation and increased filling pressure (grade 2 diastolic dysfunction). - Aortic valve: Mild regurgitation. - Mitral valve: There was systolic anterior motion. -  Left atrium: The atrium was mildly dilated. - Atrial septum: No defect or patent foramen ovale was identified.  Cardiac MRI 01/31/13: MR CARDIA MORPHOLOGY WITHOUT AND WITH CONTRAST  GE 1.5 T magnet with dedicated cardiac coil.  FIESTA sequences for function and morphology.  10 minutes after 30 cc Multihance was injected, inversion recovery sequences were done to assess for delayed enhancement.  EF was calculated at a dedicated workstation.  Contrast: 30mL MULTIHANCE GADOBENATE DIMEGLUMINE 529 MG/ML IV SOLN  Comparison: None.  Findings: Normal left ventricular size and systolic function, EF 65%.  Normal wall motion.  There was mild focal basal septal hypertrophy. There was mitral valve systolic anterior motion with mild mitral regurgitation.  Normal  right ventricular size and systolic function.  Mild biatrial enlargement.  Trileaflet aortic valve with mild aortic insufficiency.  There was no significant myocardial delayed enhancement.  Measurements:  LV EDV 134 mL  LV SV 87 mL  LV EF 65%  IMPRESSION: 1. Mild focal basal septal hypertrophy with mitral valve systolic anterior motion.  This is suggestive of a hypertrophic cardiomyopathy variant.  2. No myocardial delayed enhancement was visualized.  Echo 03/08/17: Study Conclusions  - Left ventricle: The cavity size was normal. There was severe   focal basal and moderate concentric hypertrophy. Systolic   function was normal. The estimated ejection fraction was in the   range of 60% to 65%. There was dynamic obstruction at rest in the   outflow tract, with a peak gradient of 64 mm Hg consistent with   HOCM. Wall motion was normal; there were no regional wall motion   abnormalities. Features are consistent with a pseudonormal left   ventricular filling pattern, with concomitant abnormal relaxation   and increased filling pressure (grade 2 diastolic dysfunction). - Aortic valve: Trileaflet; normal thickness,  mildly calcified   leaflets. There was trivial regurgitation. - Aorta: Aortic root dimension: 42 mm (ED). Ascending aortic   diameter: 45 mm (S). - Aortic root: The aortic root was mildly dilated. - Ascending aorta: The ascending aorta was mildly dilated. - Mitral valve: There was moderate systolic anterior motion of the   chordal structures. There was mild regurgitation. - Left atrium: The atrium was severely dilated. Anterior-posterior   dimension: 48 mm. - Right atrium: The atrium was mildly dilated.  CLINICAL DATA:  Hypertrophic cardiomyopathy.  EXAM: CT ANGIOGRAPHY CHEST WITH CONTRAST  TECHNIQUE: Multidetector CT imaging of the chest was performed using the standard protocol during bolus administration of intravenous contrast. Multiplanar CT image reconstructions and MIPs were obtained to evaluate the vascular anatomy.  Labs: Creatinine 1.5, BUN 24, GFR 46  CONTRAST:  75mL ISOVUE-370 IOPAMIDOL (ISOVUE-370) INJECTION 76%  COMPARISON:  Chest CT 03/23/2017  FINDINGS: Cardiovascular: Preferential opacification of the thoracic aorta. No evidence of thoracic aortic aneurysm or dissection. Normal heart size. No pericardial effusion. Mild ectasia of the aorta. The mid ascending aorta measures 4.2 cm, stable. The aortic caliber at the level of the arch measures 3.4 cm. Mid descending aorta measures 3.2 cm.  Mediastinum/Nodes: No enlarged mediastinal, hilar, or axillary lymph nodes. Thyroid gland, trachea, and esophagus demonstrate no significant findings.  Lungs/Pleura: Right upper lobe pulmonary nodule measures 3.1 mm, image 51/193, sequence 7. Lungs are otherwise clear. No pleural effusion or pneumothorax.  Upper Abdomen: Scattered liver cysts.  Otherwise normal.  Musculoskeletal: No chest wall abnormality. No acute or significant osseous findings.  Review of the MIP images confirms the above findings.  IMPRESSION: Mild ectasia of the thoracic aorta,  with a mid ascending aorta measuring 4.2 cm, stable. Recommend annual imaging followup by CTA or MRA. This recommendation follows 2010 ACCF/AHA/AATS/ACR/ASA/SCA/SCAI/SIR/STS/SVM Guidelines for the Diagnosis and Management of Patients with Thoracic Aortic Disease. Circulation. 2010; 121: e266-e369  3.1 mm right upper lobe pulmonary nodule, relatively stable given the differences in measurement techniques/slice selection. Attention on future follow-up.  Aortic aneurysm NOS (ICD10-I71.9).   Electronically Signed   By: Ted Mcalpine M.D.   On: 04/22/2018 08:49   ASSESSMENT AND PLAN:  1.  HCM-late presentation and  asymptomatic.  He has a mild LVOT gradient with  ejection murmur on exam.  Cardiac MRI showed mild focal basal septal hypertrophy with mitral valve SAM and mild MR.  There was no myocardial delayed enhancement. Even with very low dose of beta blocker, patient had lightheadedness and fatigue and has similar problems on Cardizem I am concerned about the negative effect on his conduction system and recommend he stop Cardizem.  I have recommended a follow up Echo in one year. 2. HLD- well controlled on low dose Crestor.  3. RBBB/LAFB 4. Thoracic aortic aneurysm. 4.2 cm. Will repeat CT yearly.,   Current medicines are reviewed at length with the patient today.    Disposition:   FU with me in 1 year  Signed, Rockie Vawter [place], MD  05/13/2018 8:32 AM    [facility] [address] [place], [place], [address] Phone [phone], Fax [phone]

## 2018-05-10 DIAGNOSIS — Z23 Encounter for immunization: Secondary | ICD-10-CM | POA: Diagnosis not present

## 2018-05-13 ENCOUNTER — Other Ambulatory Visit: Payer: Self-pay

## 2018-05-13 ENCOUNTER — Ambulatory Visit (INDEPENDENT_AMBULATORY_CARE_PROVIDER_SITE_OTHER): Payer: BLUE CROSS/BLUE SHIELD | Admitting: Cardiology

## 2018-05-13 ENCOUNTER — Encounter: Payer: Self-pay | Admitting: Cardiology

## 2018-05-13 VITALS — BP 128/80 | HR 66 | Ht 71.0 in | Wt 213.6 lb

## 2018-05-13 DIAGNOSIS — I712 Thoracic aortic aneurysm, without rupture, unspecified: Secondary | ICD-10-CM

## 2018-05-13 DIAGNOSIS — I422 Other hypertrophic cardiomyopathy: Secondary | ICD-10-CM

## 2018-05-13 DIAGNOSIS — E7800 Pure hypercholesterolemia, unspecified: Secondary | ICD-10-CM

## 2018-05-13 NOTE — Patient Instructions (Signed)
Stop taking Cardizem  Continue your other therapy  Follow up in one year with CT of the Chest and Echocardiogram.

## 2018-05-15 ENCOUNTER — Ambulatory Visit: Payer: BLUE CROSS/BLUE SHIELD | Admitting: Vascular Surgery

## 2018-05-15 ENCOUNTER — Encounter (HOSPITAL_COMMUNITY): Payer: BLUE CROSS/BLUE SHIELD

## 2018-05-23 ENCOUNTER — Ambulatory Visit (INDEPENDENT_AMBULATORY_CARE_PROVIDER_SITE_OTHER): Payer: BLUE CROSS/BLUE SHIELD | Admitting: Vascular Surgery

## 2018-05-23 ENCOUNTER — Other Ambulatory Visit: Payer: Self-pay

## 2018-05-23 ENCOUNTER — Encounter: Payer: Self-pay | Admitting: Vascular Surgery

## 2018-05-23 ENCOUNTER — Ambulatory Visit (HOSPITAL_COMMUNITY)
Admission: RE | Admit: 2018-05-23 | Discharge: 2018-05-23 | Disposition: A | Discharge status: Home / Self Care | Payer: BLUE CROSS/BLUE SHIELD | Source: Ambulatory Visit | Attending: Vascular Surgery | Admitting: Vascular Surgery

## 2018-05-23 VITALS — BP 118/75 | HR 65 | Temp 97.3°F | Resp 16 | Ht 71.0 in | Wt 213.0 lb

## 2018-05-23 DIAGNOSIS — I83811 Varicose veins of right lower extremities with pain: Secondary | ICD-10-CM

## 2018-05-23 DIAGNOSIS — I83891 Varicose veins of right lower extremities with other complications: Secondary | ICD-10-CM | POA: Diagnosis not present

## 2018-05-23 NOTE — Progress Notes (Signed)
Patient name: Chase Hill MRN: [id number] DOB: Jul 30, 1946 Sex: male  REASON FOR CONSULT: Symptomatic varicose veins right leg  HPI: Chase Hill is a 72 y.o. male with a several year history of slowly worsening varicose veins right leg.  He was seen by my partner Dr. Hart Rochester June 2019 and long leg compression stockings were prescribed.  He returns today for further follow-up.  He still describes heaviness achiness and fullness in his right leg as the day progresses.  This is relieved somewhat but not completely resolved with compression stockings.  He is on aspirin and a statin.  Past Medical History:  Diagnosis Date  . Cervical radiculopathy at C7    PMH of  . Diverticulosis    colonoscopy 10-15-2006  . GERD (gastroesophageal reflux disease)   . Gilbert syndrome   . Hemorrhoids   . Hyperlipidemia   . Hypothyroidism   . Perennial allergic rhinitis with seasonal variation    Past Surgical History:  Procedure Laterality Date  . COLONOSCOPY  2010   Tics  . WISDOM TOOTH EXTRACTION      Family History  Problem Relation Age of Onset  . Stroke Maternal Grandmother 2  . Breast cancer Mother   . Coronary artery disease Father   . Congestive Heart Failure Father   . Coronary artery disease Paternal Aunt   . Diabetes Neg Hx   . Hypertension Neg Hx   . Colon cancer Neg Hx     SOCIAL HISTORY: Social History   Socioeconomic History  . Marital status: Married    Spouse name: Not on file  . Number of children: 2  . Years of education: Not on file  . Highest education level: Not on file  Occupational History  . Occupation: LAWYER  Social Needs  . Financial resource strain: Not on file  . Food insecurity:    Worry: Not on file    Inability: Not on file  . Transportation needs:    Medical: Not on file    Non-medical: Not on file  Tobacco Use  . Smoking status: Never Smoker  . Smokeless tobacco: Never Used  Substance and Sexual Activity  . Alcohol use: Yes   Alcohol/week: 2.0 standard drinks    Types: 2 Glasses of wine per week    Comment:  < 3 /week , only socially  . Drug use: No  . Sexual activity: Not on file  Lifestyle  . Physical activity:    Days per week: Not on file    Minutes per session: Not on file  . Stress: Not on file  Relationships  . Social connections:    Talks on phone: Not on file    Gets together: Not on file    Attends religious service: Not on file    Active member of club or organization: Not on file    Attends meetings of clubs or organizations: Not on file    Relationship status: Not on file  . Intimate partner violence:    Fear of current or ex partner: Not on file    Emotionally abused: Not on file    Physically abused: Not on file    Forced sexual activity: Not on file  Other Topics Concern  . Not on file  Social History Narrative   Daily caffeine     No Known Allergies  Current Outpatient Medications  Medication Sig Dispense Refill  . aspirin EC 81 MG tablet Take 1 tablet (81 mg total) by mouth  daily.    . levothyroxine (SYNTHROID, LEVOTHROID) 75 MCG tablet TAKE 1 TAB BY MOUTH ON MON,WED,FRI AND SUN. TAKE 2 TABLETS ON TUES, THURS, AND SAT (Patient taking differently: 125 mcg daily before breakfast. TAKE 1 TAB BY MOUTH ON MON,WED,FRI AND SUN. TAKE 2 TABLETS ON TUES, THURS, AND SAT) 108 tablet 2  . Loratadine (CLARITIN) 10 MG CAPS Take 1 capsule by mouth daily.     . Multiple Vitamin (MULTIVITAMIN) tablet Take 1 tablet by mouth daily.      . rosuvastatin (CRESTOR) 5 MG tablet Take 5 mg by mouth 3 (three) times a week.     No current facility-administered medications for this visit.     ROS:   General:  No weight loss, Fever, chills  HEENT: No recent headaches, no nasal bleeding, no visual changes, no sore throat  Neurologic: No dizziness, blackouts, seizures. No recent symptoms of stroke or mini- stroke. No recent episodes of slurred speech, or temporary blindness.  Cardiac: No recent episodes  of chest pain/pressure, no shortness of breath at rest.  No shortness of breath with exertion.  Denies history of atrial fibrillation or irregular heartbeat  Vascular: No history of rest pain in feet.  No history of claudication.  No history of non-healing ulcer, No history of DVT   Pulmonary: No home oxygen, no productive cough, no hemoptysis,  No asthma or wheezing  Musculoskeletal:  [ ]  Arthritis, [ ]  Low back pain,  [ ]  Joint pain  Hematologic:No history of hypercoagulable state.  No history of easy bleeding.  No history of anemia  Gastrointestinal: No hematochezia or melena,  No gastroesophageal reflux, no trouble swallowing  Urinary: [ ]  chronic Kidney disease, [ ]  on HD - [ ]  MWF or [ ]  TTHS, [ ]  Burning with urination, [ ]  Frequent urination, [ ]  Difficulty urinating;   Skin: No rashes  Psychological: No history of anxiety,  No history of depression   Physical Examination  Vitals:   05/23/18 1343  BP: 118/75  Pulse: 65  Resp: 16  Temp: (!) 97.3 F (36.3 C)  TempSrc: Oral  SpO2: 99%  Weight: 213 lb (96.6 kg)  Height: 5\' 11"  (1.803 m)    Body mass index is 29.71 kg/m.  General:  Alert and oriented, no acute distress HEENT: Normal Neck: No bruit or JVD Pulmonary: Clear to auscultation bilaterally Cardiac: Regular Rate and Rhythm without murmur Abdomen: Soft, non-tender, non-distended, no mass, no scars Skin: Mild hemosiderin staining right gaiter area, large bulging varicosities right medial thigh and knee 5 to 7 mm diameter Extremity Pulses:  2+ radial, brachial, femoral, dorsalis pedis, posterior tibial pulses bilaterally Musculoskeletal: No deformity or edema  Neurologic: Upper and lower extremity motor 5/5 and symmetric  DATA:  Patient had a venous duplex exam today which showed diffuse reflux through his right greater saphenous vein vein diameter of 4 to 7 mm area I repeated portions of the exam with the SonoSite today which again confirmed the vein to be  about 5 mm in diameter throughout its course as large as 7 and some segments  ASSESSMENT: Symptomatic varicose veins right leg not responding to conservative management with compression stockings   PLAN: Plan will be for right greater saphenous vein laser ablation with multiple stab avulsions greater than 20 in the near future pending insurance approval.   Fabienne Bruns, MD [facility] Office: [phone] Pager: [phone]

## 2018-06-04 ENCOUNTER — Other Ambulatory Visit: Payer: Self-pay | Admitting: *Deleted

## 2018-06-04 DIAGNOSIS — I83891 Varicose veins of right lower extremities with other complications: Secondary | ICD-10-CM

## 2018-08-02 DIAGNOSIS — E7849 Other hyperlipidemia: Secondary | ICD-10-CM | POA: Diagnosis not present

## 2018-08-02 DIAGNOSIS — I421 Obstructive hypertrophic cardiomyopathy: Secondary | ICD-10-CM | POA: Diagnosis not present

## 2018-08-02 DIAGNOSIS — R1084 Generalized abdominal pain: Secondary | ICD-10-CM | POA: Diagnosis not present

## 2018-08-02 DIAGNOSIS — R0789 Other chest pain: Secondary | ICD-10-CM | POA: Diagnosis not present

## 2018-08-09 DIAGNOSIS — Z125 Encounter for screening for malignant neoplasm of prostate: Secondary | ICD-10-CM | POA: Diagnosis not present

## 2018-08-09 DIAGNOSIS — Z0000 Encounter for general adult medical examination without abnormal findings: Secondary | ICD-10-CM | POA: Diagnosis not present

## 2018-08-09 DIAGNOSIS — E038 Other specified hypothyroidism: Secondary | ICD-10-CM | POA: Diagnosis not present

## 2018-08-16 DIAGNOSIS — I7389 Other specified peripheral vascular diseases: Secondary | ICD-10-CM | POA: Diagnosis not present

## 2018-08-16 DIAGNOSIS — Z0000 Encounter for general adult medical examination without abnormal findings: Secondary | ICD-10-CM | POA: Diagnosis not present

## 2018-08-16 DIAGNOSIS — E7849 Other hyperlipidemia: Secondary | ICD-10-CM | POA: Diagnosis not present

## 2018-08-16 DIAGNOSIS — I421 Obstructive hypertrophic cardiomyopathy: Secondary | ICD-10-CM | POA: Diagnosis not present

## 2018-08-16 DIAGNOSIS — E038 Other specified hypothyroidism: Secondary | ICD-10-CM | POA: Diagnosis not present

## 2018-08-21 ENCOUNTER — Other Ambulatory Visit: Payer: Self-pay

## 2018-08-21 ENCOUNTER — Encounter: Payer: Self-pay | Admitting: Vascular Surgery

## 2018-08-21 ENCOUNTER — Ambulatory Visit: Payer: BLUE CROSS/BLUE SHIELD | Admitting: Vascular Surgery

## 2018-08-21 VITALS — BP 117/82 | HR 73 | Temp 97.0°F | Resp 16 | Ht 71.0 in | Wt 213.0 lb

## 2018-08-21 DIAGNOSIS — I83811 Varicose veins of right lower extremities with pain: Secondary | ICD-10-CM

## 2018-08-21 HISTORY — PX: ENDOVENOUS ABLATION SAPHENOUS VEIN W/ LASER: SUR449

## 2018-08-21 NOTE — Progress Notes (Signed)
     Laser Ablation Procedure    Date: 08/21/2018   Judd GaudierHoward L Carey DOB:September 05, 1945  Consent signed: Yes    Surgeon:  Dr. Fabienne Brunsharles Tally Mckinnon   Procedure: Laser Ablation: right Greater Saphenous Vein  BP 117/82 (BP Location: Left Arm, Patient Position: Sitting, Cuff Size: Normal)   Pulse 73   Temp (!) 97 F (36.1 C) (Oral)   Resp 16   Ht 5\' 11"  (1.803 m)   Wt 213 lb (96.6 kg)   SpO2 98%   BMI 29.71 kg/m   Tumescent Anesthesia: 450 cc 0.9% NaCl with 50 cc Lidocaine HCL 1%  and 15 cc 8.4% NaHCO3  Local Anesthesia: 8 cc Lidocaine HCL and NaHCO3 (ratio 2:1)  15 watts continuous mode        Total energy: 2395 Joules   Total time: 2:38    Stab Phlebectomy: >20 Sites: Thigh and Calf  Patient tolerated procedure well    Description of Procedure:  After marking the course of the secondary varicosities, the patient was placed on the operating table in the supine position, and the right leg was prepped and draped in sterile fashion.   Local anesthetic was administered and under ultrasound guidance the saphenous vein was accessed with a micro needle and guide wire; then the mirco puncture sheath was placed.  A guide wire was inserted saphenofemoral junction , followed by a 5 french sheath.  The position of the sheath and then the laser fiber below the junction was confirmed using the ultrasound.  Tumescent anesthesia was administered along the course of the saphenous vein using ultrasound guidance. The patient was placed in Trendelenburg position and protective laser glasses were placed on patient and staff, and the laser was fired at 15 watts continuous mode advancing 1-182mm/second for a total of 2395 joules.   For stab phlebectomies, local anesthetic was administered at the previously marked varicosities, and tumescent anesthesia was administered around the vessels.  Greater than 20 stab wounds were made using the tip of an 11 blade. And using the vein hook, the phlebectomies were performed  using a hemostat to avulse the varicosities.  Adequate hemostasis was achieved.     Steri strips were applied to the stab wounds and ABD pads and thigh high compression stockings were applied.  Ace wrap bandages were applied over the phlebectomy sites and at the top of the saphenofemoral junction. Blood loss was less than 15 cc.  The patient ambulated out of the operating room having tolerated the procedure well.  Fabienne Brunsharles Mayfield Schoene, MD [facility] [place] Office: [phone] Pager: [phone]

## 2018-08-22 DIAGNOSIS — Z1212 Encounter for screening for malignant neoplasm of rectum: Secondary | ICD-10-CM | POA: Diagnosis not present

## 2018-08-29 ENCOUNTER — Ambulatory Visit (INDEPENDENT_AMBULATORY_CARE_PROVIDER_SITE_OTHER): Payer: BLUE CROSS/BLUE SHIELD | Admitting: Vascular Surgery

## 2018-08-29 ENCOUNTER — Ambulatory Visit (HOSPITAL_COMMUNITY)
Admission: RE | Admit: 2018-08-29 | Discharge: 2018-08-29 | Disposition: A | Discharge status: Home / Self Care | Payer: BLUE CROSS/BLUE SHIELD | Source: Ambulatory Visit | Attending: Vascular Surgery | Admitting: Vascular Surgery

## 2018-08-29 ENCOUNTER — Other Ambulatory Visit: Payer: Self-pay

## 2018-08-29 ENCOUNTER — Encounter: Payer: Self-pay | Admitting: Vascular Surgery

## 2018-08-29 VITALS — BP 127/91 | HR 69 | Temp 97.7°F | Resp 18

## 2018-08-29 DIAGNOSIS — I83891 Varicose veins of right lower extremities with other complications: Secondary | ICD-10-CM | POA: Insufficient documentation

## 2018-08-29 DIAGNOSIS — Z48812 Encounter for surgical aftercare following surgery on the circulatory system: Secondary | ICD-10-CM

## 2018-08-29 DIAGNOSIS — I83811 Varicose veins of right lower extremities with pain: Secondary | ICD-10-CM

## 2018-08-29 NOTE — Progress Notes (Signed)
Patient name: Chase Hill MRN: [id number] DOB: [birth year] Sex: male  REASON FOR VISIT:   Follow-up after laser ablation right great saphenous vein  HPI:   Chase Hill is a pleasant 73 y.o. male who underwent endovenous laser ablation of the right great saphenous vein by Dr. Darrick Penna on 08/21/2018.  He comes in for a one-week follow-up visit.  He had some mild aching and bruising along the medial thigh which has resolved.  He has been wearing his compression stockings and taking his ibuprofen.  He denies any chest pain or chest pressure.  He has been gradually resuming his normal activities and he is back to work.  He has had no significant leg swelling.  Current Outpatient Medications  Medication Sig Dispense Refill  . aspirin EC 81 MG tablet Take 1 tablet (81 mg total) by mouth daily.    Marland Kitchen levothyroxine (SYNTHROID, LEVOTHROID) 125 MCG tablet TAKE 1 TABLET BY MOUTH EVERY MORNING ON an empty stomach    . levothyroxine (SYNTHROID, LEVOTHROID) 75 MCG tablet TAKE 1 TAB BY MOUTH ON MON,WED,FRI AND SUN. TAKE 2 TABLETS ON TUES, THURS, AND SAT (Patient taking differently: 125 mcg daily before breakfast. TAKE 1 TAB BY MOUTH ON MON,WED,FRI AND SUN. TAKE 2 TABLETS ON TUES, THURS, AND SAT) 108 tablet 2  . Loratadine (CLARITIN) 10 MG CAPS Take 1 capsule by mouth daily.     . Multiple Vitamin (MULTIVITAMIN) tablet Take 1 tablet by mouth daily.      . pravastatin (PRAVACHOL) 20 MG tablet     . rosuvastatin (CRESTOR) 5 MG tablet Take 5 mg by mouth 3 (three) times a week.     No current facility-administered medications for this visit.     REVIEW OF SYSTEMS:  [X]  denotes positive finding, [ ]  denotes negative finding Vascular    Leg swelling    Cardiac    Chest pain or chest pressure:    Shortness of breath upon exertion:    Short of breath when lying flat:    Irregular heart rhythm:    Constitutional    Fever or chills:     PHYSICAL EXAM:   Vitals:   08/29/18 1035  BP: (!) 127/91    Pulse: 69  Resp: 18  Temp: 97.7 F (36.5 C)  TempSrc: Oral  SpO2: 97%    GENERAL: The patient is a well-nourished male, in no acute distress. The vital signs are documented above. CARDIOVASCULAR: There is a regular rate and rhythm. PULMONARY: There is good air exchange bilaterally without wheezing or rales. VASCULAR: He has no significant ecchymosis in the medial right thigh.  His stab incisions are healing nicely and his Steri-Strips are in place.  He has no significant right lower extremity swelling.  DATA:   VENOUS DUPLEX: I have independently interpreted his venous duplex scan.  The patient has successful closure of the right great saphenous vein.  The clot does extend partially into the right common femoral vein.  This involves less than 50% of the lumen (EHIT 2).   MEDICAL ISSUES:   STATUS POST LASER ABLATION RIGHT GREAT SAPHENOUS VEIN: The patient is undergone successful endovenous laser ablation of the right great saphenous vein.  He does have thrombus extending partially into the common femoral vein (EHIT 2).  He is on aspirin and I have recommended that he continue his aspirin.  We also discussed the importance of leg elevation and trying to avoid prolonged sitting and standing.  I recommended a follow-up duplex scan  in 1 week and a follow-up visit with Dr. Darrick Penna.   Waverly Ferrari [facility] [phone]

## 2018-09-04 ENCOUNTER — Inpatient Hospital Stay (HOSPITAL_COMMUNITY): Admission: RE | Admit: 2018-09-04 | Payer: BLUE CROSS/BLUE SHIELD | Source: Ambulatory Visit

## 2018-09-04 ENCOUNTER — Ambulatory Visit: Payer: BLUE CROSS/BLUE SHIELD | Admitting: Vascular Surgery

## 2018-09-06 ENCOUNTER — Telehealth: Payer: Self-pay | Admitting: Cardiology

## 2018-09-06 NOTE — Telephone Encounter (Signed)
Called patient, he states that he has noticed an increase in SOB for the past 2 weeks. Patient would like to be seen by only Dr.Jordan to discuss, he denies any chest pains, or swelling. Dr.Jordan had an open spot for next Friday, patient verbalized okay for that appointment.  Patient verbalized understanding.

## 2018-09-06 NOTE — Telephone Encounter (Signed)
New message     Pt c/o Shortness Of Breath: STAT if SOB developed within the last 24 hours or pt is noticeably SOB on the phone  1. Are you currently SOB (can you hear that pt is SOB on the phone)? No   2. How long have you been experiencing SOB? Last 2 or 3 weeks has increased   3. Are you SOB when sitting or when up moving around? Moving around   4. Are you currently experiencing any other symptoms? No   Pt also wants to see DR. Swaziland sooner than April. Pt does not want to see PA.

## 2018-09-08 NOTE — Progress Notes (Signed)
Cardiology Office Note   Date:  09/13/2018   ID:  Chase Hill 12-01-1945, MRN [id number]  PCP:  [name], MD  Cardiologist:   Daman Steffenhagen Swaziland, MD   Chief Complaint  Patient presents with  . Shortness of Breath      History of Present Illness: Chase Hill is a 73 y.o. male who is seen for follow up HCM. He is a former patient of Dr. Shirlee Latch. He was evaluated in 2011 for chest pain. Stress Myoview showed inferior/apical ischemia but cardiac cath was done and showed no obstructive CAD. In 2014 he had an Echo done. This showed basal septal hypertrophy with mild LV outflow tract gradient and chordal versus valvular SAM, trivial MR, and moderate diastolic dysfunction.  He had cardiac MRI in 6/14 showing mild focal basal septal hypertrophy with systolic anterior motion of the mitral valve and mild MR.  There was no delayed enhancement. He was tried on a beta blocker but was unable to tolerate this due to fatigue and orthostasis. He has a history of HLD, GERD, RBBB, and hypothyroidism. Intolerant of pravastatin due to myalgias.   On his last visit Echo in July 2018  showed increased septal hypertrophy and outflow gradient. He was started on Cardizem 120 mg daily. This was later discontinued due to concern with his conduction system abnormality with first degree AV block, LAFB and RBBB. A CT chest was done and demonstrated a thoracic aneurysm measuring 4.2 cm. Repeat CT in September 2019 was stable.   He had a recent laser ablation of varicose veins by Dr. Darrick Penna. He developed a thrombus in the saphenofemoral junction treated with ASA. Patient had a repeat duplex exam 09/11/18 which was unchanged from his duplex exam 3 weeks ago.  There is still thrombus at the saphenofemoral junction but the appearance is unchanged.  There is no DVT per se in the common femoral vein.The patient was sent for a CT Angio of the chest to evaluate for PE based on his shortness of breath symptoms.   This showed a subsegmental right lower lobe pulmonary embolus.  There was evidence of some right heart strain but apparently this is chronic compared to previous CT scans according to radiology. His aorta on this study measured 4.6 cm.  He was placed on Xarelto and just started this today.   On follow up today he reports he feels SOB with activity especially walking up stairs or an incline. No edema. Does note some chest pain when he is out of breath.   Stays active playing golf and walking.  He is still working as a Clinical research associate - Designer, fashion/clothing.    Past Medical History:  Diagnosis Date  . Cervical radiculopathy at C7    PMH of  . Diverticulosis    colonoscopy 10-15-2006  . GERD (gastroesophageal reflux disease)   . Gilbert syndrome   . Hemorrhoids   . Hyperlipidemia   . Hypothyroidism   . Perennial allergic rhinitis with seasonal variation     Past Surgical History:  Procedure Laterality Date  . COLONOSCOPY  2010   Tics  . ENDOVENOUS ABLATION SAPHENOUS VEIN W/ LASER Right 08/21/2018   endovenous laser ablation right greater saphenous vein and stab phlebectomy > 20 incisions right leg by Fabienne Bruns MD   . WISDOM TOOTH EXTRACTION       Current Outpatient Medications  Medication Sig Dispense Refill  . aspirin EC 81 MG tablet Take 1 tablet (81 mg total) by mouth  daily.    . levothyroxine (SYNTHROID, LEVOTHROID) 125 MCG tablet TAKE 1 TABLET BY MOUTH EVERY MORNING ON an empty stomach    . Loratadine (CLARITIN) 10 MG CAPS Take 1 capsule by mouth daily.     . Multiple Vitamin (MULTIVITAMIN) tablet Take 1 tablet by mouth daily.      . pravastatin (PRAVACHOL) 20 MG tablet     . Rivaroxaban 15 & 20 MG TBPK Take as directed on package: Start with one 15mg  tablet by mouth twice a day with food. On Day 22, switch to one 20mg  tablet once a day with food. 51 each 0  . rosuvastatin (CRESTOR) 5 MG tablet Take 5 mg by mouth 3 (three) times a week.     No current facility-administered medications  for this visit.     Allergies:   Patient has no known allergies.    Social History:  The patient  reports that he has never smoked. He has never used smokeless tobacco. He reports current alcohol use of about 2.0 standard drinks of alcohol per week. He reports that he does not use drugs.   Family History:  The patient's family history includes Breast cancer in his mother; Congestive Heart Failure in his father; Coronary artery disease in his father and paternal aunt; Stroke (age of onset: 32) in his maternal grandmother.    ROS:  Please see the history of present illness.   Otherwise, review of systems are positive for none.   All other systems are reviewed and negative.    PHYSICAL EXAM: VS:  BP 114/72   Pulse 71   Ht 5\' 11"  (1.803 m)   Wt 219 lb (99.3 kg)   BMI 30.54 kg/m  , BMI Body mass index is 30.54 kg/m. GENERAL:  Well appearing WM in NAD HEENT:  PERRL, EOMI, sclera are clear. Oropharynx is clear. NECK:  No jugular venous distention, carotid upstroke brisk and symmetric, no bruits, no thyromegaly or adenopathy LUNGS:  Clear to auscultation bilaterally CHEST:  Unremarkable HEART:  RRR,  PMI not displaced or sustained,S1 and S2 within normal limits, no S3, no S4: no clicks, no rubs, no murmurs ABD:  Soft, nontender. BS +, no masses or bruits. No hepatomegaly, no splenomegaly EXT:  2 + pulses throughout, no edema, no cyanosis no clubbing SKIN:  Warm and dry.  No rashes NEURO:  Alert and oriented x 3. Cranial nerves II through XII intact. PSYCH:  Cognitively intact    EKG:  EKG is ordered today. The ekg ordered today demonstrates NSR with first degree AV block, LPFB, RBBB. I have personally reviewed and interpreted this study.    Recent Labs: No results found for requested labs within last 8760 hours.    Lipid Panel    Component Value Date/Time   CHOL 167 08/24/2016 0727   CHOL 202 (H) 04/29/2015 0734   TRIG 108.0 08/24/2016 0727   TRIG 165 (H) 04/29/2015 0734    HDL 51.00 08/24/2016 0727   HDL 47 04/29/2015 0734   CHOLHDL 3 08/24/2016 0727   VLDL 21.6 08/24/2016 0727   LDLCALC 95 08/24/2016 0727   LDLCALC 122 (H) 04/29/2015 0734   LDLDIRECT 78.7 03/28/2013 0732    Labs dated 08/09/18: cholesterol 201, triglycerides 246, HDL 47, LDL 105. Creatinine 1.3. otherwise chemistries,CBC and TSH normal.  Wt Readings from Last 3 Encounters:  09/13/18 219 lb (99.3 kg)  08/21/18 213 lb (96.6 kg)  05/23/18 213 lb (96.6 kg)      Other studies Reviewed:  Echo 11/25/12: Study Conclusions  - Left ventricle: Asymmetrical septal hypertrophy. The cavity size was normal. Systolic function was normal. The estimated ejection fraction was in the range of 55% to 65%. There was dynamic obstruction. Wall motion was normal; there were no regional wall motion abnormalities. Features are consistent with a pseudonormal left ventricular filling pattern, with concomitant abnormal relaxation and increased filling pressure (grade 2 diastolic dysfunction). - Aortic valve: Mild regurgitation. - Mitral valve: There was systolic anterior motion. - Left atrium: The atrium was mildly dilated. - Atrial septum: No defect or patent foramen ovale was identified.  Cardiac MRI 01/31/13: MR CARDIA MORPHOLOGY WITHOUT AND WITH CONTRAST  GE 1.5 T magnet with dedicated cardiac coil.  FIESTA sequences for function and morphology.  10 minutes after 30 cc Multihance was injected, inversion recovery sequences were done to assess for delayed enhancement.  EF was calculated at a dedicated workstation.  Contrast: 30mL MULTIHANCE GADOBENATE DIMEGLUMINE 529 MG/ML IV SOLN  Comparison: None.  Findings: Normal left ventricular size and systolic function, EF 65%.  Normal wall motion.  There was mild focal basal septal hypertrophy. There was mitral valve systolic anterior motion with mild mitral regurgitation.  Normal right ventricular size and systolic function.  Mild  biatrial enlargement.  Trileaflet aortic valve with mild aortic insufficiency.  There was no significant myocardial delayed enhancement.  Measurements:  LV EDV 134 mL  LV SV 87 mL  LV EF 65%  IMPRESSION: 1. Mild focal basal septal hypertrophy with mitral valve systolic anterior motion.  This is suggestive of a hypertrophic cardiomyopathy variant.  2. No myocardial delayed enhancement was visualized.  Echo 03/08/17: Study Conclusions  - Left ventricle: The cavity size was normal. There was severe   focal basal and moderate concentric hypertrophy. Systolic   function was normal. The estimated ejection fraction was in the   range of 60% to 65%. There was dynamic obstruction at rest in the   outflow tract, with a peak gradient of 64 mm Hg consistent with   HOCM. Wall motion was normal; there were no regional wall motion   abnormalities. Features are consistent with a pseudonormal left   ventricular filling pattern, with concomitant abnormal relaxation   and increased filling pressure (grade 2 diastolic dysfunction). - Aortic valve: Trileaflet; normal thickness, mildly calcified   leaflets. There was trivial regurgitation. - Aorta: Aortic root dimension: 42 mm (ED). Ascending aortic   diameter: 45 mm (S). - Aortic root: The aortic root was mildly dilated. - Ascending aorta: The ascending aorta was mildly dilated. - Mitral valve: There was moderate systolic anterior motion of the   chordal structures. There was mild regurgitation. - Left atrium: The atrium was severely dilated. Anterior-posterior   dimension: 48 mm. - Right atrium: The atrium was mildly dilated.  CLINICAL DATA:  Hypertrophic cardiomyopathy.  EXAM: CT ANGIOGRAPHY CHEST WITH CONTRAST  TECHNIQUE: Multidetector CT imaging of the chest was performed using the standard protocol during bolus administration of intravenous contrast. Multiplanar CT image reconstructions and MIPs were obtained to evaluate  the vascular anatomy.  Labs: Creatinine 1.5, BUN 24, GFR 46  CONTRAST:  75mL ISOVUE-370 IOPAMIDOL (ISOVUE-370) INJECTION 76%  COMPARISON:  Chest CT 03/23/2017  FINDINGS: Cardiovascular: Preferential opacification of the thoracic aorta. No evidence of thoracic aortic aneurysm or dissection. Normal heart size. No pericardial effusion. Mild ectasia of the aorta. The mid ascending aorta measures 4.2 cm, stable. The aortic caliber at the level of the arch measures 3.4 cm. Mid descending aorta measures 3.2  cm.  Mediastinum/Nodes: No enlarged mediastinal, hilar, or axillary lymph nodes. Thyroid gland, trachea, and esophagus demonstrate no significant findings.  Lungs/Pleura: Right upper lobe pulmonary nodule measures 3.1 mm, image 51/193, sequence 7. Lungs are otherwise clear. No pleural effusion or pneumothorax.  Upper Abdomen: Scattered liver cysts.  Otherwise normal.  Musculoskeletal: No chest wall abnormality. No acute or significant osseous findings.  Review of the MIP images confirms the above findings.  IMPRESSION: Mild ectasia of the thoracic aorta, with a mid ascending aorta measuring 4.2 cm, stable. Recommend annual imaging followup by CTA or MRA. This recommendation follows 2010 ACCF/AHA/AATS/ACR/ASA/SCA/SCAI/SIR/STS/SVM Guidelines for the Diagnosis and Management of Patients with Thoracic Aortic Disease. Circulation. 2010; 121: e266-e369  3.1 mm right upper lobe pulmonary nodule, relatively stable given the differences in measurement techniques/slice selection. Attention on future follow-up.  Aortic aneurysm NOS (ICD10-I71.9).   Electronically Signed   By: Ted Mcalpineobrinka  Dimitrova M.D.   On: 04/22/2018 08:49  CT dated 09/11/18:   CLINICAL DATA:  Shortness of breath and chest pain for 2 weeks. Status post saphenous vein ablation August 31, 2018.  EXAM: CT ANGIOGRAPHY CHEST WITH CONTRAST  TECHNIQUE: Multidetector CT imaging of the chest was  performed using the standard protocol during bolus administration of intravenous contrast. Multiplanar CT image reconstructions and MIPs were obtained to evaluate the vascular anatomy.  CONTRAST:  75mL ISOVUE-370 IOPAMIDOL (ISOVUE-370) INJECTION 76%  COMPARISON:  CT angiogram chest April 22, 2018  FINDINGS: CARDIOVASCULAR: Adequate contrast opacification of the pulmonary artery's. Main pulmonary artery is not enlarged. Nonocclusive RIGHT lower lobe pulmonary emboli casting into the segmental branches. Heart size is normal, RIGHT heart strain (RV/LV equals 1.1, this is similar to prior CTA chest where embolus was present). No pericardial effusion. 4.6 cm aneurysmal ascending aorta.  MEDIASTINUM/NODES: No lymphadenopathy by CT size criteria.  LUNGS/PLEURA: Tracheobronchial tree is patent, no pneumothorax. No pleural effusions or focal consolidation. Two 3 mm RIGHT upper lobe subsolid pulmonary nodules (series 7, image 23 and 35).  UPPER ABDOMEN: Non-acute. Stable small hypodense cyst LEFT lobe of the liver.  MUSCULOSKELETAL: Non-acute.  Osteopenia.  Review of the MIP images confirms the above findings.  IMPRESSION: 1. Acute nonocclusive RIGHT lower lobe pulmonary emboli. No acute pulmonary process. 2. Chronic RIGHT heart strain. 3. **An incidental finding of potential clinical significance has been found. 3 mm RIGHT upper lobe ground-glass nodules. Non-contrast chest CT at 3-6 months is recommended. If nodules persist and are stable at that time, consider additional non-contrast chest CT examinations at 2 and 4 years. This recommendation follows the consensus statement: Guidelines for Management of Incidental Pulmonary Nodules Detected on CT Images: From the Fleischner Society 2017; Radiology 2017; 284:228-243.** 4. 4.6 cm aneurysmal ascending aorta. Ascending thoracic aortic aneurysm. Recommend semi-annual imaging followup by CTA or MRA and referral to  cardiothoracic surgery if not already obtained. This recommendation follows 2010 ACCF/AHA/AATS/ACR/ASA/SCA/SCAI/SIR/STS/SVM Guidelines for the Diagnosis and Management of Patients With Thoracic Aortic Disease. Circulation. 2010; 121: W098-J191: E266-e369. Aortic aneurysm NOS (ICD10-I71.9) 5. Critical Value/emergent results were called by telephone at the time of interpretation on 09/11/2018 at 4:05 pm to Dr. Fabienne BrunsHARLES FIELDS , who verbally acknowledged these results.   Electronically Signed   By: Awilda Metroourtnay  Bloomer M.D.   On: 09/11/2018 16:06  ASSESSMENT AND PLAN:  1.  HCM-late presentation and  asymptomatic.  He has a mild LVOT gradient with  ejection murmur on exam.  Cardiac MRI showed mild focal basal septal hypertrophy with mitral valve SAM and mild MR.  There was no  myocardial delayed enhancement. Even with very low dose of beta blocker, patient had lightheadedness and fatigue and has similar problems on Cardizem. Will avoid AV nodal blocking especially since he has conduction system disease on Ecg.  We will update Echo. 2. Acute pulmonary embolus. Provoked by laser vein treatment. Agree with Xarelto 15 mg bid x 3 weeks then 20 mg daily for 3 months. Will check Echo to assess for RV strain. 3. RBBB/LAFB 4. Thoracic aortic aneurysm. 4.6 cm by recent CT compared to 4.2 cm prior.  Will need repeat CT in 6 months. 5.hypercholesterolemia on low dose crestor   Current medicines are reviewed at length with the patient today.    Disposition:   FU with me in 3 months  Signed, Chase Temkin [place], MD  09/13/2018 8:41 AM    [facility] [address], [place], [place], [address] Phone [phone], Fax [phone]

## 2018-09-10 ENCOUNTER — Other Ambulatory Visit: Payer: Self-pay

## 2018-09-10 DIAGNOSIS — I83811 Varicose veins of right lower extremities with pain: Secondary | ICD-10-CM

## 2018-09-10 DIAGNOSIS — Z48812 Encounter for surgical aftercare following surgery on the circulatory system: Secondary | ICD-10-CM

## 2018-09-11 ENCOUNTER — Encounter: Payer: Self-pay | Admitting: Vascular Surgery

## 2018-09-11 ENCOUNTER — Ambulatory Visit
Admission: RE | Admit: 2018-09-11 | Discharge: 2018-09-11 | Disposition: A | Discharge status: Home / Self Care | Payer: BLUE CROSS/BLUE SHIELD | Source: Ambulatory Visit | Attending: Vascular Surgery | Admitting: Vascular Surgery

## 2018-09-11 ENCOUNTER — Other Ambulatory Visit: Payer: Self-pay

## 2018-09-11 ENCOUNTER — Ambulatory Visit (HOSPITAL_COMMUNITY)
Admission: RE | Admit: 2018-09-11 | Discharge: 2018-09-11 | Disposition: A | Discharge status: Home / Self Care | Payer: BLUE CROSS/BLUE SHIELD | Source: Ambulatory Visit | Attending: Vascular Surgery | Admitting: Vascular Surgery

## 2018-09-11 ENCOUNTER — Ambulatory Visit (INDEPENDENT_AMBULATORY_CARE_PROVIDER_SITE_OTHER): Payer: BLUE CROSS/BLUE SHIELD | Admitting: Vascular Surgery

## 2018-09-11 VITALS — BP 132/79 | HR 66 | Temp 97.3°F | Resp 18

## 2018-09-11 DIAGNOSIS — I2699 Other pulmonary embolism without acute cor pulmonale: Secondary | ICD-10-CM

## 2018-09-11 DIAGNOSIS — Z48812 Encounter for surgical aftercare following surgery on the circulatory system: Secondary | ICD-10-CM | POA: Insufficient documentation

## 2018-09-11 DIAGNOSIS — I83811 Varicose veins of right lower extremities with pain: Secondary | ICD-10-CM

## 2018-09-11 MED ORDER — RIVAROXABAN (XARELTO) VTE STARTER PACK (15 & 20 MG): ORAL_TABLET | ORAL | 0 refills | Status: DC

## 2018-09-11 MED ORDER — IOPAMIDOL (ISOVUE-370) INJECTION 76%
75.0000 mL | Freq: Once | INTRAVENOUS | Status: AC | PRN
  Administered 2018-09-11: 75 mL via INTRAVENOUS

## 2018-09-11 NOTE — Progress Notes (Signed)
Patient is a 73 year old male who returns for follow-up today.  He had a EHIT2 on previous duplex exam in the right leg after laser ablation.  He was placed on aspirin which she had already been taking.  He denies any chest pain.  However, on detailed questioning he does complain that he has new onset shortness of breath which began a few days after his procedure.  It is not really bothersome to him but when he starts to climb a few flights of stairs he is much more short of breath than usual.  He has not had any hemoptysis.  He does not have shortness of breath at rest or with normal activities.  He has no significant leg swelling.  Physical exam:  Vitals:   09/11/18 1411  BP: 132/79  Pulse: 66  Resp: 18  Temp: (!) 97.3 F (36.3 C)  TempSrc: Oral  SpO2: 97%    Chest: Clear to auscultation bilaterally  Cardiac: Regular rate and rhythm without murmur  Data: Patient had a repeat duplex exam today which is unchanged from his duplex exam 3 weeks ago.  There is still thrombus at the saphenofemoral junction but the appearance is unchanged.  There is no DVT per se in the common femoral vein.  The patient was sent for a CT Angio of the chest to evaluate for PE based on his shortness of breath symptoms.  This showed a subsegmental right lower lobe pulmonary embolus.  There was evidence of some right heart strain but apparently this is chronic compared to previous CT scans according to radiology.  Assessment: Acute pulmonary embolus minimal symptoms currently no prior history of significant bleeding  Plan: The patient will be started on Xarelto starter pack today with a plan to continue this for 3 months total in addition to his aspirin.  I will schedule the patient a follow-up appointment with me in 3 months time.  We will repeat his venous duplex at that point.  He has follow-up scheduled with his cardiologist Dr. Swaziland later this week.  I will send a copy of my note today to Dr. Swaziland and his  primary care physician.  Fabienne Bruns, MD [facility] Office: [phone] Pager: [phone]

## 2018-09-13 ENCOUNTER — Ambulatory Visit (INDEPENDENT_AMBULATORY_CARE_PROVIDER_SITE_OTHER): Payer: BLUE CROSS/BLUE SHIELD | Admitting: Cardiology

## 2018-09-13 ENCOUNTER — Encounter: Payer: Self-pay | Admitting: Cardiology

## 2018-09-13 VITALS — BP 114/72 | HR 71 | Ht 71.0 in | Wt 219.0 lb

## 2018-09-13 DIAGNOSIS — E7800 Pure hypercholesterolemia, unspecified: Secondary | ICD-10-CM | POA: Diagnosis not present

## 2018-09-13 DIAGNOSIS — I712 Thoracic aortic aneurysm, without rupture, unspecified: Secondary | ICD-10-CM

## 2018-09-13 DIAGNOSIS — I2693 Single subsegmental pulmonary embolism without acute cor pulmonale: Secondary | ICD-10-CM

## 2018-09-13 DIAGNOSIS — I422 Other hypertrophic cardiomyopathy: Secondary | ICD-10-CM

## 2018-09-13 NOTE — Patient Instructions (Signed)
Continue Xarelto 15 mg twice a day then 20 mg daily  Avoid ASA or NSAIDs  We will check an Echocardiogram  We will plan to repeat CT of the aorta in 6 months  Follow up in 3 months

## 2018-09-17 ENCOUNTER — Ambulatory Visit (HOSPITAL_COMMUNITY): Payer: BLUE CROSS/BLUE SHIELD | Attending: Cardiovascular Disease

## 2018-09-17 DIAGNOSIS — E7800 Pure hypercholesterolemia, unspecified: Secondary | ICD-10-CM | POA: Diagnosis not present

## 2018-09-17 DIAGNOSIS — I712 Thoracic aortic aneurysm, without rupture, unspecified: Secondary | ICD-10-CM

## 2018-09-17 DIAGNOSIS — I422 Other hypertrophic cardiomyopathy: Secondary | ICD-10-CM | POA: Diagnosis not present

## 2018-09-17 DIAGNOSIS — I2693 Single subsegmental pulmonary embolism without acute cor pulmonale: Secondary | ICD-10-CM | POA: Diagnosis not present

## 2018-10-08 ENCOUNTER — Other Ambulatory Visit: Payer: Self-pay | Admitting: Vascular Surgery

## 2018-10-08 MED ORDER — RIVAROXABAN 20 MG PO TABS: 20.0000 mg | ORAL_TABLET | Freq: Every day | ORAL | 6 refills | Status: DC

## 2018-10-09 ENCOUNTER — Telehealth: Payer: Self-pay | Admitting: Vascular Surgery

## 2018-10-09 NOTE — Telephone Encounter (Signed)
-----   Message from Sherren Kerns, MD sent at 10/09/2018  8:02 AM EST ----- Regarding: RE: Referral Yes that is fine ----- Message ----- From: Yolonda Kida, LPN Sent: 3/49/1791   4:46 PM EST To: Sherren Kerns, MD Subject: Referral                                       Hey Dr. Darrick Penna.  This patient called wanting you to give him a referral to see a Pulmonologist Dr. Army Melia.  Is this ok?  Cell [phone].  Thanks,   Ernst Spell., LPN

## 2018-10-14 ENCOUNTER — Telehealth: Payer: Self-pay | Admitting: Pulmonary Disease

## 2018-10-14 NOTE — Telephone Encounter (Signed)
Keep an eye out for an opening, or consider using a day when I have two held spots open, then could use both of those.

## 2018-10-14 NOTE — Telephone Encounter (Signed)
Checked both BQ's [place] and [place] schedules and the scheduled consult on 12/03/2018 is the first avail.  Dr. Kendrick Fries, please advise if there is anything we could do to try to get pt seen with you for a sooner appt as pt would like to see if he could be seen sooner or if we just need to leave pt's appt as is? Thanks!

## 2018-10-14 NOTE — Telephone Encounter (Signed)
Returned call to patient, made aware we do not have a sooner opening at this time but we could keep an eye out for an opening. Voiced understanding. As of right now BQ hold spots are 15 minutes and separated time slots, they are not together to make a 30 minute slot. I made patient aware to keep his appt and if anything changed we would call him. Patient voiced understanding. Nothing further is needed at this time.

## 2018-11-27 ENCOUNTER — Other Ambulatory Visit: Payer: Self-pay

## 2018-11-27 DIAGNOSIS — I2699 Other pulmonary embolism without acute cor pulmonale: Secondary | ICD-10-CM

## 2018-11-27 DIAGNOSIS — I83811 Varicose veins of right lower extremities with pain: Secondary | ICD-10-CM

## 2018-12-03 ENCOUNTER — Telehealth: Payer: Self-pay

## 2018-12-03 ENCOUNTER — Institutional Professional Consult (permissible substitution): Payer: BLUE CROSS/BLUE SHIELD | Admitting: Pulmonary Disease

## 2018-12-03 NOTE — Telephone Encounter (Signed)
Left message for patient to call back to reschedule e-visit with Dr. Swaziland.

## 2018-12-11 ENCOUNTER — Encounter (HOSPITAL_COMMUNITY): Payer: BLUE CROSS/BLUE SHIELD

## 2018-12-11 ENCOUNTER — Ambulatory Visit: Payer: BLUE CROSS/BLUE SHIELD | Admitting: Vascular Surgery

## 2018-12-11 ENCOUNTER — Telehealth (HOSPITAL_COMMUNITY): Payer: Self-pay | Admitting: Rehabilitation

## 2018-12-11 NOTE — Telephone Encounter (Signed)
The above patient or their representative was contacted and gave the following answers to these questions:         Do you have any of the following symptoms? No  Fever                    Cough                   Shortness of breath  Do  you have any of the following other symptoms? No   muscle pain         vomiting,        diarrhea        rash         weakness        red eye        abdominal pain         bruising          bruising or bleeding              joint pain           severe headache    Have you been in contact with someone who was or has been sick in the past 2 weeks? No  Yes                 Unsure                         Unable to assess   Does the person that you were in contact with have any of the following symptoms?   Cough         shortness of breath           muscle pain         vomiting,            diarrhea            rash            weakness           fever            red eye           abdominal pain           bruising  or  bleeding                joint pain                severe headache               Have you  or someone you have been in contact with traveled internationally in th last month? No        If yes, which countries?   Have you  or someone you have been in contact with traveled outside [place] in th last month? No         If yes, which state and city?   COMMENTS OR ACTION PLAN FOR THIS PATIENT:          

## 2018-12-12 ENCOUNTER — Ambulatory Visit (INDEPENDENT_AMBULATORY_CARE_PROVIDER_SITE_OTHER): Payer: Self-pay | Admitting: Vascular Surgery

## 2018-12-12 ENCOUNTER — Other Ambulatory Visit: Payer: Self-pay

## 2018-12-12 ENCOUNTER — Ambulatory Visit (HOSPITAL_COMMUNITY)
Admission: RE | Admit: 2018-12-12 | Discharge: 2018-12-12 | Disposition: A | Discharge status: Home / Self Care | Payer: BLUE CROSS/BLUE SHIELD | Source: Ambulatory Visit | Attending: Family | Admitting: Family

## 2018-12-12 ENCOUNTER — Encounter: Payer: Self-pay | Admitting: Vascular Surgery

## 2018-12-12 VITALS — BP 130/89 | HR 62 | Temp 98.1°F | Resp 18 | Ht 71.0 in | Wt 209.8 lb

## 2018-12-12 DIAGNOSIS — I2699 Other pulmonary embolism without acute cor pulmonale: Secondary | ICD-10-CM | POA: Insufficient documentation

## 2018-12-12 DIAGNOSIS — I83811 Varicose veins of right lower extremities with pain: Secondary | ICD-10-CM | POA: Diagnosis not present

## 2018-12-12 MED ORDER — RIVAROXABAN 20 MG PO TABS: 20.0000 mg | ORAL_TABLET | Freq: Every day | ORAL | 3 refills | Status: DC

## 2018-12-12 NOTE — Progress Notes (Signed)
Patient is a 73 year old male who returns for follow-up today.  He previously underwent laser ablation with stab avulsions in the right leg January 2020.  He had an excellent result with resolution of his varicose veins.  However he did sustain a pulmonary embolus from a EHIT.  He has been on Xarelto for the last 3 months.  He states his initial shortness of breath has improved but has not completely resolved.  He is also followed by Dr. Peter Swaziland and echocardiogram did show some right heart strain initially.  He is due to see Dr. Swaziland again in July of this year.  He denies any worsening of shortness of breath.  He denies any hemoptysis.  He states he has been compliant with his Xarelto.  He is requesting to do a full 52-month course of Xarelto at this point.  Physical exam:  Vitals:   12/12/18 1224  BP: 130/89  Pulse: 62  Resp: 18  Temp: 98.1 F (36.7 C)  SpO2: 96%  Weight: 209 lb 12.8 oz (95.2 kg)  Height: 5\' 11"  (1.803 m)    Right lower extremity: Well-healed incisions no significant edema  Data: Patient had a duplex ultrasound today which showed no evidence of DVT and successful closure of his right greater saphenous vein.  Assessment: Patient doing well status post laser ablation stab avulsions right greater saphenous vein.  This was complicated by pulmonary embolus.  His pulmonary function seems to be returning towards normal.  He is requesting to do a full 6 months of Xarelto.  I discussed with him the risks of Xarelto as far as bleeding complications.  Plan: Patient will continue his Xarelto for 3 more months.  This prescription was renewed today.  I encouraged him to follow-up with Dr. Sw[place]and in July and will leave it at Dr. Elvis Coil discretion whether or not repeat echocardiogram is necessary.  He will follow-up with me on an as-needed basis.  Fabienne Bruns, MD [facility] Office: [phone] Pager: [phone]

## 2018-12-16 ENCOUNTER — Ambulatory Visit: Payer: BLUE CROSS/BLUE SHIELD | Admitting: Cardiology

## 2019-01-09 DIAGNOSIS — E039 Hypothyroidism, unspecified: Secondary | ICD-10-CM | POA: Diagnosis not present

## 2019-01-16 DIAGNOSIS — E063 Autoimmune thyroiditis: Secondary | ICD-10-CM | POA: Diagnosis not present

## 2019-01-16 DIAGNOSIS — E039 Hypothyroidism, unspecified: Secondary | ICD-10-CM | POA: Diagnosis not present

## 2019-02-11 ENCOUNTER — Telehealth: Payer: Self-pay | Admitting: Cardiology

## 2019-02-11 DIAGNOSIS — I712 Thoracic aortic aneurysm, without rupture, unspecified: Secondary | ICD-10-CM

## 2019-02-11 NOTE — Telephone Encounter (Signed)
Patient is calling because he normally gets a CT done before his appt. Please place orders to be scheduled.

## 2019-02-11 NOTE — Telephone Encounter (Signed)
Spoke with pt, order for the CTA of aorta to follow up thoracic aneurysm placed and patient will call [facility] to schedule.

## 2019-02-12 ENCOUNTER — Other Ambulatory Visit: Payer: Self-pay | Admitting: Cardiology

## 2019-02-18 NOTE — Progress Notes (Signed)
Virtual Visit via Video Note   This visit type was conducted due to national recommendations for restrictions regarding the COVID-19 Pandemic (e.g. social distancing) in an effort to limit this patient's exposure and mitigate transmission in our community.  Due to his co-morbid illnesses, this patient is at least at moderate risk for complications without adequate follow up.  This format is felt to be most appropriate for this patient at this time.  All issues noted in this document were discussed and addressed.  A limited physical exam was performed with this format.  Please refer to the patient's chart for his consent to telehealth for [facility].   Date:  02/21/2019   ID:  Chase Hill, DOB April 07, 1946, MRN [id number]  Patient Location: Home Provider Location: Home  PCP:  Jarome MatinPaterson, Daniel, MD  Cardiologist:  Shaylon Gillean [place] MD Electrophysiologist:  None   Evaluation Performed:  Follow-Up Visit  Chief Complaint:  Follow up HCM  History of Present Illness:    Chase Hill is a 73 y.o. male with history of HCM.  He was evaluated in 2011 for chest pain. Stress Myoview showed inferior/apical ischemia but cardiac cath was done and showed no obstructive CAD. In 2014 he had an Echo done. This showed basal septal hypertrophy with mild LV outflow tract gradient and chordal versus valvular SAM, trivial MR, and moderate diastolic dysfunction. He had cardiac MRI in 6/14 showing mild focal basal septal hypertrophy with systolic anterior motion of the mitral valve and mild MR. There was no delayed enhancement. He was tried on a beta blocker but was unable to tolerate this due to fatigue and orthostasis. He has a history of HLD, GERD, RBBB, and hypothyroidism. Intolerant of pravastatin due to myalgias.   On his last visit Echo in July 2018  showed increased septal hypertrophy and outflow gradient. He was started on Cardizem 120 mg daily. This was later discontinued due to concern with his  conduction system abnormality with first degree AV block, LAFB and RBBB. A CT chest was done and demonstrated a thoracic aneurysm measuring 4.2 cm. Repeat CT in September 2019 was stable. Echo was done in February this year with typical features on HOCM. 50 mmHg LVOT gradient at rest that increased to 90 mm Hg with Valsalva. No significant RV dysfunction.   He previously underwent laser ablation with stab avulsions in the right leg January 2020.  He had an excellent result with resolution of his varicose veins.  However he did sustain a pulmonary embolus from a EHIT. Has been on Xarelto with plans to complete 6 months therapy.  On follow up today he reports he still has dyspnea when going up stairs or an incline. No cough, edema, chest pain. Weight is down. Does feel that breathing has improved over the last 6-8 weeks.  Planning to have colonoscopy when off Xarelto.   The patient does not have symptoms concerning for COVID-19 infection (fever, chills, cough, or new shortness of breath).    Past Medical History:  Diagnosis Date   Cervical radiculopathy at C7    PMH of   Diverticulosis    colonoscopy 10-15-2006   GERD (gastroesophageal reflux disease)    Sullivan LoneGilbert syndrome    Hemorrhoids    Hyperlipidemia    Hypothyroidism    Perennial allergic rhinitis with seasonal variation    Past Surgical History:  Procedure Laterality Date   COLONOSCOPY  2010   Tics   ENDOVENOUS ABLATION SAPHENOUS VEIN W/ LASER Right 08/21/2018  endovenous laser ablation right greater saphenous vein and stab phlebectomy > 20 incisions right leg by Fabienne Brunsharles Fields MD    WISDOM TOOTH EXTRACTION       Current Meds  Medication Sig   levothyroxine (SYNTHROID, LEVOTHROID) 125 MCG tablet TAKE 1 TABLET BY MOUTH EVERY MORNING ON an empty stomach   Multiple Vitamin (MULTIVITAMIN) tablet Take 1 tablet by mouth daily.     rivaroxaban (XARELTO) 20 MG TABS tablet Take 1 tablet (20 mg total) by mouth daily with  breakfast.   rosuvastatin (CRESTOR) 5 MG tablet Take 5 mg by mouth 3 (three) times a week.     Allergies:   Patient has no known allergies.   Social History   Tobacco Use   Smoking status: Never Smoker   Smokeless tobacco: Never Used  Substance Use Topics   Alcohol use: Yes    Alcohol/week: 2.0 standard drinks    Types: 2 Glasses of wine per week    Comment:  < 3 /week , only socially   Drug use: No     Family Hx: The patient's family history includes Breast cancer in his mother; Congestive Heart Failure in his father; Coronary artery disease in his father and paternal aunt; Stroke (age of onset: 7189) in his maternal grandmother. There is no history of Diabetes, Hypertension, or Colon cancer.  ROS:   Please see the history of present illness.    All other systems reviewed and are negative.   Prior CV studies:   The following studies were reviewed today:  Echo 10/03/18: IMPRESSIONS    1. There is moderate asymmetrical septal hypertrophy and hyperdynamic systolic function of the left ventricle with dynamic LV outflow obstruction.  2. Echo evidence of pseudonormalization in diastolic relaxation. Elevated mean left atrial pressure  3. The peak gradient across the LVOT is 50 mm Hg at rest and increases to 90 mm Hg following the Valsalva maneuver.  4. Severe anterior leaflet systolic anterior motion of the mitral valve.  5. Mild-moderate eccentric mitral insufficiency.  6. Severely dilated left atrial size.  7. No evidence of left ventricular regional wall motion abnormalities.  8. The right ventricle has normal systolic function. The cavity in normal in size. There is no increase in right ventricular wall thickness. Right ventricular systolic pressure could not be assessed.  9. The aortic valve is normal in structure and function. 10. Mildly dilated ascending aorta.  CLINICAL DATA:  Follow-up TAA  EXAM: CT ANGIOGRAPHY CHEST WITH CONTRAST  TECHNIQUE: Multidetector  CT imaging of the chest was performed using the standard protocol during bolus administration of intravenous contrast. Multiplanar CT image reconstructions and MIPs were obtained to evaluate the vascular anatomy.  CONTRAST:  75mL ISOVUE-370 IOPAMIDOL (ISOVUE-370) INJECTION 76%  COMPARISON:  09/11/2018, 04/22/2018, 03/23/2017  FINDINGS: Cardiovascular: Preferential opacification of the thoracic aorta. No change in caliber of the tubular ascending thoracic aorta, measuring 4.6 x 4.3 cm, not significantly changed in caliber, measuring 4.5 x 4.2 cm on examinations dating back to 03/23/2017. The sinuses of Valsalva measure up to 4.4 cm in caliber. Normal caliber of the aortic valve. The descending thoracic aorta is normal in caliber. No significant atherosclerosis. Normal heart size. No pericardial effusion. Examination is not tailored for the evaluation of pulmonary embolism, however there is no obvious residual pulmonary embolism in the right lower lobe at the site of embolism seen on prior examination dated 09/11/2018.  Mediastinum/Nodes: No enlarged mediastinal, hilar, or axillary lymph nodes. Thyroid gland, trachea, and esophagus demonstrate no  significant findings.  Lungs/Pleura: Stable, benign small pulmonary nodules, for example a 3 mm nodule of the right pulmonary apex (series 8, image 35). No pleural effusion or pneumothorax.  Upper Abdomen: No acute abnormality.  Musculoskeletal: No chest wall abnormality. No acute or significant osseous findings.  Review of the MIP images confirms the above findings.  IMPRESSION: 1. No change in caliber of the tubular ascending thoracic aorta, measuring 4.6 x 4.3 cm, not significantly changed in caliber, measuring 4.5 x 4.2 cm on examinations dating back to 03/23/2017. The sinuses of Valsalva measure up to 4.4 cm in caliber. Normal caliber of the aortic valve. The descending thoracic aorta is normal in caliber. No  significant atherosclerosis.  2. Examination is not tailored for the evaluation of pulmonary embolism, however there is no obvious residual pulmonary embolism in the right lower lobe at the site of embolism seen on prior examination dated 09/11/2018.  3.  Stable, benign small pulmonary nodules.   Electronically Signed   By: Eddie Candle M.D.   On: 02/19/2019 17:52  Labs/Other Tests and Data Reviewed:    EKG:  No ECG reviewed.  Recent Labs: No results found for requested labs within last 8760 hours.   Recent Lipid Panel Lab Results  Component Value Date/Time   CHOL 167 08/24/2016 07:27 AM   CHOL 202 (H) 04/29/2015 07:34 AM   TRIG 108.0 08/24/2016 07:27 AM   TRIG 165 (H) 04/29/2015 07:34 AM   HDL 51.00 08/24/2016 07:27 AM   HDL 47 04/29/2015 07:34 AM   CHOLHDL 3 08/24/2016 07:27 AM   LDLCALC 95 08/24/2016 07:27 AM   LDLCALC 122 (H) 04/29/2015 07:34 AM   LDLDIRECT 78.7 03/28/2013 07:32 AM   Dated 08/09/18: cholesterol 201, triglycerides 146, HDL 47, LDL 105. Creatinine 1.3. Otherwise CBC, CMET, and TFTs normal.    Wt Readings from Last 3 Encounters:  02/21/19 206 lb (93.4 kg)  12/12/18 209 lb 12.8 oz (95.2 kg)  09/13/18 219 lb (99.3 kg)     Objective:    Vital Signs:  Ht 5\' 11"  (1.803 m)    Wt 206 lb (93.4 kg)    BMI 28.73 kg/m    VITAL SIGNS:  reviewed  General no distress HEENT. Wears glasses. Sclera are clear Respirations unlabored Skin clear Neuro alert and oriented x 3 Mood normal.  ASSESSMENT & PLAN:    1.  HOCM He has a mild-moderate LVOT gradient. Cardiac MRI showed mild focal basal septal hypertrophy with mitral valve SAM and mild MR. There was no myocardial delayed enhancement. Recent Echo shows resting LVOT gradient of 50 mm Hg. Mild to moderate MR with severe SAM. Elevated LA pressure and dilated LA. Intolerant of beta blocker and diltiazem. Even with very low dose of beta blocker, patient had lightheadedness and fatigue and has similar  problems on Cardizem. Will avoid AV nodal blocking especially since he has conduction system disease on Ecg.   2. Acute pulmonary embolus. January 2020. Provoked by laser vein treatment. Patient to complete 6 months of treatment then discontinue. Will then resume ASA. 3. RBBB/LAFB 4. Thoracic aortic aneurysm. 4.6 cm by recent CT. Stable going back to 2018. Will need yearly CT. Instructed to avoid fluoroquinolone antibiotics.  5. Hypercholesterolemia on low dose crestor 6. Dyspnea on exertion. Multifactorial. S/p PE. Has evidence of diastolic dysfunction on Echo. Does not appear to be significantly volume overloaded though so will not start diuretic yet. I suspect there is a component of deconditioning as well since activity less since  January. Will work on his exercise to improve conditioning. If no improvement over the next few months may need to consider a trial of diuretics or right heart cath.   COVID-19 Education: The signs and symptoms of COVID-19 were discussed with the patient and how to seek care for testing (follow up with PCP or arrange E-visit).  The importance of social distancing was discussed today.  Time:   Today, I have spent 15 minutes with the patient with telehealth technology discussing the above problems.     Medication Adjustments/Labs and Tests Ordered: Current medicines are reviewed at length with the patient today.  Concerns regarding medicines are outlined above.   Tests Ordered: No orders of the defined types were placed in this encounter.   Medication Changes: No orders of the defined types were placed in this encounter.   Follow Up:  In Person in 6 month(s)  Signed, Titiana Severa [place], MD  02/21/2019 10:36 AM    [facility]

## 2019-02-19 ENCOUNTER — Ambulatory Visit
Admission: RE | Admit: 2019-02-19 | Discharge: 2019-02-19 | Disposition: A | Discharge status: Home / Self Care | Payer: BC Managed Care – PPO | Source: Ambulatory Visit | Attending: Cardiology | Admitting: Cardiology

## 2019-02-19 DIAGNOSIS — I712 Thoracic aortic aneurysm, without rupture, unspecified: Secondary | ICD-10-CM

## 2019-02-19 MED ORDER — IOPAMIDOL (ISOVUE-370) INJECTION 76%
75.0000 mL | Freq: Once | INTRAVENOUS | Status: AC | PRN
  Administered 2019-02-19: 75 mL via INTRAVENOUS

## 2019-02-20 ENCOUNTER — Telehealth: Payer: Self-pay | Admitting: Cardiology

## 2019-02-20 NOTE — Telephone Encounter (Signed)
LVM, reminding pt of his appt with Dr Martinique on 02-21-19.Marland Kitchen

## 2019-02-21 ENCOUNTER — Telehealth: Payer: Self-pay | Admitting: *Deleted

## 2019-02-21 ENCOUNTER — Telehealth (INDEPENDENT_AMBULATORY_CARE_PROVIDER_SITE_OTHER): Payer: BC Managed Care – PPO | Admitting: Cardiology

## 2019-02-21 ENCOUNTER — Encounter: Payer: Self-pay | Admitting: Cardiology

## 2019-02-21 VITALS — Ht 71.0 in | Wt 206.0 lb

## 2019-02-21 DIAGNOSIS — I422 Other hypertrophic cardiomyopathy: Secondary | ICD-10-CM | POA: Diagnosis not present

## 2019-02-21 DIAGNOSIS — Z86711 Personal history of pulmonary embolism: Secondary | ICD-10-CM | POA: Diagnosis not present

## 2019-02-21 DIAGNOSIS — E7800 Pure hypercholesterolemia, unspecified: Secondary | ICD-10-CM

## 2019-02-21 DIAGNOSIS — I712 Thoracic aortic aneurysm, without rupture, unspecified: Secondary | ICD-10-CM

## 2019-02-21 NOTE — Telephone Encounter (Signed)
LMTCB and will hold in the triage basket to f/u on

## 2019-02-21 NOTE — Telephone Encounter (Signed)
-----   Message from Juanito Doom, MD sent at 02/20/2019  5:30 PM EDT ----- Hi Triage,  This patient needs an appointment for pulmonary embolism ASAP please.  He has been trying to get in to see me for several months, but was unaware that I am not working in clinic.  Can we get him arranged for a consult with a provider ASAP?  Thanks, Ruby Cola

## 2019-02-21 NOTE — Telephone Encounter (Signed)
ATC pt, line went to voicemail. LMTCB X2 Will leave in triage box to f/u on as BQ would like to schedule this pt for a consultation appt w/ another provider ASAP.

## 2019-02-21 NOTE — Patient Instructions (Addendum)
Work on Soil scientist.   We will repeat CT of the aorta in one year  When you stop the Xarelto this month start back on ASA 81 mg daily  Follow up in 6 months.   Call 3 months before to schedule

## 2019-02-24 NOTE — Telephone Encounter (Signed)
Call made to patient, consult appt made with Dr. Loanne Drilling. Patient voices that he is not symptomatic but he wants to be seen to be sure he is okay. Voiced understanding. Nothing further is needed at this time.

## 2019-02-25 ENCOUNTER — Encounter: Payer: Self-pay | Admitting: Pulmonary Disease

## 2019-02-25 ENCOUNTER — Ambulatory Visit (INDEPENDENT_AMBULATORY_CARE_PROVIDER_SITE_OTHER): Payer: BC Managed Care – PPO | Admitting: Pulmonary Disease

## 2019-02-25 ENCOUNTER — Other Ambulatory Visit: Payer: Self-pay

## 2019-02-25 VITALS — BP 106/78 | HR 83 | Temp 97.7°F | Ht 71.0 in | Wt 213.2 lb

## 2019-02-25 DIAGNOSIS — I83891 Varicose veins of right lower extremities with other complications: Secondary | ICD-10-CM | POA: Diagnosis not present

## 2019-02-25 MED ORDER — RIVAROXABAN 20 MG PO TABS: 20.0000 mg | ORAL_TABLET | Freq: Every day | ORAL | 3 refills | Status: DC

## 2019-02-25 NOTE — Progress Notes (Signed)
Subjective:   PATIENT ID: Chase Hill GENDER: male DOB: 1946-04-24, MRN: [id number]   HPI  Chief Complaint  Patient presents with  . Follow-up    shortness of breath since 09/2018    Reason for Visit: New consult for pulmonary embolism  Chase Hill is a 73 year old male HOCM, varicose vein c/b SGV thrombosis, PE and AAA who presents for management of pulmonary embolism and reports of shortness of breath x 6 months.   He is previously seen by Vascular. Underwent laser ablation of his varicose veins which was complicated by right partially obstructing GSV thrombus extending from GSV into the right CFV. Shortly after, he developed shortness of breath and diagnosed with PE on 09/11/18 and started on anticoagulation. He has tolerated the medication without overt bruising or bleeding.  He describes himself as an active person, able to golf and carry his bags himself. However after his diagnosis, he was unable to golf at all and not able to climb a flight of stairs without feeling significantly short of breath and weak. Due to his symptoms, he was inactive for a few months and used the elevator at work to minimize his activity. Today, he reports he is able to climb a flight of stairs but still extremely limited in his activity. Walking uphill or for long periods of time are difficult for him and trigger his dyspnea. Rest improves it. Denies chest pain, wheezing, cough.  Social History: Production designer, theatre/television/filmracticing law  Environmental exposures:  [place]  I have personally reviewed patient's past medical/family/social history, allergies, current medications.  Past Medical History:  Diagnosis Date  . Cervical radiculopathy at C7    PMH of  . Diverticulosis    colonoscopy 10-15-2006  . GERD (gastroesophageal reflux disease)   . Gilbert syndrome   . Hemorrhoids   . Hyperlipidemia   . Hypothyroidism   . Perennial allergic rhinitis with seasonal variation   . Pulmonary embolism  (HCC)      Family History  Problem Relation Age of Onset  . Stroke Maternal Grandmother [id number]  . Breast cancer Mother   . Coronary artery disease Father   . Congestive Heart Failure Father   . Coronary artery disease Paternal Aunt   . Diabetes Neg Hx   . Hypertension Neg Hx   . Colon cancer Neg Hx      Social History   Occupational History  . Occupation: LAWYER  Tobacco Use  . Smoking status: Never Smoker  . Smokeless tobacco: Never Used  Substance and Sexual Activity  . Alcohol use: Yes    Alcohol/week: 2.0 standard drinks    Types: 2 Glasses of wine per week    Comment:  < 3 /week , only socially  . Drug use: No  . Sexual activity: Not on file    No Known Allergies   Outpatient Medications Prior to Visit  Medication Sig Dispense Refill  . levothyroxine (SYNTHROID, LEVOTHROID) 125 MCG tablet TAKE 1 TABLET BY MOUTH EVERY MORNING ON an empty stomach    . Multiple Vitamin (MULTIVITAMIN) tablet Take 1 tablet by mouth daily.      . rosuvastatin (CRESTOR) 5 MG tablet Take 5 mg by mouth 3 (three) times a week.    . rivaroxaban (XARELTO) 20 MG TABS tablet Take 1 tablet (20 mg total) by mouth daily with breakfast. 30 tablet 3   No facility-administered medications prior to visit.     Review of Systems  Constitutional: Negative for chills, diaphoresis,  fever, malaise/fatigue and weight loss.  HENT: Negative for congestion, ear pain and sore throat.   Respiratory: Positive for shortness of breath. Negative for cough, hemoptysis, sputum production and wheezing.   Cardiovascular: Negative for chest pain, palpitations and leg swelling.  Gastrointestinal: Negative for abdominal pain, heartburn and nausea.  Genitourinary: Negative for frequency.  Musculoskeletal: Negative for joint pain and myalgias.  Skin: Negative for itching and rash.  Neurological: Positive for weakness. Negative for dizziness and headaches.  Endo/Heme/Allergies: Does not bruise/bleed easily.   Psychiatric/Behavioral: Negative for depression. The patient is not nervous/anxious.      Objective:   Vitals:   02/25/19 1107 02/25/19 1108  BP:  106/78  Pulse:  83  Temp: 97.7 F (36.5 C)   TempSrc: Oral   SpO2:  97%  Weight: 213 lb 3.2 oz (96.7 kg)   Height: 5\' 11"  (1.803 m)    SpO2: 97 % O2 Device: None (Room air)  Physical Exam: General: Well-appearing, no acute distress HENT: [place], AT Eyes: EOMI, no scleral icterus Respiratory: Clear to auscultation bilaterally.  No crackles, wheezing or rales Cardiovascular: RRR, -M/R/G, no JVD GI: BS+, soft, nontender Extremities:-Edema,-tenderness Neuro: AAO x4, CNII-XII grossly intact Skin: Intact, no rashes or bruising Psych: Normal mood, normal affect  Data Reviewed:  Imaging:  08/29/18 LE Venous US: Right: Successful vein closure. Right partially obstructing GSV thrombus extending from GSV into the right CFV.  CTA Chest PE 02/25/19 - Acute nonocclusive RLL PE  4/30 LE Venous US Right: There is no evidence of deep vein thrombosis in the lower extremity. The common femoral vein is patent and compressible. The great saphenous vein appears thrombosed from the saphenofemoral junction to the distal thigh.  CTA Chest Aorta - Unchanged AAA, small pulmonary nodules <68mm. No obvious residual PE of the RLL noted however noted "examination is not tailored for evaluation of pulmonary embolism."  PFT: None  Labs: 03/13/17 BMP. Overall normal including normal Cr 1.14  Imaging, labs and tests noted above have been reviewed independently by me.    Assessment & Plan:   Discussion: 73 year old male with HOCM, varicose vein c/b SGV thrombosis, PE and AAA who presents for management of pulmonary embolism. PE was initially diagnosed 09/11/18 and started on anticoagulation. Likely provoked in setting of great saphenous vein thrombus with extension to right common femoral vein. Last venous LE [place] in April 2020 demonstrated improved GSV  thrombosis however still involving the saphenofemoral junction to the distal thigh. Patient is still at high risk of recurrence for thromboembolism. Would recommend continuing anticoagulation as long as there are no contraindications. Patient has been tolerating medication well. He is not a fall risk. We discussed risks and benefits of treating thrombus and patient agreed to continue anticoagulation.  Provoked pulmonary embolism Varicose veins complicated by great saphenous vein thrombosis involving the saphenofemoral junction  Tests ordered:      Venous ultrasound of the right lower extremity      If test shows blood clot, we will continue Xarelto      If test negative, will need dedicated CTA to ensure resolution of PE before discontinuing anticoagulation  Medications ordered:      Xarelto refilled  Shortness of breath Management as above. If remains symptomatic, could consider PFTs to rule out obstructive or restrictive lung process. Though my pre-test probability for these issues are low at this time.  Encouraged aerobic activity to improve strength and endurance.  Follow-up in 3 months  Health Maintenance Pneumovax 10/31/13 Prevnar  04/28/15 Influenza 05/20/18  Greater than 50% of this patient 80-minute office visit was spent face-to-face in counseling with the patient/family. We discussed medical diagnosis, reviewed CT scan, US reports and treatment plan as noted. Addressed patient's questions regarding risk and benefits of treatment plan. Also outlined general aerobic activity recommendations to include cardio and endurance exercises.  No orders of the defined types were placed in this encounter.  Meds ordered this encounter  Medications  . rivaroxaban (XARELTO) 20 MG TABS tablet    Sig: Take 1 tablet (20 mg total) by mouth daily with breakfast.    Dispense:  30 tablet    Refill:  3    Return in about 3 months (around 05/28/2019).  Chi Mechele CollinJane Ellison, MD [facility] 02/25/2019 12:10 PM  Office Number [phone]

## 2019-02-25 NOTE — Patient Instructions (Addendum)
Provoked pulmonary embolism Varicose veins complicated by great saphenous vein thrombosis involving the saphenofemoral junction  Tests ordered:      Venous ultrasound of the right lower extremity      If test shows blood clot, we will continue Xarelto  Medications ordered:      Xarelto refilled  Follow-up in 3 months

## 2019-02-27 ENCOUNTER — Other Ambulatory Visit: Payer: Self-pay

## 2019-02-27 ENCOUNTER — Ambulatory Visit (HOSPITAL_COMMUNITY)
Admission: RE | Admit: 2019-02-27 | Discharge: 2019-02-27 | Disposition: A | Discharge status: Home / Self Care | Payer: BC Managed Care – PPO | Source: Ambulatory Visit | Attending: Cardiology | Admitting: Cardiology

## 2019-02-27 DIAGNOSIS — L821 Other seborrheic keratosis: Secondary | ICD-10-CM | POA: Diagnosis not present

## 2019-02-27 DIAGNOSIS — L817 Pigmented purpuric dermatosis: Secondary | ICD-10-CM | POA: Diagnosis not present

## 2019-02-27 DIAGNOSIS — L570 Actinic keratosis: Secondary | ICD-10-CM | POA: Diagnosis not present

## 2019-02-27 DIAGNOSIS — I83891 Varicose veins of right lower extremities with other complications: Secondary | ICD-10-CM | POA: Insufficient documentation

## 2019-02-27 DIAGNOSIS — D225 Melanocytic nevi of trunk: Secondary | ICD-10-CM | POA: Diagnosis not present

## 2019-02-27 DIAGNOSIS — Z85828 Personal history of other malignant neoplasm of skin: Secondary | ICD-10-CM | POA: Diagnosis not present

## 2019-03-04 ENCOUNTER — Other Ambulatory Visit: Payer: Self-pay | Admitting: *Deleted

## 2019-03-04 DIAGNOSIS — I83891 Varicose veins of right lower extremities with other complications: Secondary | ICD-10-CM

## 2019-03-10 NOTE — Telephone Encounter (Signed)
Pulmonary Telephone Encounter  I have called patient to discuss recommendations regarding his DVT/PE. I advised patient to consider continuing Hematology consult based on the location of his thrombus. After communicating with his Vascular surgeon, Dr. [name], he will have his office arrange for a follow-up visit to discuss this as well. A hematology consult may provide some insight whether patient needs prolonged anticoagulation in setting of known risk factor however I will defer to his surgeon.   Patient expressed understanding and will discuss with Dr. [name] if he will keep his Hematology consult.  Rodman Pickle, M.D. [facility]

## 2019-03-10 NOTE — Telephone Encounter (Signed)
-----   Message from Elam Dutch, MD sent at 03/10/2019  7:47 AM EDT ----- Regarding: RE: Shared patient Usually this thrombus becomes well incorporated within a few months.  The intent of the procedure is to thrombose the Saphenofemoral junction.  I do not think he would need more than the standard 6 months of anticoagulation.  I can certainly see him back in the office to discuss. I will have our office arrange this.  Ruta Hinds ----- Message ----- From: Margaretha Seeds, MD Sent: 02/28/2019   4:20 PM EDT To: Chase Coe, RN, Elam Dutch, MD, # Subject: Shared patient                                 Dr. [name],  We have a shared patient that was seen in Pulmonary clinic for management of PE/DVT. I am concerned that Chase Hill will need ongoing anticoagulation with his persistent thrombus located at the saphenofemoral junction as this likely contributed to his PE. I plan to refer him to Hematology to evaluate for appropriate duration of anticoagulation or if he needs to have lifelong while this clot is present.  My question for you is if this patient would benefit from vein excision or any other therapy? If so, would you be able to arrange for him to be seen in clinic?   Thanks,  Rodman Pickle, MD

## 2019-03-10 NOTE — Telephone Encounter (Signed)
Dr Loanne Drilling, Patient emailed the office asking about the referral to hematology and what the hematologist could be looking for in his circumstances.  Please advise on what you would like triage to tell the patient.  Thank you.

## 2019-04-08 ENCOUNTER — Other Ambulatory Visit: Payer: Self-pay

## 2019-04-08 DIAGNOSIS — I824Y9 Acute embolism and thrombosis of unspecified deep veins of unspecified proximal lower extremity: Secondary | ICD-10-CM

## 2019-04-08 DIAGNOSIS — I83811 Varicose veins of right lower extremities with pain: Secondary | ICD-10-CM

## 2019-04-10 ENCOUNTER — Ambulatory Visit (INDEPENDENT_AMBULATORY_CARE_PROVIDER_SITE_OTHER): Payer: BC Managed Care – PPO | Admitting: Vascular Surgery

## 2019-04-10 ENCOUNTER — Other Ambulatory Visit: Payer: Self-pay

## 2019-04-10 ENCOUNTER — Encounter: Payer: Self-pay | Admitting: Vascular Surgery

## 2019-04-10 ENCOUNTER — Ambulatory Visit (HOSPITAL_COMMUNITY)
Admission: RE | Admit: 2019-04-10 | Discharge: 2019-04-10 | Disposition: A | Discharge status: Home / Self Care | Payer: BC Managed Care – PPO | Source: Ambulatory Visit | Attending: Vascular Surgery | Admitting: Vascular Surgery

## 2019-04-10 VITALS — BP 125/81 | HR 61 | Temp 97.7°F | Resp 20 | Ht 71.0 in | Wt 212.0 lb

## 2019-04-10 DIAGNOSIS — I2699 Other pulmonary embolism without acute cor pulmonale: Secondary | ICD-10-CM

## 2019-04-10 DIAGNOSIS — I824Y9 Acute embolism and thrombosis of unspecified deep veins of unspecified proximal lower extremity: Secondary | ICD-10-CM

## 2019-04-10 DIAGNOSIS — I824Y1 Acute embolism and thrombosis of unspecified deep veins of right proximal lower extremity: Secondary | ICD-10-CM | POA: Diagnosis not present

## 2019-04-10 NOTE — Progress Notes (Signed)
Patient is a 73 year old male who returns for follow-up today.  He underwent laser ablation of the right greater saphenous vein August 21, 2018.  This was complicated by a pulmonary embolus.  He has been on Xarelto since that time.  He still has some mild shortness of breath after climbing 2 flights of stairs.  However, he states he has not really tried to push his exercise capacity much more than that.  The pulmonary embolus was in the right lower lobe and was nonocclusive.  He has now had 7 months of treatment with Xarelto.  He does have a history of some cardiac dysfunction and is followed by Dr. Martinique for this.  He does have a history of an ascending aortic aneurysm also followed by Dr. Martinique.  Past Medical History:  Diagnosis Date  . Cervical radiculopathy at C7    PMH of  . Diverticulosis    colonoscopy 10-15-2006  . DVT (deep venous thrombosis) ([place])   . GERD (gastroesophageal reflux disease)   . Gilbert syndrome   . Hemorrhoids   . Hyperlipidemia   . Hypothyroidism   . Perennial allergic rhinitis with seasonal variation   . Pulmonary embolism [facility])    Past Surgical History:  Procedure Laterality Date  . COLONOSCOPY  2010   Tics  . ENDOVENOUS ABLATION SAPHENOUS VEIN W/ LASER Right 08/21/2018   endovenous laser ablation right greater saphenous vein and stab phlebectomy > 20 incisions right leg by Ruta Hinds MD   . WISDOM TOOTH EXTRACTION     Current Outpatient Medications on File Prior to Visit  Medication Sig Dispense Refill  . levothyroxine (SYNTHROID, LEVOTHROID) 125 MCG tablet TAKE 1 TABLET BY MOUTH EVERY MORNING ON an empty stomach    . Multiple Vitamin (MULTIVITAMIN) tablet Take 1 tablet by mouth daily.      . rivaroxaban (XARELTO) 20 MG TABS tablet Take 1 tablet (20 mg total) by mouth daily with breakfast. 30 tablet 3  . rosuvastatin (CRESTOR) 5 MG tablet Take 5 mg by mouth 3 (three) times a week.     No current facility-administered medications on file prior to  visit.     No Known Allergies  Review of systems: He has shortness of breath after climbing 2 flights of stairs.  He has no chest pain.  He has no fever  Physical exam:  Vitals:   04/10/19 1426  BP: 125/81  Pulse: 61  Resp: 20  Temp: 97.7 F (36.5 C)  SpO2: 96%  Weight: 212 lb (96.2 kg)  Height: 5\' 11"  (1.803 m)    Data: Patient had a duplex ultrasound of his right lower extremity and left common femoral vein today.  This showed successful closure of the right greater saphenous vein no evidence of DVT at this point saphenous vein is occluded to within 6 mm of the saphenofemoral junction.  No evidence of DVT left common femoral vein.  Assessment: Patient doing well status post laser ablation right greater saphenous vein.  I do not believe that the patient needs any further anticoagulation therapy.  He had a known event that caused him to have a pulmonary embolus.  He has had greater than 6 months of treatment with Xarelto.  I do not believe he has ongoing hypercoagulable state or any current nidus that puts him at risk of pulmonary embolus.  Plan: The patient will follow-up with [place] on an as-needed basis.  The patient did ask today about whether or not physical therapy might increase his exercise tolerance.  I discussed with him that we could certainly refer him to cardiac rehab if he wished.  However if he is fairly self-motivated I put him on a walking program of 30 minutes daily to start getting him back in reasonable physical condition.  He will try this first.  He will call us back if he wishes a referral.  Fabienne Brunsharles Fields, MD [facility] Office: [phone] Pager: [phone]

## 2019-04-14 DIAGNOSIS — I2693 Single subsegmental pulmonary embolism without acute cor pulmonale: Secondary | ICD-10-CM | POA: Diagnosis not present

## 2019-04-24 ENCOUNTER — Other Ambulatory Visit: Payer: Self-pay

## 2019-04-24 ENCOUNTER — Ambulatory Visit (HOSPITAL_COMMUNITY): Payer: BC Managed Care – PPO | Attending: Internal Medicine

## 2019-04-24 DIAGNOSIS — I422 Other hypertrophic cardiomyopathy: Secondary | ICD-10-CM | POA: Insufficient documentation

## 2019-04-29 ENCOUNTER — Ambulatory Visit (INDEPENDENT_AMBULATORY_CARE_PROVIDER_SITE_OTHER): Payer: BC Managed Care – PPO | Admitting: Physician Assistant

## 2019-04-29 ENCOUNTER — Other Ambulatory Visit: Payer: Self-pay

## 2019-04-29 VITALS — BP 112/86 | HR 62 | Temp 97.6°F | Ht 71.0 in | Wt 212.0 lb

## 2019-04-29 DIAGNOSIS — I422 Other hypertrophic cardiomyopathy: Secondary | ICD-10-CM

## 2019-04-29 DIAGNOSIS — I712 Thoracic aortic aneurysm, without rupture, unspecified: Secondary | ICD-10-CM

## 2019-04-29 DIAGNOSIS — Z86711 Personal history of pulmonary embolism: Secondary | ICD-10-CM

## 2019-04-29 DIAGNOSIS — E7800 Pure hypercholesterolemia, unspecified: Secondary | ICD-10-CM | POA: Diagnosis not present

## 2019-04-29 DIAGNOSIS — E039 Hypothyroidism, unspecified: Secondary | ICD-10-CM

## 2019-04-29 NOTE — Patient Instructions (Signed)
Medication Instructions:  Your physician recommends that you continue on your current medications as directed. Please refer to the Current Medication list given to you today.  If you need a refill on your cardiac medications before your next appointment, please call your pharmacy.   Lab work: NONE ordered at this time of appointment   If you have labs (blood work) drawn today and your tests are completely normal, you will receive your results only by: Marland Kitchen MyChart Message (if you have MyChart) OR . A paper copy in the mail If you have any lab test that is abnormal or we need to change your treatment, we will call you to review the results.  Testing/Procedures: NONE ordered at this time of appointment   Follow-Up: At [facility], you and your health needs are our priority.  As part of our continuing mission to provide you with exceptional heart care, we have created designated Provider Care Teams.  These Care Teams include your primary Cardiologist (physician) and Advanced Practice Providers (APPs -  Physician Assistants and Nurse Practitioners) who all work together to provide you with the care you need, when you need it. . You will need a follow up appointment in 3 months with Chase Martinique, MD ONLY  Any Other Special Instructions Will Be Listed Below (If Applicable).

## 2019-04-29 NOTE — Progress Notes (Signed)
Cardiology Office Note    Date:  04/30/2019   ID:  Chase, Hill 1945-10-01, MRN [id number]  PCP:  Leanna Battles, MD  Cardiologist:  Dr. Martinique   Chief Complaint  Patient presents with  . Follow-up    discuss recent echo and consider Cath    History of Present Illness:  Chase Hill is a 73 y.o. male with PMH of hypertrophic cardiomyopathy, history of DVT/PE, TAA, hyperlipidemia, RBBB, Gilbert's syndrome, hypothyroidism.  He was previously followed by Dr. Aundra Dubin.  Stress Myoview in 2011 showed inferior and apical ischemia.  Subsequent cardiac cath showed no obstructive disease.  In 2014, echocardiogram demonstrated basal septal hypertrophy with mild LV outflow tract gradient and chordal versus valvular SAM, moderate diastolic dysfunction.  Cardiac MRI obtained in June 2014 showed mild focal basal septal hypertrophy with systolic anterior motion of the mitral valve and mild MR.  There was no significant delayed enhancement.  He has been intolerant of pravastatin due to myalgia.  He also gets fatigue and orthostasis on beta-blocker.  Due to increased septal hypertrophy and outflow gradient on previous echo in July 2018, he was started on Cardizem.  This was later discontinued due to concern of conduction disease with first-degree AV block, left anterior fascicular block and a right bundle branch block. He later underwent laser ablation of varicose veins by Dr. [name]. He was diagnosed with PE on CT angiogram of the chest in January 2029 and was placed on Xarelto.  PE was felt to be provoked by laser vein treatment.  He completed 6 months of therapy with Xarelto and switch to aspirin.  CT angiogram of the chest recently showed 4.6 cm thoracic aortic aneurysm.  Recent lower extremity venous Doppler obtained on 04/10/2019 was negative for DVT.  Repeat echocardiogram obtained on 04/24/2019, EF was greater than 65%, severe LVH, significant outflow obstruction at the mid cavity and  through LVOT with peak gradient of at least 253 mmHg, systolic anterior motion of mitral leaflet, mild dilatation of the ascending aorta measuring 43 mm.  Dr. Martinique recommended left and right heart cath.  Patient presents today for cardiology evaluation.  Despite the initial concern that his dyspnea is triggered by hypertrophic cardiomyopathy, the dyspnea actually improved after treatment for PE.  He says he was not exercising for close to 6 months.  He has not returned to exercising and able to go walk for 3 miles yesterday without any issue.  He denies any exertional dizziness or significant shortness of breath.  In this case since he is largely stable from a symptom perspective, I elected to discussed the case with Dr. Martinique.  He wished to continue to exercise for the next few weeks and see how he is doing before considering cardiac catheterization.  Dr. Martinique was agreeable with this.  I will hold off on arranging for cardiac catheterization at this time and arrange for him to see Dr. Martinique for follow-up in 3 months.   Past Medical History:  Diagnosis Date  . Cervical radiculopathy at C7    PMH of  . Diverticulosis    colonoscopy 10-15-2006  . DVT (deep venous thrombosis) ([place])   . GERD (gastroesophageal reflux disease)   . Gilbert syndrome   . Hemorrhoids   . Hyperlipidemia   . Hypothyroidism   . Perennial allergic rhinitis with seasonal variation   . Pulmonary embolism [facility])     Past Surgical History:  Procedure Laterality Date  . COLONOSCOPY  2010   Tics  .  ENDOVENOUS ABLATION SAPHENOUS VEIN W/ LASER Right 08/21/2018   endovenous laser ablation right greater saphenous vein and stab phlebectomy > 20 incisions right leg by Fabienne Bruns MD   . WISDOM TOOTH EXTRACTION      Current Medications: Outpatient Medications Prior to Visit  Medication Sig Dispense Refill  . levothyroxine (SYNTHROID, LEVOTHROID) 125 MCG tablet TAKE 1 TABLET BY MOUTH EVERY MORNING ON an empty stomach     . Multiple Vitamin (MULTIVITAMIN) tablet Take 1 tablet by mouth daily.      . rosuvastatin (CRESTOR) 5 MG tablet Take 5 mg by mouth 3 (three) times a week.    . rivaroxaban (XARELTO) 20 MG TABS tablet Take 1 tablet (20 mg total) by mouth daily with breakfast. (Patient not taking: Reported on 04/29/2019) 30 tablet 3   No facility-administered medications prior to visit.      Allergies:   Patient has no known allergies.   Social History   Socioeconomic History  . Marital status: Married    Spouse name: Not on file  . Number of children: 2  . Years of education: Not on file  . Highest education level: Not on file  Occupational History  . Occupation: LAWYER  Social Needs  . Financial resource strain: Not on file  . Food insecurity    Worry: Not on file    Inability: Not on file  . Transportation needs    Medical: Not on file    Non-medical: Not on file  Tobacco Use  . Smoking status: Never Smoker  . Smokeless tobacco: Never Used  Substance and Sexual Activity  . Alcohol use: Yes    Alcohol/week: 2.0 standard drinks    Types: 2 Glasses of wine per week    Comment:  < 3 /week , only socially  . Drug use: No  . Sexual activity: Not on file  Lifestyle  . Physical activity    Days per week: Not on file    Minutes per session: Not on file  . Stress: Not on file  Relationships  . Social Musician on phone: Not on file    Gets together: Not on file    Attends religious service: Not on file    Active member of club or organization: Not on file    Attends meetings of clubs or organizations: Not on file    Relationship status: Not on file  Other Topics Concern  . Not on file  Social History Narrative   Daily caffeine      Family History:  The patient's family history includes Breast cancer in his mother; Congestive Heart Failure in his father; Coronary artery disease in his father and paternal aunt; Stroke (age of onset: 62) in his maternal grandmother.   ROS:    Please see the history of present illness.    ROS All other systems reviewed and are negative.   PHYSICAL EXAM:   VS:  BP 112/86   Pulse 62   Temp 97.6 F (36.4 C) (Temporal)   Ht 5\' 11"  (1.803 m)   Wt 212 lb (96.2 kg)   BMI 29.57 kg/m    GEN: Well nourished, well developed, in no acute distress  HEENT: normal  Neck: no JVD, carotid bruits, or masses Cardiac: RRR; no murmurs, rubs, or gallops,no edema  Respiratory:  clear to auscultation bilaterally, normal work of breathing GI: soft, nontender, nondistended, + BS MS: no deformity or atrophy  Skin: warm and dry, no rash Neuro:  Alert and Oriented x 3, Strength and sensation are intact Psych: euthymic mood, full affect  Wt Readings from Last 3 Encounters:  04/29/19 212 lb (96.2 kg)  04/10/19 212 lb (96.2 kg)  02/25/19 213 lb 3.2 oz (96.7 kg)      Studies/Labs Reviewed:   EKG:  EKG is ordered today.  The ekg ordered today demonstrates normal sinus rhythm, first-degree AV block, right bundle branch block, left posterior fascicular block.  Recent Labs: No results found for requested labs within last 8760 hours.   Lipid Panel    Component Value Date/Time   CHOL 167 08/24/2016 0727   CHOL 202 (H) 04/29/2015 0734   TRIG 108.0 08/24/2016 0727   TRIG 165 (H) 04/29/2015 0734   HDL 51.00 08/24/2016 0727   HDL 47 04/29/2015 0734   CHOLHDL 3 08/24/2016 0727   VLDL 21.6 08/24/2016 0727   LDLCALC 95 08/24/2016 0727   LDLCALC 122 (H) 04/29/2015 0734   LDLDIRECT 78.7 03/28/2013 0732    Additional studies/ records that were reviewed today include:   CTA of chest 02/19/2019 IMPRESSION: 1. No change in caliber of the tubular ascending thoracic aorta, measuring 4.6 x 4.3 cm, not significantly changed in caliber, measuring 4.5 x 4.2 cm on examinations dating back to 03/23/2017. The sinuses of Valsalva measure up to 4.4 cm in caliber. Normal caliber of the aortic valve. The descending thoracic aorta is normal in caliber. No  significant atherosclerosis.  2. Examination is not tailored for the evaluation of pulmonary embolism, however there is no obvious residual pulmonary embolism in the right lower lobe at the site of embolism seen on prior examination dated 09/11/2018.  3.  Stable, benign small pulmonary nodules.   Venous doppler 04/10/2019 Summary: Right: Successful vein closure. There is no evidence of deep vein thrombosis in the lower extremity. No cystic structure found in the popliteal fossa. No significant change compared to previous study. Left: No evidence of common femoral vein obstruction.   *See table(s) above for measurements and observations.   Echo 04/24/2019 IMPRESSIONS  1. The left ventricle has hyperdynamic systolic function, with an ejection fraction of >65%. The cavity size was normal. There is severely increased left ventricular wall thickness. Left ventricular diastolic Doppler parameters are consistent with  impaired relaxation.  2. LVOT is narrowed The LV is severely hypertrophied. There is significant outflow obstruction at mid cavity and through LVOT with peak gradient at least 100 mm Hg. All consistent with HOCM.  3. The right ventricle has normal systolic function. The cavity was normal. There is no increase in right ventricular wall thickness.  4. Left atrial size was mildly dilated.  5. There is systolic anterior motion of mitral leaflets.  6. The aortic valve is tricuspid. Mild thickening of the aortic valve. Aortic valve regurgitation is mild by color flow Doppler.  7. The aorta is abnormal unless otherwise noted.  8. The ascending aorta is normal in size and structure.  9. There is mild dilatation of the ascending aorta measuring 43 mm.  ASSESSMENT:    1. Hypertrophic cardiomyopathy (HCC)   2. Thoracic aortic aneurysm without rupture (HCC)   3. Pure hypercholesterolemia   4. Hypothyroidism, unspecified type   5. History of pulmonary embolus (PE)      PLAN:  In  order of problems listed above:  1. Hypertrophic cardiomyopathy: Initial plan was to set up a left and right heart cath as Dr. [place] was concerned that his shortness of breath was related to worsening hypertrophic  cardiomyopathy.  However he is hesitant to proceed at this time.  He says his shortness of breath has significantly improved after being treated for PE.  I discussed the case with Dr. [place] again, he is okay with holding off on cardiac catheterization this time, we will reassess the patient in 572-month  2. Thoracic aortic aneurysm: Mildly dilated aorta on recent echocardiogram measuring up to 43 mm  3. Hyperlipidemia: Continue Crestor  4. Hypothyroidism: On Synthroid  5. History of PE: Finished 1783-month therapy with Xarelto, Xarelto have been discontinued since end of August 2020.    Medication Adjustments/Labs and Tests Ordered: Current medicines are reviewed at length with the patient today.  Concerns regarding medicines are outlined above.  Medication changes, Labs and Tests ordered today are listed in the Patient Instructions below. Patient Instructions  Medication Instructions:  Your physician recommends that you continue on your current medications as directed. Please refer to the Current Medication list given to you today.  If you need a refill on your cardiac medications before your next appointment, please call your pharmacy.   Lab work: NONE ordered at this time of appointment   If you have labs (blood work) drawn today and your tests are completely normal, you will receive your results only by: Marland Kitchen. MyChart Message (if you have MyChart) OR . A paper copy in the mail If you have any lab test that is abnormal or we need to change your treatment, we will call you to review the results.  Testing/Procedures: NONE ordered at this time of appointment   Follow-Up: At [facility], you and your health needs are our priority.  As part of our continuing mission to provide  you with exceptional heart care, we have created designated Provider Care Teams.  These Care Teams include your primary Cardiologist (physician) and Advanced Practice Providers (APPs -  Physician Assistants and Nurse Practitioners) who all work together to provide you with the care you need, when you need it. . You will need a follow up appointment in 3 months with Peter Swazil[place]Jordan, MD ONLY  Any Other Special Instructions Will Be Listed Below (If Applicable).       Ramond DialSigned, Eamonn Sermeno, [place]  04/30/2019 10:55 PM    [facility] [address], [place], [place]  [address] Phone: [phone](336) [phone]; Fax: [phone]

## 2019-04-30 ENCOUNTER — Encounter: Payer: Self-pay | Admitting: Physician Assistant

## 2019-05-09 ENCOUNTER — Ambulatory Visit (HOSPITAL_COMMUNITY): Payer: BC Managed Care – PPO

## 2019-05-09 ENCOUNTER — Encounter (HOSPITAL_COMMUNITY): Payer: BC Managed Care – PPO

## 2019-05-15 DIAGNOSIS — Z23 Encounter for immunization: Secondary | ICD-10-CM | POA: Diagnosis not present

## 2019-06-03 ENCOUNTER — Telehealth: Payer: Self-pay

## 2019-06-03 NOTE — Telephone Encounter (Signed)
Follow up   Patient states that he can't come today he is out of town.

## 2019-06-03 NOTE — Telephone Encounter (Signed)
Left message on patient's personal voice mail to call me this morning.Dr.Jordan has a cancellation this afternoon at 3:20 pm.Call and let me know if I can move your 10/23 virtual appointment to today.

## 2019-06-03 NOTE — H&P (View-Only) (Signed)
Virtual Visit via Video Note   This visit type was conducted due to national recommendations for restrictions regarding the COVID-19 Pandemic (e.g. social distancing) in an effort to limit this patient's exposure and mitigate transmission in our community.  Due to his co-morbid illnesses, this patient is at least at moderate risk for complications without adequate follow up.  This format is felt to be most appropriate for this patient at this time.  All issues noted in this document were discussed and addressed.  A limited physical exam was performed with this format.  Please refer to the patient's chart for his consent to telehealth for [facility].   Date:  06/06/2019   ID:  Chase Hill, DOB Sep 14, 1945, MRN [id number]  Patient Location: Home Provider Location: Home  PCP:  Leanna Battles, MD  Cardiologist:  Sharlize Hoar [place], MD  Electrophysiologist:  None   Evaluation Performed:  Follow-Up Visit  Chief Complaint:  HOCM  History of Present Illness:    Chase Hill is a 73 y.o. male with PMH of hypertrophic cardiomyopathy, history of DVT/PE, TAA, hyperlipidemia, RBBB, Gilbert's syndrome, hypothyroidism.  He was previously followed by Dr. Aundra Dubin.  Stress Myoview in 2011 showed inferior and apical ischemia.  Subsequent cardiac cath showed no obstructive disease.  In 2014, echocardiogram demonstrated basal septal hypertrophy with mild LV outflow tract gradient and chordal versus valvular SAM, moderate diastolic dysfunction.  Cardiac MRI obtained in June 2014 showed mild focal basal septal hypertrophy with systolic anterior motion of the mitral valve and mild MR.  There was no significant delayed enhancement.  He has been intolerant of pravastatin due to myalgia.  He also developed  fatigue and orthostasis on beta-blocker.  Due to increased septal hypertrophy and outflow gradient on previous echo in July 2018, he was started on Cardizem.  This was later discontinued due to concern  of conduction disease with first-degree AV block, left anterior fascicular block and a right bundle branch block. He later underwent laser ablation of varicose veins by Dr. [name]. He was diagnosed with PE on CT angiogram of the chest in January 2029 and was placed on Xarelto.  PE was felt to be provoked by laser vein treatment.  He completed 6 months of therapy with Xarelto and switch to aspirin.  CT angiogram of the chest recently showed 4.6 cm thoracic aortic aneurysm.  Recent lower extremity venous Doppler obtained on 04/10/2019 was negative for DVT.  Repeat echocardiogram obtained on 04/24/2019, EF was greater than 65%, severe LVH, significant outflow obstruction at the mid cavity and  LVOT with peak gradient of at least 272 mmHg, systolic anterior motion of mitral leaflet, mild dilatation of the ascending aorta measuring 43 mm. We initially recommended cardiac cath. He was seen by Almyra Deforest PA-C one month ago and patient reported improvement in dyspnea at that time. Therefore cardiac cath was not scheduled.   He reports that he was working on his exercise and everything seemed to improve. He was walking 2.5 miles per day and able to go up 2 flights of stairs. He was walking on his treadmill. On October 9 he was walking on his treadmill and felt like he hit the wall. He noted his HR dropped from 100 to 60 and he felt lightheaded and SOB. He had to stop. After 5 minutes rest he was able to resume walking. This happened again while on the treadmill and again HR dropped. Since then he has noticed more dyspnea on exertion and finds it difficult  to go up 2 flights of stairs. No orthopnea, PND, chest pain, or edema. No syncope.    The patient does not have symptoms concerning for COVID-19 infection (fever, chills, cough, or new shortness of breath).    Past Medical History:  Diagnosis Date  . Cervical radiculopathy at C7    PMH of  . Diverticulosis    colonoscopy 10-15-2006  . DVT (deep venous thrombosis)  (HCC)   . GERD (gastroesophageal reflux disease)   . Gilbert syndrome   . Hemorrhoids   . Hyperlipidemia   . Hypothyroidism   . Perennial allergic rhinitis with seasonal variation   . Pulmonary embolism [facility](HCC)    Past Surgical History:  Procedure Laterality Date  . COLONOSCOPY  2010   Tics  . ENDOVENOUS ABLATION SAPHENOUS VEIN W/ LASER Right 08/21/2018   endovenous laser ablation right greater saphenous vein and stab phlebectomy > 20 incisions right leg by Fabienne Brunsharles Fields MD   . WISDOM TOOTH EXTRACTION       Current Meds  Medication Sig  . levothyroxine (SYNTHROID, LEVOTHROID) 125 MCG tablet TAKE 1 TABLET BY MOUTH EVERY MORNING ON an empty stomach  . Multiple Vitamin (MULTIVITAMIN) tablet Take 1 tablet by mouth daily.    . rosuvastatin (CRESTOR) 5 MG tablet Take 5 mg by mouth 3 (three) times a week.     Allergies:   Patient has no known allergies.   Social History   Tobacco Use  . Smoking status: Never Smoker  . Smokeless tobacco: Never Used  Substance Use Topics  . Alcohol use: Yes    Alcohol/week: 2.0 standard drinks    Types: 2 Glasses of wine per week    Comment:  < 3 /week , only socially  . Drug use: No     Family Hx: The patient's family history includes Breast cancer in his mother; Congestive Heart Failure in his father; Coronary artery disease in his father and paternal aunt; Stroke (age of onset: 6089) in his maternal grandmother. There is no history of Diabetes, Hypertension, or Colon cancer.  ROS:   Please see the history of present illness.    All other systems reviewed and are negative.   Prior CV studies:   The following studies were reviewed today:  CTA of chest 02/19/2019 IMPRESSION: 1. No change in caliber of the tubular ascending thoracic aorta, measuring 4.6 x 4.3 cm, not significantly changed in caliber, measuring 4.5 x 4.2 cm on examinations dating back to 03/23/2017. The sinuses of Valsalva measure up to 4.4 cm in caliber. Normal caliber of  the aortic valve. The descending thoracic aorta is normal in caliber. No significant atherosclerosis.  2. Examination is not tailored for the evaluation of pulmonary embolism, however there is no obvious residual pulmonary embolism in the right lower lobe at the site of embolism seen on prior examination dated 09/11/2018.  3. Stable, benign small pulmonary nodules.   Venous doppler 04/10/2019 Summary: Right: Successful vein closure. There is no evidence of deep vein thrombosis in the lower extremity. No cystic structure found in the popliteal fossa. No significant change compared to previous study. Left: No evidence of common femoral vein obstruction.  *See table(s) above for measurements and observations.   Echo 04/24/2019 IMPRESSIONS 1. The left ventricle has hyperdynamic systolic function, with an ejection fraction of >65%. The cavity size was normal. There is severely increased left ventricular wall thickness. Left ventricular diastolic Doppler parameters are consistent with  impaired relaxation. 2. LVOT is narrowed The LV is severely  hypertrophied. There is significant outflow obstruction at mid cavity and through LVOT with peak gradient at least 100 mm Hg. All consistent with HOCM. 3. The right ventricle has normal systolic function. The cavity was normal. There is no increase in right ventricular wall thickness. 4. Left atrial size was mildly dilated. 5. There is systolic anterior motion of mitral leaflets. 6. The aortic valve is tricuspid. Mild thickening of the aortic valve. Aortic valve regurgitation is mild by color flow Doppler. 7. The aorta is abnormal unless otherwise noted. 8. The ascending aorta is normal in size and structure. 9. There is mild dilatation of the ascending aorta measuring 43 mm.   Labs/Other Tests and Data Reviewed:    EKG:  No ECG reviewed.  Recent Labs: No results found for requested labs within last 8760 hours.   Recent Lipid  Panel Lab Results  Component Value Date/Time   CHOL 167 08/24/2016 07:27 AM   CHOL 202 (H) 04/29/2015 07:34 AM   TRIG 108.0 08/24/2016 07:27 AM   TRIG 165 (H) 04/29/2015 07:34 AM   HDL 51.00 08/24/2016 07:27 AM   HDL 47 04/29/2015 07:34 AM   CHOLHDL 3 08/24/2016 07:27 AM   LDLCALC 95 08/24/2016 07:27 AM   LDLCALC 122 (H) 04/29/2015 07:34 AM   LDLDIRECT 78.7 03/28/2013 07:32 AM    Wt Readings from Last 3 Encounters:  06/06/19 205 lb (93 kg)  04/29/19 212 lb (96.2 kg)  04/10/19 212 lb (96.2 kg)     Objective:    Vital Signs:  BP 130/90   Ht 5\' 11"  (1.803 m)   Wt 205 lb (93 kg)   BMI 28.59 kg/m    VITAL SIGNS:  reviewed  General. NAD HEENT normal Respirations unlabored.  Neuro alert and oriented x 3.   ASSESSMENT & PLAN:    1. Hypertrophic cardiomyopathy: Most recent Echo with marked increase in LVOT gradient. Now with progressive symptoms. Intolerant of beta blocker in the past and I am concerned about starting Cardizem given evidence of conduction system disease and especially with bradycardia noted while exercising. I think we need to pursue further evaluation with right and left heart cath. We may also want to consider an event monitor and potentially cardiac MRI. He may need to be considered for either alcohol septal ablation or septal myectomy. Will plan on proceeding with cardiac cath in the next couple of weeks. The procedure and risks were reviewed including but not limited to death, myocardial infarction, stroke, arrythmias, bleeding, transfusion, emergency surgery, dye allergy, or renal dysfunction. The patient voices understanding and is agreeable to proceed.  2. Thoracic aortic aneurysm: Mildly dilated aorta on recent echocardiogram measuring up to 43 mm  3. Hyperlipidemia: Continue Crestor  4. Hypothyroidism: On Synthroid  5. History of PE: Finished 104-month therapy with Xarelto, Xarelto have been discontinued since end of August 2020. Seemed to recover well  from this.    COVID-19 Education: The signs and symptoms of COVID-19 were discussed with the patient and how to seek care for testing (follow up with PCP or arrange E-visit).  The importance of social distancing was discussed today.  Time:   Today, I have spent 20 minutes with the patient with telehealth technology discussing the above problems.     Medication Adjustments/Labs and Tests Ordered: Current medicines are reviewed at length with the patient today.  Concerns regarding medicines are outlined above.   Tests Ordered: No orders of the defined types were placed in this encounter.   Medication Changes:  No orders of the defined types were placed in this encounter.   Follow Up:  TBD   Signed, Ayyub Krall, MD  06/06/2019 8:25 AM    [facility]  

## 2019-06-03 NOTE — Progress Notes (Signed)
Virtual Visit via Video Note   This visit type was conducted due to national recommendations for restrictions regarding the COVID-19 Pandemic (e.g. social distancing) in an effort to limit this patient's exposure and mitigate transmission in our community.  Due to his co-morbid illnesses, this patient is at least at moderate risk for complications without adequate follow up.  This format is felt to be most appropriate for this patient at this time.  All issues noted in this document were discussed and addressed.  A limited physical exam was performed with this format.  Please refer to the patient's chart for his consent to telehealth for [facility].   Date:  06/06/2019   ID:  Chase Hill, DOB Sep 14, 1945, MRN [id number]  Patient Location: Home Provider Location: Home  PCP:  Leanna Battles, MD  Cardiologist:   [place], MD  Electrophysiologist:  None   Evaluation Performed:  Follow-Up Visit  Chief Complaint:  HOCM  History of Present Illness:    Chase Hill is a 73 y.o. male with PMH of hypertrophic cardiomyopathy, history of DVT/PE, TAA, hyperlipidemia, RBBB, Gilbert's syndrome, hypothyroidism.  He was previously followed by Dr. Aundra Dubin.  Stress Myoview in 2011 showed inferior and apical ischemia.  Subsequent cardiac cath showed no obstructive disease.  In 2014, echocardiogram demonstrated basal septal hypertrophy with mild LV outflow tract gradient and chordal versus valvular SAM, moderate diastolic dysfunction.  Cardiac MRI obtained in June 2014 showed mild focal basal septal hypertrophy with systolic anterior motion of the mitral valve and mild MR.  There was no significant delayed enhancement.  He has been intolerant of pravastatin due to myalgia.  He also developed  fatigue and orthostasis on beta-blocker.  Due to increased septal hypertrophy and outflow gradient on previous echo in July 2018, he was started on Cardizem.  This was later discontinued due to concern  of conduction disease with first-degree AV block, left anterior fascicular block and a right bundle branch block. He later underwent laser ablation of varicose veins by Dr. [name]. He was diagnosed with PE on CT angiogram of the chest in January 2029 and was placed on Xarelto.  PE was felt to be provoked by laser vein treatment.  He completed 6 months of therapy with Xarelto and switch to aspirin.  CT angiogram of the chest recently showed 4.6 cm thoracic aortic aneurysm.  Recent lower extremity venous Doppler obtained on 04/10/2019 was negative for DVT.  Repeat echocardiogram obtained on 04/24/2019, EF was greater than 65%, severe LVH, significant outflow obstruction at the mid cavity and  LVOT with peak gradient of at least 272 mmHg, systolic anterior motion of mitral leaflet, mild dilatation of the ascending aorta measuring 43 mm. We initially recommended cardiac cath. He was seen by Almyra Deforest PA-C one month ago and patient reported improvement in dyspnea at that time. Therefore cardiac cath was not scheduled.   He reports that he was working on his exercise and everything seemed to improve. He was walking 2.5 miles per day and able to go up 2 flights of stairs. He was walking on his treadmill. On October 9 he was walking on his treadmill and felt like he hit the wall. He noted his HR dropped from 100 to 60 and he felt lightheaded and SOB. He had to stop. After 5 minutes rest he was able to resume walking. This happened again while on the treadmill and again HR dropped. Since then he has noticed more dyspnea on exertion and finds it difficult  to go up 2 flights of stairs. No orthopnea, PND, chest pain, or edema. No syncope.    The patient does not have symptoms concerning for COVID-19 infection (fever, chills, cough, or new shortness of breath).    Past Medical History:  Diagnosis Date  . Cervical radiculopathy at C7    PMH of  . Diverticulosis    colonoscopy 10-15-2006  . DVT (deep venous thrombosis)  (HCC)   . GERD (gastroesophageal reflux disease)   . Gilbert syndrome   . Hemorrhoids   . Hyperlipidemia   . Hypothyroidism   . Perennial allergic rhinitis with seasonal variation   . Pulmonary embolism [facility](HCC)    Past Surgical History:  Procedure Laterality Date  . COLONOSCOPY  2010   Tics  . ENDOVENOUS ABLATION SAPHENOUS VEIN W/ LASER Right 08/21/2018   endovenous laser ablation right greater saphenous vein and stab phlebectomy > 20 incisions right leg by Fabienne Brunsharles Fields MD   . WISDOM TOOTH EXTRACTION       Current Meds  Medication Sig  . levothyroxine (SYNTHROID, LEVOTHROID) 125 MCG tablet TAKE 1 TABLET BY MOUTH EVERY MORNING ON an empty stomach  . Multiple Vitamin (MULTIVITAMIN) tablet Take 1 tablet by mouth daily.    . rosuvastatin (CRESTOR) 5 MG tablet Take 5 mg by mouth 3 (three) times a week.     Allergies:   Patient has no known allergies.   Social History   Tobacco Use  . Smoking status: Never Smoker  . Smokeless tobacco: Never Used  Substance Use Topics  . Alcohol use: Yes    Alcohol/week: 2.0 standard drinks    Types: 2 Glasses of wine per week    Comment:  < 3 /week , only socially  . Drug use: No     Family Hx: The patient's family history includes Breast cancer in his mother; Congestive Heart Failure in his father; Coronary artery disease in his father and paternal aunt; Stroke (age of onset: 6089) in his maternal grandmother. There is no history of Diabetes, Hypertension, or Colon cancer.  ROS:   Please see the history of present illness.    All other systems reviewed and are negative.   Prior CV studies:   The following studies were reviewed today:  CTA of chest 02/19/2019 IMPRESSION: 1. No change in caliber of the tubular ascending thoracic aorta, measuring 4.6 x 4.3 cm, not significantly changed in caliber, measuring 4.5 x 4.2 cm on examinations dating back to 03/23/2017. The sinuses of Valsalva measure up to 4.4 cm in caliber. Normal caliber of  the aortic valve. The descending thoracic aorta is normal in caliber. No significant atherosclerosis.  2. Examination is not tailored for the evaluation of pulmonary embolism, however there is no obvious residual pulmonary embolism in the right lower lobe at the site of embolism seen on prior examination dated 09/11/2018.  3. Stable, benign small pulmonary nodules.   Venous doppler 04/10/2019 Summary: Right: Successful vein closure. There is no evidence of deep vein thrombosis in the lower extremity. No cystic structure found in the popliteal fossa. No significant change compared to previous study. Left: No evidence of common femoral vein obstruction.  *See table(s) above for measurements and observations.   Echo 04/24/2019 IMPRESSIONS 1. The left ventricle has hyperdynamic systolic function, with an ejection fraction of >65%. The cavity size was normal. There is severely increased left ventricular wall thickness. Left ventricular diastolic Doppler parameters are consistent with  impaired relaxation. 2. LVOT is narrowed The LV is severely  hypertrophied. There is significant outflow obstruction at mid cavity and through LVOT with peak gradient at least 100 mm Hg. All consistent with HOCM. 3. The right ventricle has normal systolic function. The cavity was normal. There is no increase in right ventricular wall thickness. 4. Left atrial size was mildly dilated. 5. There is systolic anterior motion of mitral leaflets. 6. The aortic valve is tricuspid. Mild thickening of the aortic valve. Aortic valve regurgitation is mild by color flow Doppler. 7. The aorta is abnormal unless otherwise noted. 8. The ascending aorta is normal in size and structure. 9. There is mild dilatation of the ascending aorta measuring 43 mm.   Labs/Other Tests and Data Reviewed:    EKG:  No ECG reviewed.  Recent Labs: No results found for requested labs within last 8760 hours.   Recent Lipid  Panel Lab Results  Component Value Date/Time   CHOL 167 08/24/2016 07:27 AM   CHOL 202 (H) 04/29/2015 07:34 AM   TRIG 108.0 08/24/2016 07:27 AM   TRIG 165 (H) 04/29/2015 07:34 AM   HDL 51.00 08/24/2016 07:27 AM   HDL 47 04/29/2015 07:34 AM   CHOLHDL 3 08/24/2016 07:27 AM   LDLCALC 95 08/24/2016 07:27 AM   LDLCALC 122 (H) 04/29/2015 07:34 AM   LDLDIRECT 78.7 03/28/2013 07:32 AM    Wt Readings from Last 3 Encounters:  06/06/19 205 lb (93 kg)  04/29/19 212 lb (96.2 kg)  04/10/19 212 lb (96.2 kg)     Objective:    Vital Signs:  BP 130/90   Ht 5\' 11"  (1.803 m)   Wt 205 lb (93 kg)   BMI 28.59 kg/m    VITAL SIGNS:  reviewed  General. NAD HEENT normal Respirations unlabored.  Neuro alert and oriented x 3.   ASSESSMENT & PLAN:    1. Hypertrophic cardiomyopathy: Most recent Echo with marked increase in LVOT gradient. Now with progressive symptoms. Intolerant of beta blocker in the past and I am concerned about starting Cardizem given evidence of conduction system disease and especially with bradycardia noted while exercising. I think we need to pursue further evaluation with right and left heart cath. We may also want to consider an event monitor and potentially cardiac MRI. He may need to be considered for either alcohol septal ablation or septal myectomy. Will plan on proceeding with cardiac cath in the next couple of weeks. The procedure and risks were reviewed including but not limited to death, myocardial infarction, stroke, arrythmias, bleeding, transfusion, emergency surgery, dye allergy, or renal dysfunction. The patient voices understanding and is agreeable to proceed.  2. Thoracic aortic aneurysm: Mildly dilated aorta on recent echocardiogram measuring up to 43 mm  3. Hyperlipidemia: Continue Crestor  4. Hypothyroidism: On Synthroid  5. History of PE: Finished 104-month therapy with Xarelto, Xarelto have been discontinued since end of August 2020. Seemed to recover well  from this.    COVID-19 Education: The signs and symptoms of COVID-19 were discussed with the patient and how to seek care for testing (follow up with PCP or arrange E-visit).  The importance of social distancing was discussed today.  Time:   Today, I have spent 20 minutes with the patient with telehealth technology discussing the above problems.     Medication Adjustments/Labs and Tests Ordered: Current medicines are reviewed at length with the patient today.  Concerns regarding medicines are outlined above.   Tests Ordered: No orders of the defined types were placed in this encounter.   Medication Changes:  No orders of the defined types were placed in this encounter.   Follow Up:  TBD   Signed,  [place], MD  06/06/2019 8:25 AM    [facility]

## 2019-06-03 NOTE — Telephone Encounter (Signed)
Spoke to patient he is out of town.He will keep virtual appointment with Dr.Jordan as planned 06/06/19 at 8:00 am.

## 2019-06-06 ENCOUNTER — Encounter: Payer: Self-pay | Admitting: Cardiology

## 2019-06-06 ENCOUNTER — Telehealth (INDEPENDENT_AMBULATORY_CARE_PROVIDER_SITE_OTHER): Payer: BC Managed Care – PPO | Admitting: Cardiology

## 2019-06-06 VITALS — BP 130/90 | Ht 71.0 in | Wt 205.0 lb

## 2019-06-06 DIAGNOSIS — I712 Thoracic aortic aneurysm, without rupture, unspecified: Secondary | ICD-10-CM

## 2019-06-06 DIAGNOSIS — E7800 Pure hypercholesterolemia, unspecified: Secondary | ICD-10-CM

## 2019-06-06 DIAGNOSIS — I422 Other hypertrophic cardiomyopathy: Secondary | ICD-10-CM

## 2019-06-06 DIAGNOSIS — Z86711 Personal history of pulmonary embolism: Secondary | ICD-10-CM

## 2019-06-06 DIAGNOSIS — R0600 Dyspnea, unspecified: Secondary | ICD-10-CM

## 2019-06-06 DIAGNOSIS — R0609 Other forms of dyspnea: Secondary | ICD-10-CM

## 2019-06-09 NOTE — Patient Instructions (Addendum)
Medication Instructions:  Continue same medications *If you need a refill on your cardiac medications before your next appointment, please call your pharmacy*  Lab Work: Bmet,lipid and hepatic panels,Cbc,PT to be done 06/23/19 at [facility] office [facility] at 9:00 am Nothing to eat.You may have water.   Testing/Procedures: Schedule Right and Left Cardiac  Follow instructions below.  Follow-Up: At [facility], you and your health needs are our priority.  As part of our continuing mission to provide you with exceptional heart care, we have created designated Provider Care Teams.  These Care Teams include your primary Cardiologist (physician) and Advanced Practice Providers (APPs -  Physician Assistants and Nurse Practitioners) who all work together to provide you with the care you need, when you need it.  Your next appointment:  Wed 07/09/19 at 2:40 pm         [place] [address] Dept: [phone] Loc: [place]  06/09/2019  You are scheduled for a Right and Left Cardiac Cath on Thursday 06/26/19, with Dr.Jordan.  1. Please arrive at the [facility] (Main Entrance A) at [facility]: [address] [place], [place] [address] at 5:30 am (This time is two hours before your procedure to ensure your preparation). Free valet parking service is available.   Special note: Every effort is made to have your procedure done on time. Please understand that emergencies sometimes delay scheduled procedures.  2. Diet: Do not eat solid foods after midnight.  The patient may have clear liquids until 5am upon the day of the procedure.  3. Labs: You will need to have blood drawn on Monday 06/23/19 at 9:00 am at [facility] office [facility].You will need to be fasting.After lab you will need to have Covid test at 9:40 am at [facility] drive thru Pre  Procedure Line.After Covid test you will need to quarantine until after cath.  4. Medication instructions in preparation for your procedure:     On the morning of your procedure, take your Aspirin 81 mg and any morning medicines NOT listed above.  You may use sips of water.  5. Plan for one night stay--bring personal belongings. 6. Bring a current list of your medications and current insurance cards. 7. You MUST have a responsible person to drive you home. 8. Someone MUST be with you the first 24 hours after you arrive home or your discharge will be delayed. 9. Please wear clothes that are easy to get on and off and wear slip-on shoes.  Thank you for allowing [place] to care for you!   -- [facility]

## 2019-06-09 NOTE — Addendum Note (Signed)
Addended by: Kathyrn Lass on: 06/09/2019 04:11 PM   Modules accepted: Orders

## 2019-06-16 ENCOUNTER — Other Ambulatory Visit: Payer: Self-pay | Admitting: Cardiology

## 2019-06-16 DIAGNOSIS — I421 Obstructive hypertrophic cardiomyopathy: Secondary | ICD-10-CM

## 2019-06-16 MED ORDER — SODIUM CHLORIDE 0.9% FLUSH: 3.0000 mL | Freq: Two times a day (BID) | INTRAVENOUS | Status: DC

## 2019-06-23 ENCOUNTER — Other Ambulatory Visit (HOSPITAL_COMMUNITY)
Admission: RE | Admit: 2019-06-23 | Discharge: 2019-06-23 | Disposition: A | Discharge status: Home / Self Care | Payer: BC Managed Care – PPO | Source: Ambulatory Visit | Attending: Cardiology | Admitting: Cardiology

## 2019-06-23 DIAGNOSIS — Z01812 Encounter for preprocedural laboratory examination: Secondary | ICD-10-CM | POA: Diagnosis not present

## 2019-06-23 DIAGNOSIS — Z86711 Personal history of pulmonary embolism: Secondary | ICD-10-CM | POA: Diagnosis not present

## 2019-06-23 DIAGNOSIS — Z20828 Contact with and (suspected) exposure to other viral communicable diseases: Secondary | ICD-10-CM | POA: Diagnosis not present

## 2019-06-23 DIAGNOSIS — I712 Thoracic aortic aneurysm, without rupture: Secondary | ICD-10-CM | POA: Diagnosis not present

## 2019-06-23 DIAGNOSIS — E7800 Pure hypercholesterolemia, unspecified: Secondary | ICD-10-CM | POA: Diagnosis not present

## 2019-06-23 DIAGNOSIS — I422 Other hypertrophic cardiomyopathy: Secondary | ICD-10-CM | POA: Diagnosis not present

## 2019-06-24 ENCOUNTER — Telehealth: Payer: Self-pay | Admitting: *Deleted

## 2019-06-24 LAB — BASIC METABOLIC PANEL
BUN/Creatinine Ratio: 15 (ref 10–24)
BUN: 18 mg/dL (ref 8–27)
CO2: 22 mmol/L (ref 20–29)
Calcium: 9.2 mg/dL (ref 8.6–10.2)
Chloride: 101 mmol/L (ref 96–106)
Creatinine, Ser: 1.24 mg/dL (ref 0.76–1.27)
GFR calc Af Amer: 66 mL/min/{1.73_m2} (ref >59)
GFR calc non Af Amer: 57 mL/min/{1.73_m2} — ABNORMAL LOW (ref >59)
Glucose: 82 mg/dL (ref 65–99)
Potassium: 4.5 mmol/L (ref 3.5–5.2)
Sodium: 137 mmol/L (ref 134–144)

## 2019-06-24 LAB — PT AND PTT
INR: 1 (ref 0.9–1.2)
Prothrombin Time: 11.1 s (ref 9.1–12.0)
aPTT: 29 s (ref 24–33)

## 2019-06-24 LAB — CBC WITH DIFFERENTIAL/PLATELET
Basophils Absolute: 0 10*3/uL (ref 0.0–0.2)
Basos: 0 %
EOS (ABSOLUTE): 0.3 10*3/uL (ref 0.0–0.4)
Eos: 4 %
Hematocrit: 47.1 % (ref 37.5–51.0)
Hemoglobin: 16.1 g/dL (ref 13.0–17.7)
Immature Grans (Abs): 0 10*3/uL (ref 0.0–0.1)
Immature Granulocytes: 0 %
Lymphocytes Absolute: 1.9 10*3/uL (ref 0.7–3.1)
Lymphs: 25 %
MCH: 31.4 pg (ref 26.6–33.0)
MCHC: 34.2 g/dL (ref 31.5–35.7)
MCV: 92 fL (ref 79–97)
Monocytes Absolute: 0.8 10*3/uL (ref 0.1–0.9)
Monocytes: 10 %
Neutrophils Absolute: 4.5 10*3/uL (ref 1.4–7.0)
Neutrophils: 61 %
Platelets: 208 10*3/uL (ref 150–450)
RBC: 5.13 x10E6/uL (ref 4.14–5.80)
RDW: 12.4 % (ref 11.6–15.4)
WBC: 7.4 10*3/uL (ref 3.4–10.8)

## 2019-06-24 LAB — LIPID PANEL
Chol/HDL Ratio: 3.6 ratio (ref 0.0–5.0)
Cholesterol, Total: 182 mg/dL (ref 100–199)
HDL: 51 mg/dL (ref >39)
LDL Chol Calc (NIH): 100 mg/dL — ABNORMAL HIGH (ref 0–99)
Triglycerides: 180 mg/dL — ABNORMAL HIGH (ref 0–149)
VLDL Cholesterol Cal: 31 mg/dL (ref 5–40)

## 2019-06-24 LAB — HEPATIC FUNCTION PANEL
ALT: 19 IU/L (ref 0–44)
AST: 19 IU/L (ref 0–40)
Albumin: 4.4 g/dL (ref 3.7–4.7)
Alkaline Phosphatase: 69 IU/L (ref 39–117)
Bilirubin Total: 0.5 mg/dL (ref 0.0–1.2)
Bilirubin, Direct: 0.14 mg/dL (ref 0.00–0.40)
Total Protein: 7.1 g/dL (ref 6.0–8.5)

## 2019-06-24 LAB — NOVEL CORONAVIRUS, NAA (HOSP ORDER, SEND-OUT TO REF LAB; TAT 18-24 HRS): SARS-CoV-2, NAA: NOT DETECTED

## 2019-06-24 NOTE — Telephone Encounter (Signed)
Pt contacted pre-catheterization scheduled at [facility] for: Thursday June 26, 2019 7:30 AM Verified arrival time and place: [facility]) at: 5:30 AM   No solid food after midnight prior to cath, clear liquids until 5 AM day of procedure. Contrast allergy: no  AM meds can be  taken pre-cath with sip of water including: ASA 81 mg   Confirmed patient has responsible adult to drive home post procedure and observe 24 hours after arriving home: yes  Currently, due to Covid-19 pandemic, only one support person will be allowed with patient. Must be the same support person for that patient's entire stay, will be screened and required to wear a mask. They will be asked to wait in the waiting room for the duration of the patient's stay.  Patients are required to wear a mask when they enter the hospital.      COVID-19 Pre-Screening Questions:  . In the past 7 to 10 days have you had a cough,  shortness of breath, headache, congestion, fever (100 or greater) body aches, chills, sore throat, or sudden loss of taste or sense of smell? no . Have you been around anyone with known Covid 19? no . Have you been around anyone who is awaiting Covid 19 test results in the past 7 to 10 days? no . Have you been around anyone who has been exposed to Covid 19, or has mentioned symptoms of Covid 19 within the past 7 to 10 days? no  I reviewed procedure/mask/visitor instructions, Covid-19 screening questions with patient, he verbalized understanding, thanked me for call.

## 2019-06-26 ENCOUNTER — Ambulatory Visit (HOSPITAL_COMMUNITY)
Admission: RE | Admit: 2019-06-26 | Discharge: 2019-06-26 | Disposition: A | Discharge status: Home / Self Care | Payer: BC Managed Care – PPO | Attending: Cardiology | Admitting: Cardiology

## 2019-06-26 ENCOUNTER — Encounter (HOSPITAL_COMMUNITY)
Admission: RE | Disposition: A | Discharge status: Home / Self Care | Payer: Self-pay | Source: Home / Self Care | Attending: Cardiology

## 2019-06-26 ENCOUNTER — Other Ambulatory Visit: Payer: Self-pay | Admitting: Student

## 2019-06-26 DIAGNOSIS — Z79899 Other long term (current) drug therapy: Secondary | ICD-10-CM | POA: Diagnosis not present

## 2019-06-26 DIAGNOSIS — E785 Hyperlipidemia, unspecified: Secondary | ICD-10-CM | POA: Diagnosis not present

## 2019-06-26 DIAGNOSIS — I421 Obstructive hypertrophic cardiomyopathy: Secondary | ICD-10-CM | POA: Diagnosis present

## 2019-06-26 DIAGNOSIS — I712 Thoracic aortic aneurysm, without rupture, unspecified: Secondary | ICD-10-CM | POA: Diagnosis present

## 2019-06-26 DIAGNOSIS — Z79890 Hormone replacement therapy (postmenopausal): Secondary | ICD-10-CM | POA: Insufficient documentation

## 2019-06-26 DIAGNOSIS — Z86711 Personal history of pulmonary embolism: Secondary | ICD-10-CM | POA: Insufficient documentation

## 2019-06-26 DIAGNOSIS — K219 Gastro-esophageal reflux disease without esophagitis: Secondary | ICD-10-CM | POA: Insufficient documentation

## 2019-06-26 DIAGNOSIS — E039 Hypothyroidism, unspecified: Secondary | ICD-10-CM | POA: Diagnosis not present

## 2019-06-26 HISTORY — PX: RIGHT/LEFT HEART CATH AND CORONARY ANGIOGRAPHY: CATH118266

## 2019-06-26 LAB — POCT I-STAT EG7
Acid-base deficit: 1 mmol/L (ref 0.0–2.0)
Bicarbonate: 25.1 mmol/L (ref 20.0–28.0)
Calcium, Ion: 1.25 mmol/L (ref 1.15–1.40)
HCT: 42 % (ref 39.0–52.0)
Hemoglobin: 14.3 g/dL (ref 13.0–17.0)
O2 Saturation: 75 %
Potassium: 4.2 mmol/L (ref 3.5–5.1)
Sodium: 140 mmol/L (ref 135–145)
TCO2: 27 mmol/L (ref 22–32)
pCO2, Ven: 47.5 mmHg (ref 44.0–60.0)
pH, Ven: 7.332 (ref 7.250–7.430)
pO2, Ven: 43 mmHg (ref 32.0–45.0)

## 2019-06-26 LAB — POCT I-STAT 7, (LYTES, BLD GAS, ICA,H+H)
Acid-base deficit: 2 mmol/L (ref 0.0–2.0)
Bicarbonate: 24 mmol/L (ref 20.0–28.0)
Calcium, Ion: 1.21 mmol/L (ref 1.15–1.40)
HCT: 41 % (ref 39.0–52.0)
Hemoglobin: 13.9 g/dL (ref 13.0–17.0)
O2 Saturation: 98 %
Potassium: 4.1 mmol/L (ref 3.5–5.1)
Sodium: 141 mmol/L (ref 135–145)
TCO2: 25 mmol/L (ref 22–32)
pCO2 arterial: 42.8 mmHg (ref 32.0–48.0)
pH, Arterial: 7.357 (ref 7.350–7.450)
pO2, Arterial: 111 mmHg — ABNORMAL HIGH (ref 83.0–108.0)

## 2019-06-26 SURGERY — RIGHT/LEFT HEART CATH AND CORONARY ANGIOGRAPHY
Anesthesia: LOCAL

## 2019-06-26 MED ORDER — HEPARIN SODIUM (PORCINE) 1000 UNIT/ML IJ SOLN
INTRAMUSCULAR | Status: AC
  Filled 2019-06-26: qty 1

## 2019-06-26 MED ORDER — MIDAZOLAM HCL 2 MG/2ML IJ SOLN
INTRAMUSCULAR | Status: AC
  Filled 2019-06-26: qty 2

## 2019-06-26 MED ORDER — ONDANSETRON HCL 4 MG/2ML IJ SOLN: 4.0000 mg | Freq: Four times a day (QID) | INTRAMUSCULAR | Status: DC | PRN

## 2019-06-26 MED ORDER — HEPARIN (PORCINE) IN NACL 1000-0.9 UT/500ML-% IV SOLN
INTRAVENOUS | Status: DC | PRN
  Administered 2019-06-26 (×2): 500 mL

## 2019-06-26 MED ORDER — SODIUM CHLORIDE 0.9% FLUSH: 3.0000 mL | INTRAVENOUS | Status: DC | PRN

## 2019-06-26 MED ORDER — HEPARIN SODIUM (PORCINE) 1000 UNIT/ML IJ SOLN
INTRAMUSCULAR | Status: DC | PRN
  Administered 2019-06-26: 4500 [IU] via INTRAVENOUS

## 2019-06-26 MED ORDER — ASPIRIN 81 MG PO CHEW: 81.0000 mg | CHEWABLE_TABLET | ORAL | Status: DC

## 2019-06-26 MED ORDER — SODIUM CHLORIDE 0.9 % WEIGHT BASED INFUSION: 1.0000 mL/kg/h | INTRAVENOUS | Status: DC

## 2019-06-26 MED ORDER — SODIUM CHLORIDE 0.9% FLUSH: 3.0000 mL | Freq: Two times a day (BID) | INTRAVENOUS | Status: DC

## 2019-06-26 MED ORDER — FENTANYL CITRATE (PF) 100 MCG/2ML IJ SOLN
INTRAMUSCULAR | Status: AC
  Filled 2019-06-26: qty 2

## 2019-06-26 MED ORDER — VERAPAMIL HCL 2.5 MG/ML IV SOLN
INTRAVENOUS | Status: AC
  Filled 2019-06-26: qty 2

## 2019-06-26 MED ORDER — ACETAMINOPHEN 325 MG PO TABS: 650.0000 mg | ORAL_TABLET | ORAL | Status: DC | PRN

## 2019-06-26 MED ORDER — LIDOCAINE HCL (PF) 1 % IJ SOLN
INTRAMUSCULAR | Status: DC | PRN
  Administered 2019-06-26 (×2): 2 mL

## 2019-06-26 MED ORDER — MIDAZOLAM HCL 2 MG/2ML IJ SOLN
INTRAMUSCULAR | Status: DC | PRN
  Administered 2019-06-26: 1 mg via INTRAVENOUS

## 2019-06-26 MED ORDER — VERAPAMIL HCL 2.5 MG/ML IV SOLN
INTRAVENOUS | Status: DC | PRN
  Administered 2019-06-26: 10 mL via INTRA_ARTERIAL

## 2019-06-26 MED ORDER — FENTANYL CITRATE (PF) 100 MCG/2ML IJ SOLN
INTRAMUSCULAR | Status: DC | PRN
  Administered 2019-06-26: 25 ug via INTRAVENOUS

## 2019-06-26 MED ORDER — SODIUM CHLORIDE 0.9 % IV SOLN: 250.0000 mL | INTRAVENOUS | Status: DC | PRN

## 2019-06-26 MED ORDER — IOHEXOL 350 MG/ML SOLN
INTRAVENOUS | Status: DC | PRN
  Administered 2019-06-26: 90 mL via INTRACARDIAC

## 2019-06-26 MED ORDER — HEPARIN (PORCINE) IN NACL 1000-0.9 UT/500ML-% IV SOLN
INTRAVENOUS | Status: AC
  Filled 2019-06-26: qty 1000

## 2019-06-26 MED ORDER — SODIUM CHLORIDE 0.9 % WEIGHT BASED INFUSION
3.0000 mL/kg/h | INTRAVENOUS | Status: AC
  Administered 2019-06-26: 3 mL/kg/h via INTRAVENOUS

## 2019-06-26 MED ORDER — LIDOCAINE HCL (PF) 1 % IJ SOLN
INTRAMUSCULAR | Status: AC
  Filled 2019-06-26: qty 30

## 2019-06-26 SURGICAL SUPPLY — 14 items
CATH 5FR JL3.5 JR4 ANG PIG MP (CATHETERS) ×2 IMPLANT
CATH BALLN WEDGE 5F 110CM (CATHETERS) ×2 IMPLANT
CATH LAUNCHER 5F RADR (CATHETERS) ×1 IMPLANT
CATHETER LAUNCHER 5F RADR (CATHETERS) ×2
DEVICE RAD COMP TR BAND LRG (VASCULAR PRODUCTS) ×2 IMPLANT
GLIDESHEATH SLEND SS 6F .021 (SHEATH) ×2 IMPLANT
GUIDEWIRE INQWIRE 1.5J.035X260 (WIRE) ×1 IMPLANT
INQWIRE 1.5J .035X260CM (WIRE) ×2
KIT HEART LEFT (KITS) ×2 IMPLANT
PACK CARDIAC CATHETERIZATION (CUSTOM PROCEDURE TRAY) ×2 IMPLANT
SHEATH GLIDE SLENDER 4/5FR (SHEATH) ×2 IMPLANT
SYR MEDRAD MARK 7 150ML (SYRINGE) ×2 IMPLANT
TRANSDUCER W/STOPCOCK (MISCELLANEOUS) ×2 IMPLANT
TUBING CIL FLEX 10 FLL-RA (TUBING) ×2 IMPLANT

## 2019-06-26 NOTE — Discharge Instructions (Addendum)
Radial Site Care  This sheet gives you information about how to care for yourself after your procedure. Your health care provider may also give you more specific instructions. If you have problems or questions, contact your health care provider. What can I expect after the procedure? After the procedure, it is common to have:  Bruising and tenderness at the catheter insertion area. Follow these instructions at home: Medicines  Take over-the-counter and prescription medicines only as told by your health care provider. Insertion site care  Follow instructions from your health care provider about how to take care of your insertion site. Make sure you: ? Wash your hands with soap and water before you change your bandage (dressing). If soap and water are not available, use hand sanitizer. ? Change your dressing as told by your health care provider. ? Leave stitches (sutures), skin glue, or adhesive strips in place. These skin closures may need to stay in place for 2 weeks or longer. If adhesive strip edges start to loosen and curl up, you may trim the loose edges. Do not remove adhesive strips completely unless your health care provider tells you to do that.  Check your insertion site every day for signs of infection. Check for: ? Redness, swelling, or pain. ? Fluid or blood. ? Pus or a bad smell. ? Warmth.  Do not take baths, swim, or use a hot tub until your health care provider approves.  You may shower 24-48 hours after the procedure, or as directed by your health care provider. ? Remove the dressing and gently wash the site with plain soap and water. ? Pat the area dry with a clean towel. ? Do not rub the site. That could cause bleeding.  Do not apply powder or lotion to the site. Activity   For 24 hours after the procedure, or as directed by your health care provider: ? Do not flex or bend the affected arm. ? Do not push or pull heavy objects with the affected arm. ? Do not  drive yourself home from the hospital or clinic. You may drive 24 hours after the procedure unless your health care provider tells you not to. ? Do not operate machinery or power tools.  Do not lift anything that is heavier than 10 lb (4.5 kg), or the limit that you are told, until your health care provider says that it is safe.  Ask your health care provider when it is okay to: ? Return to work or school. ? Resume usual physical activities or sports. ? Resume sexual activity. General instructions  If the catheter site starts to bleed, raise your arm and put firm pressure on the site. If the bleeding does not stop, get help right away. This is a medical emergency.  If you went home on the same day as your procedure, a responsible adult should be with you for the first 24 hours after you arrive home.  Keep all follow-up visits as told by your health care provider. This is important. Contact a health care provider if:  You have a fever.  You have redness, swelling, or yellow drainage around your insertion site. Get help right away if:  You have unusual pain at the radial site.  The catheter insertion area swells very fast.  The insertion area is bleeding, and the bleeding does not stop when you hold steady pressure on the area.  Your arm or hand becomes pale, cool, tingly, or numb. These symptoms may represent a serious problem   that is an emergency. Do not wait to see if the symptoms will go away. Get medical help right away. Call your local emergency services (911 in the [place] Do not drive yourself to the hospital. Summary  After the procedure, it is common to have bruising and tenderness at the site.  Follow instructions from your health care provider about how to take care of your radial site wound. Check the wound every day for signs of infection.  Do not lift anything that is heavier than 10 lb (4.5 kg), or the limit that you are told, until your health care provider says  that it is safe. This information is not intended to replace advice given to you by your health care provider. Make sure you discuss any questions you have with your health care provider. Document Released: 09/02/2010 Document Revised: 09/05/2017 Document Reviewed: 09/05/2017 Elsevier Patient Education  2020 Elsevier Inc.  Moderate Conscious Sedation, Adult, Care After These instructions provide you with information about caring for yourself after your procedure. Your health care provider may also give you more specific instructions. Your treatment has been planned according to current medical practices, but problems sometimes occur. Call your health care provider if you have any problems or questions after your procedure. What can I expect after the procedure? After your procedure, it is common:  To feel sleepy for several hours.  To feel clumsy and have poor balance for several hours.  To have poor judgment for several hours.  To vomit if you eat too soon. Follow these instructions at home: For at least 24 hours after the procedure:   Do not: ? Participate in activities where you could fall or become injured. ? Drive. ? Use heavy machinery. ? Drink alcohol. ? Take sleeping pills or medicines that cause drowsiness. ? Make important decisions or sign legal documents. ? Take care of children on your own.  Rest. Eating and drinking  Follow the diet recommended by your health care provider.  If you vomit: ? Drink water, juice, or soup when you can drink without vomiting. ? Make sure you have little or no nausea before eating solid foods. General instructions  Have a responsible adult stay with you until you are awake and alert.  Take over-the-counter and prescription medicines only as told by your health care provider.  If you smoke, do not smoke without supervision.  Keep all follow-up visits as told by your health care provider. This is important. Contact a health care  provider if:  You keep feeling nauseous or you keep vomiting.  You feel light-headed.  You develop a rash.  You have a fever. Get help right away if:  You have trouble breathing. This information is not intended to replace advice given to you by your health care provider. Make sure you discuss any questions you have with your health care provider. Document Released: 05/21/2013 Document Revised: 07/13/2017 Document Reviewed: 11/20/2015 Elsevier Patient Education  2020 Elsevier Inc.  

## 2019-06-26 NOTE — Progress Notes (Signed)
Ambulated to bathroom to void tol well  

## 2019-06-26 NOTE — Progress Notes (Signed)
Placed order for outpatient MRI and 2 week Event Monitor for further work-up of suspected HOCM at the request of Dr. Martinique.

## 2019-06-26 NOTE — Interval H&P Note (Signed)
History and Physical Interval Note:  06/26/2019 7:23 AM  Chase Hill  has presented today for surgery, with the diagnosis of cardiomyopathy.  The various methods of treatment have been discussed with the patient and family. After consideration of risks, benefits and other options for treatment, the patient has consented to  Procedure(s): RIGHT/LEFT HEART CATH AND CORONARY ANGIOGRAPHY (N/A) as a surgical intervention.  The patient's history has been reviewed, patient examined, no change in status, stable for surgery.  I have reviewed the patient's chart and labs.  Questions were answered to the patient's satisfaction.     Collier Salina [facility] 06/26/2019 7:23 AM

## 2019-06-27 ENCOUNTER — Encounter (HOSPITAL_COMMUNITY): Payer: Self-pay | Admitting: Cardiology

## 2019-06-30 ENCOUNTER — Encounter: Payer: Self-pay | Admitting: Cardiology

## 2019-06-30 ENCOUNTER — Telehealth: Payer: Self-pay | Admitting: *Deleted

## 2019-06-30 NOTE — Telephone Encounter (Signed)
Left message regarding appointment scheduled for cardiac mri on 07/22/19 at 12:00pm at [facility]---arrival time is 11:15 am 1st floor admissions office --will mail information to patient and it is also available in My Chart

## 2019-07-06 DIAGNOSIS — J0110 Acute frontal sinusitis, unspecified: Secondary | ICD-10-CM | POA: Diagnosis not present

## 2019-07-06 NOTE — Progress Notes (Signed)
Date:  07/06/2019   ID:  Chase Hill, DOB 1945-09-30, MRN [id number]  PCP:  [name], MD  Cardiologist:  Logen Fowle [place], MD  Electrophysiologist:  None   Evaluation Performed:  Follow-Up Visit post cardiac cath  Chief Complaint:  HOCM  History of Present Illness:    Chase Hill is a 73 y.o. male with PMH of hypertrophic cardiomyopathy, history of DVT/PE, TAA, hyperlipidemia, RBBB, Gilbert's syndrome, hypothyroidism.  He was previously followed by Dr. Shirlee Latch.  Stress Myoview in 2011 showed inferior and apical ischemia.  Subsequent cardiac cath showed no obstructive disease.  In 2014, echocardiogram demonstrated basal septal hypertrophy with mild LV outflow tract gradient and chordal versus valvular SAM, moderate diastolic dysfunction.  Cardiac MRI obtained in June 2014 showed mild focal basal septal hypertrophy with systolic anterior motion of the mitral valve and mild MR.  There was no significant delayed enhancement.  He has been intolerant of pravastatin due to myalgia.  He also developed  fatigue and orthostasis on beta-blocker.  Due to increased septal hypertrophy and outflow gradient on previous echo in July 2018, he was started on Cardizem.  This was later discontinued due to concern of conduction disease with first-degree AV block, left anterior fascicular block and a right bundle branch block. He later underwent laser ablation of varicose veins by Dr. Darrick Penna. He was diagnosed with PE on CT angiogram of the chest in January 2029 and was placed on Xarelto.  PE was felt to be provoked by laser vein treatment.  He completed 6 months of therapy with Xarelto and switch to aspirin.  CT angiogram of the chest recently showed 4.6 cm thoracic aortic aneurysm.  Recent lower extremity venous Doppler obtained on 04/10/2019 was negative for DVT.  Repeat echocardiogram obtained on 04/24/2019, EF was greater than 65%, severe LVH, significant outflow obstruction at the mid cavity and   LVOT with peak gradient of at least 100 mmHg, systolic anterior motion of mitral leaflet, mild dilatation of the ascending aorta measuring 43 mm. We initially recommended cardiac cath. He was seen by Azalee Course PA-C one month ago and patient reported improvement in dyspnea at that time. Therefore cardiac cath was not scheduled.   He reports that he was working on his exercise and everything seemed to improve. He was walking 2.5 miles per day and able to go up 2 flights of stairs. He was walking on his treadmill. On October 9 he was walking on his treadmill and felt like he hit the wall. He noted his HR dropped from 100 to 60 and he felt lightheaded and SOB. He had to stop. After 5 minutes rest he was able to resume walking. This happened again while on the treadmill and again HR dropped. Since then he has noticed more dyspnea on exertion and finds it difficult to go up 2 flights of stairs. No orthopnea, PND, chest pain, or edema. No syncope.   We did proceed with cardiac cath on 06/26/19. This showed normal coronary anatomy. Normal LV and right heart pressures. LVOT gradient 20 mm Hg by cath. Since then he hasn't really pushed himself and hasn't noticed any symptoms.    Past Medical History:  Diagnosis Date   Cervical radiculopathy at C[facility]    PMH of   Diverticulosis    colonoscopy 10-15-2006   DVT (deep venous thrombosis) (HCC)    GERD (gastroesophageal reflux disease)    Sullivan Lone syndrome    Hemorrhoids    Hyperlipidemia    Hypothyroidism  Perennial allergic rhinitis with seasonal variation    Pulmonary embolism [facility])    Past Surgical History:  Procedure Laterality Date   COLONOSCOPY  2010   Tics   ENDOVENOUS ABLATION SAPHENOUS VEIN W/ LASER Right 08/21/2018   endovenous laser ablation right greater saphenous vein and stab phlebectomy > 20 incisions right leg by Fabienne Bruns MD    RIGHT/LEFT HEART CATH AND CORONARY ANGIOGRAPHY N/A 06/26/2019   Procedure: RIGHT/LEFT HEART CATH  AND CORONARY ANGIOGRAPHY;  Surgeon: [place], Shaneika Rossa M, MD;  Location: [facility] INVASIVE CV LAB;  Service: Cardiovascular;  Laterality: N/A;   WISDOM TOOTH EXTRACTION       No outpatient medications have been marked as taking for the 07/09/19 encounter (Appointment) with [place], Kelsie Zaborowski M, MD.   Current Facility-Administered Medications for the 07/09/19 encounter (Appointment) with [place], Jacqueline Spofford M, MD  Medication   sodium chloride flush (NS) 0.9 % injection 3 mL     Allergies:   Patient has no known allergies.   Social History   Tobacco Use   Smoking status: Never Smoker   Smokeless tobacco: Never Used  Substance Use Topics   Alcohol use: Yes    Alcohol/week: 2.0 standard drinks    Types: 2 Glasses of wine per week    Comment:  < 3 /week , only socially   Drug use: No     Family Hx: The patient's family history includes Breast cancer in his mother; Congestive Heart Failure in his father; Coronary artery disease in his father and paternal aunt; Stroke (age of onset: 67) in his maternal grandmother. There is no history of Diabetes, Hypertension, or Colon cancer.  ROS:   Please see the history of present illness.    All other systems reviewed and are negative.   Prior CV studies:   The following studies were reviewed today:  CTA of chest 02/19/2019 IMPRESSION: 1. No change in caliber of the tubular ascending thoracic aorta, measuring 4.6 x 4.3 cm, not significantly changed in caliber, measuring 4.5 x 4.2 cm on examinations dating back to 03/23/2017. The sinuses of Valsalva measure up to 4.4 cm in caliber. Normal caliber of the aortic valve. The descending thoracic aorta is normal in caliber. No significant atherosclerosis.  2. Examination is not tailored for the evaluation of pulmonary embolism, however there is no obvious residual pulmonary embolism in the right lower lobe at the site of embolism seen on prior examination dated 09/11/2018.  3. Stable, benign small  pulmonary nodules.   Venous doppler 04/10/2019 Summary: Right: Successful vein closure. There is no evidence of deep vein thrombosis in the lower extremity. No cystic structure found in the popliteal fossa. No significant change compared to previous study. Left: No evidence of common femoral vein obstruction.  *See table(s) above for measurements and observations.   Echo 04/24/2019 IMPRESSIONS 1. The left ventricle has hyperdynamic systolic function, with an ejection fraction of >65%. The cavity size was normal. There is severely increased left ventricular wall thickness. Left ventricular diastolic Doppler parameters are consistent with  impaired relaxation. 2. LVOT is narrowed The LV is severely hypertrophied. There is significant outflow obstruction at mid cavity and through LVOT with peak gradient at least 100 mm Hg. All consistent with HOCM. 3. The right ventricle has normal systolic function. The cavity was normal. There is no increase in right ventricular wall thickness. 4. Left atrial size was mildly dilated. 5. There is systolic anterior motion of mitral leaflets. 6. The aortic valve is tricuspid. Mild thickening of the  aortic valve. Aortic valve regurgitation is mild by color flow Doppler. 7. The aorta is abnormal unless otherwise noted. 8. The ascending aorta is normal in size and structure. 9. There is mild dilatation of the ascending aorta measuring 43 mm.   Cardiac cath 06/26/19:  RIGHT/LEFT HEART CATH AND CORONARY ANGIOGRAPHY  Conclusion    The left ventricular systolic function is normal.  LV end diastolic pressure is normal.  The left ventricular ejection fraction is 55-65% by visual estimate.   1. Normal coronary anatomy 2. Normal LV function' 3. 20 mm Hg LVOT gradient at rest. Typical Brockenbrough sign post PVC. 4. Normal/low LV filling pressures 5. Normal right  Heart pressures 6. Normal cardiac output 7. Moderate MR but post PVC  Plan:  recommend further evaluation with cardiac MRI and event monitor.       Labs/Other Tests and Data Reviewed:    EKG:  No ECG reviewed.  Recent Labs: 06/23/2019: ALT 19; BUN 18; Creatinine, Ser 1.24; Platelets 208 06/26/2019: Hemoglobin 14.3; Potassium 4.2; Sodium 140   Recent Lipid Panel Lab Results  Component Value Date/Time   CHOL 182 06/23/2019 09:19 AM   CHOL 202 (H) 04/29/2015 07:34 AM   TRIG 180 (H) 06/23/2019 09:19 AM   TRIG 165 (H) 04/29/2015 07:34 AM   HDL 51 06/23/2019 09:19 AM   HDL 47 04/29/2015 07:34 AM   CHOLHDL 3.6 06/23/2019 09:19 AM   CHOLHDL 3 08/24/2016 07:27 AM   LDLCALC 100 (H) 06/23/2019 09:19 AM   LDLCALC 122 (H) 04/29/2015 07:34 AM   LDLDIRECT 78.7 03/28/2013 07:32 AM    Wt Readings from Last 3 Encounters:  06/26/19 205 lb (93 kg)  06/06/19 205 lb (93 kg)  04/29/19 212 lb (96.2 kg)     Objective:    Vital Signs:  There were no vitals taken for this visit.   VITAL SIGNS:  reviewed  General. NAD HEENT normal Respirations unlabored.  Neuro alert and oriented x 3.   ASSESSMENT & PLAN:    1. Hypertrophic cardiomyopathy: Most recent Echo with marked increase in LVOT gradient 100 mm Hg. Symptom of dyspnea on exertion and dizziness with exertion.  Intolerant of beta blocker in the past due to dizziness and I am concerned about starting Cardizem given evidence of conduction system disease and especially with bradycardia noted while exercising. Cardiac cath done showing normal coronary anatomy. LVOT gradient lower than expected based on Echo. I did review Echo with Dr Shirlee LatchMclean. Recommend an event monitor and  cardiac MRI to evaluate further.   2. Thoracic aortic aneurysm: Mildly dilated aorta on recent echocardiogram measuring up to 43 mm. Follow up yearly.   3. Hyperlipidemia: Continue Crestor  4. Hypothyroidism: On Synthroid  5. History of PE: Finished 4142-month therapy with Xarelto, Xarelto have been discontinued since end of August 2020. Seemed  to recover well from this.    COVID-19 Education: The signs and symptoms of COVID-19 were discussed with the patient and how to seek care for testing (follow up with PCP or arrange E-visit).  The importance of social distancing was discussed today.  Time:   Today, I have spent 20 minutes with the patient with telehealth technology discussing the above problems.     Medication Adjustments/Labs and Tests Ordered: Current medicines are reviewed at length with the patient today.  Concerns regarding medicines are outlined above.   Tests Ordered: No orders of the defined types were placed in this encounter.   Medication Changes: No orders of the defined types  were placed in this encounter.   Follow Up:  TBD   Signed, Patrick Salemi Martinique, MD  07/06/2019 7:55 AM    [facility]

## 2019-07-09 ENCOUNTER — Telehealth: Payer: Self-pay | Admitting: *Deleted

## 2019-07-09 ENCOUNTER — Ambulatory Visit (INDEPENDENT_AMBULATORY_CARE_PROVIDER_SITE_OTHER): Payer: BC Managed Care – PPO | Admitting: Cardiology

## 2019-07-09 ENCOUNTER — Other Ambulatory Visit: Payer: Self-pay

## 2019-07-09 ENCOUNTER — Encounter: Payer: Self-pay | Admitting: Cardiology

## 2019-07-09 VITALS — BP 129/80 | HR 72 | Ht 71.0 in | Wt 214.8 lb

## 2019-07-09 DIAGNOSIS — I422 Other hypertrophic cardiomyopathy: Secondary | ICD-10-CM

## 2019-07-09 DIAGNOSIS — R0609 Other forms of dyspnea: Secondary | ICD-10-CM

## 2019-07-09 DIAGNOSIS — I712 Thoracic aortic aneurysm, without rupture, unspecified: Secondary | ICD-10-CM

## 2019-07-09 DIAGNOSIS — E7800 Pure hypercholesterolemia, unspecified: Secondary | ICD-10-CM | POA: Diagnosis not present

## 2019-07-09 DIAGNOSIS — R0600 Dyspnea, unspecified: Secondary | ICD-10-CM | POA: Diagnosis not present

## 2019-07-09 NOTE — Telephone Encounter (Signed)
[employer] to ship a 14 day cardiac event monitor to the patients home.  Instructions included in the monitor kit. 

## 2019-07-21 ENCOUNTER — Encounter (HOSPITAL_COMMUNITY): Payer: Self-pay

## 2019-07-21 ENCOUNTER — Telehealth (HOSPITAL_COMMUNITY): Payer: Self-pay | Admitting: Emergency Medicine

## 2019-07-21 NOTE — Telephone Encounter (Signed)
Left message on voicemail with name and callback number Marchia Bond RN Navigator Cardiac [facility] [phone] Office [phone] Cell  [facility] message read by patient)

## 2019-07-22 ENCOUNTER — Ambulatory Visit (INDEPENDENT_AMBULATORY_CARE_PROVIDER_SITE_OTHER): Payer: BC Managed Care – PPO

## 2019-07-22 ENCOUNTER — Other Ambulatory Visit: Payer: Self-pay

## 2019-07-22 ENCOUNTER — Ambulatory Visit (HOSPITAL_COMMUNITY)
Admission: RE | Admit: 2019-07-22 | Discharge: 2019-07-22 | Disposition: A | Discharge status: Home / Self Care | Payer: BC Managed Care – PPO | Source: Ambulatory Visit | Attending: Student | Admitting: Student

## 2019-07-22 DIAGNOSIS — R42 Dizziness and giddiness: Secondary | ICD-10-CM | POA: Diagnosis not present

## 2019-07-22 DIAGNOSIS — R0600 Dyspnea, unspecified: Secondary | ICD-10-CM

## 2019-07-22 DIAGNOSIS — I421 Obstructive hypertrophic cardiomyopathy: Secondary | ICD-10-CM

## 2019-07-22 MED ORDER — GADOBUTROL 1 MMOL/ML IV SOLN
10.0000 mL | Freq: Once | INTRAVENOUS | Status: AC | PRN
  Administered 2019-07-22: 10 mL via INTRAVENOUS

## 2019-07-31 ENCOUNTER — Ambulatory Visit: Payer: BC Managed Care – PPO | Admitting: Cardiology

## 2019-08-12 DIAGNOSIS — Z125 Encounter for screening for malignant neoplasm of prostate: Secondary | ICD-10-CM | POA: Diagnosis not present

## 2019-08-12 DIAGNOSIS — E7849 Other hyperlipidemia: Secondary | ICD-10-CM | POA: Diagnosis not present

## 2019-08-12 DIAGNOSIS — E038 Other specified hypothyroidism: Secondary | ICD-10-CM | POA: Diagnosis not present

## 2019-08-14 NOTE — Progress Notes (Signed)
Date:  08/19/2019   ID:  Chase Hill, DOB [birth year], MRN [id number]  PCP:  Chase Matin, Hill  Cardiologist:  [name], Hill  Electrophysiologist:  None   Evaluation Performed:  Follow-Up Visit   Chief Complaint:  HOCM  History of Present Illness:    Chase Hill is a 73 y.o. male with PMH of hypertrophic cardiomyopathy, history of DVT/PE, TAA, hyperlipidemia, RBBB, Gilbert's syndrome, hypothyroidism.  He was previously followed by Dr. Shirlee Hill.  Stress Myoview in 2011 showed inferior and apical ischemia.  Subsequent cardiac cath showed no obstructive disease.  In 2014, echocardiogram demonstrated basal septal hypertrophy with mild LV outflow tract gradient and chordal versus valvular SAM, moderate diastolic dysfunction.  Cardiac MRI obtained in June 2014 showed mild focal basal septal hypertrophy with systolic anterior motion of the mitral valve and mild MR.  There was no significant delayed enhancement.  He has been intolerant of pravastatin due to myalgia.  He also developed  fatigue and orthostasis on beta-blocker.  Due to increased septal hypertrophy and outflow gradient on previous echo in July 2018, he was started on Cardizem.  This was later discontinued due to concern of conduction disease with first-degree AV block, left anterior fascicular block and a right bundle branch block. He later underwent laser ablation of varicose veins by Dr. Darrick Penna. He was diagnosed with PE on CT angiogram of the chest in January 2029 and was placed on Xarelto.  PE was felt to be provoked by laser vein treatment.  He completed 6 months of therapy with Xarelto and switch to aspirin.  CT angiogram of the chest recently showed 4.6 cm thoracic aortic aneurysm.  Recent lower extremity venous Doppler obtained on 04/10/2019 was negative for DVT.  Repeat echocardiogram obtained on 04/24/2019, EF was greater than 65%, severe LVH, significant outflow obstruction at the mid cavity and  LVOT with peak  gradient of at least 100 mmHg, systolic anterior motion of mitral leaflet, mild dilatation of the ascending aorta measuring 43 mm.   We did proceed with cardiac cath on 06/26/19. This showed normal coronary anatomy. Normal LV and right heart pressures. LVOT gradient 20 mm Hg by cath. MRI was also done showing typical features of HOCM with predominant sigmoid septum and SAM. There was only mild late gadolinium enhancement. An Event monitor was placed. Preliminary results reviewed today.   On follow up today he feels that his breathing is worse. He gets SOB walking up stairs, lifting something or if he gets in a hurry. It seems to be worse later in the day. No chest pain. No palpitations or syncope. No edema, orthopnea or PND.    Past Medical History:  Diagnosis Date  . ALLERGIC RHINITIS 08/16/2009   Qualifier: Diagnosis of  By: Chase Hill, Chase Hill   Onset:as a child Character: perennial but increased seasonally Triggers: dust mites; minor as pollen Allergy testing: yes X 2; as child & in 1995, Dr Chase Hill. Never took shots Maintenance medications/ response:Loratidine daily Smoking history:never Family history pulmonary disease: no     . Cervical radiculopathy at C7    PMH of  . Chest pain 11/22/2012   Evaluated by Dr Marca Ancona, Cardiology 2014 ECHO, MR : ? hypertrophic cardiomyopathy Rx: Beta blocker   . Diverticulosis    colonoscopy 10-15-2006  . DIVERTICULOSIS, COLON 08/16/2009   Qualifier: Diagnosis of  By: Chase Hill, Chase Hill   Dr Chase Hill, GI    . DVT (deep venous thrombosis) (HCC)   . ELECTROCARDIOGRAM, ABNORMAL  08/16/2009   Qualifier: Diagnosis of  By: Chase Hill, William   Right bundle branch block, left posterior fascicular block( bifascicular block), not present 01/17/2007; noted 08/16/2009    . GERD (gastroesophageal reflux disease)   . Gilbert syndrome   . GILBERT'S SYNDROME 01/14/2007   Qualifier: Diagnosis of  By: Chase Hill, Chase Hill    . Hemorrhoids   . Hyperlipidemia   .  Hypothyroidism   . Obstructive hypertrophic cardiomyopathy (HCC) 01/15/2013  . Perennial allergic rhinitis with seasonal variation   . Pulmonary embolism (HCC)   . REFLUX, ESOPHAGEAL 01/14/2007   Qualifier: Diagnosis of  By: Chase Hill, Chase Hill    . Thoracic aortic aneurysm without rupture (HCC) 03/20/2017  . THYROID FUNCTION TEST, ABNORMAL 09/07/2010   Qualifier: Diagnosis of  By: Chase Hill, Chase Hill     . Varicose veins of right lower extremity with complications 02/04/2018   Past Surgical History:  Procedure Laterality Date  . COLONOSCOPY  2010   Tics  . ENDOVENOUS ABLATION SAPHENOUS VEIN W/ LASER Right 08/21/2018   endovenous laser ablation right greater saphenous vein and stab phlebectomy > 20 incisions right leg by Fabienne Bruns Hill   . RIGHT/LEFT HEART CATH AND CORONARY ANGIOGRAPHY N/A 06/26/2019   Procedure: RIGHT/LEFT HEART CATH AND CORONARY ANGIOGRAPHY;  Surgeon: [place], Chase Pinkett Hill, Hill;  Location: [facility] INVASIVE CV LAB;  Service: Cardiovascular;  Laterality: N/A;  . WISDOM TOOTH EXTRACTION       Current Meds  Medication Sig  . ASHWAGANDHA PO Take 1 capsule by mouth daily.  Marland Kitchen aspirin EC 325 MG tablet Take 325 mg by mouth at bedtime.  . docusate sodium (COLACE) 100 MG capsule Take 200-300 mg by mouth 2 (two) times daily.  Marland Kitchen levothyroxine (SYNTHROID, LEVOTHROID) 125 MCG tablet Take 125 mcg by mouth daily before breakfast.   . loratadine (CLARITIN) 10 MG tablet Take 10 mg by mouth daily.  . Melatonin 5 MG CAPS Take 5 mg by mouth at bedtime as needed (sleep).  . Multiple Vitamin (MULTIVITAMIN) tablet Take 1 tablet by mouth daily.    . Pseudoephedrine-APAP-DM (DAYQUIL PO) Take 2 capsules by mouth 2 (two) times daily as needed (cold symptoms).  . rosuvastatin (CRESTOR) 5 MG tablet Take 5 mg by mouth 3 (three) times a week.   Current Facility-Administered Medications for the 08/19/19 encounter (Office Visit) with [place], Chase Nanna Hill, Hill  Medication  . sodium chloride flush (NS) 0.9 % injection 3 mL       Allergies:   Patient has no known allergies.   Social History   Tobacco Use  . Smoking status: Never Smoker  . Smokeless tobacco: Never Used  Substance Use Topics  . Alcohol use: Yes    Alcohol/week: 2.0 standard drinks    Types: 2 Glasses of wine per week    Comment:  < 3 /week , only socially  . Drug use: No     Family Hx: The patient's family history includes Breast cancer in his mother; Congestive Heart Failure in his father; Coronary artery disease in his father and paternal aunt; Stroke (age of onset: 56) in his maternal grandmother. There is no history of Diabetes, Hypertension, or Colon cancer.  ROS:   Please see the history of present illness.    All other systems reviewed and are negative.   Prior CV studies:   The following studies were reviewed today:  CTA of chest 02/19/2019 IMPRESSION: 1. No change in caliber of the tubular ascending thoracic aorta, measuring 4.6 x 4.3 cm,  not significantly changed in caliber, measuring 4.5 x 4.2 cm on examinations dating back to 03/23/2017. The sinuses of Valsalva measure up to 4.4 cm in caliber. Normal caliber of the aortic valve. The descending thoracic aorta is normal in caliber. No significant atherosclerosis.  2. Examination is not tailored for the evaluation of pulmonary embolism, however there is no obvious residual pulmonary embolism in the right lower lobe at the site of embolism seen on prior examination dated 09/11/2018.  3. Stable, benign small pulmonary nodules.   Venous doppler 04/10/2019 Summary: Right: Successful vein closure. There is no evidence of deep vein thrombosis in the lower extremity. No cystic structure found in the popliteal fossa. No significant change compared to previous study. Left: No evidence of common femoral vein obstruction.  *See table(s) above for measurements and observations.   Echo 04/24/2019 IMPRESSIONS 1. The left ventricle has hyperdynamic systolic function,  with an ejection fraction of >65%. The cavity size was normal. There is severely increased left ventricular wall thickness. Left ventricular diastolic Doppler parameters are consistent with  impaired relaxation. 2. LVOT is narrowed The LV is severely hypertrophied. There is significant outflow obstruction at mid cavity and through LVOT with peak gradient at least 100 mm Hg. All consistent with HOCM. 3. The right ventricle has normal systolic function. The cavity was normal. There is no increase in right ventricular wall thickness. 4. Left atrial size was mildly dilated. 5. There is systolic anterior motion of mitral leaflets. 6. The aortic valve is tricuspid. Mild thickening of the aortic valve. Aortic valve regurgitation is mild by color flow Doppler. 7. The aorta is abnormal unless otherwise noted. 8. The ascending aorta is normal in size and structure. 9. There is mild dilatation of the ascending aorta measuring 43 mm.   Cardiac cath 06/26/19:  RIGHT/LEFT HEART CATH AND CORONARY ANGIOGRAPHY  Conclusion    The left ventricular systolic function is normal.  LV end diastolic pressure is normal.  The left ventricular ejection fraction is 55-65% by visual estimate.   1. Normal coronary anatomy 2. Normal LV function' 3. 20 mm Hg LVOT gradient at rest. Typical Brockenbrough sign post PVC. 4. Normal/low LV filling pressures 5. Normal right  Heart pressures 6. Normal cardiac output 7. Moderate MR but post PVC  Plan: recommend further evaluation with cardiac MRI and event monitor.     CLINICAL DATA:  Hypertrophic cardiomyopathy suspected, further testing  COMPARISON: No images available from prior cardiac MRI for comparison  EXAM: CARDIAC MRI  TECHNIQUE: The patient was scanned on a 1.5 Tesla GE magnet. A dedicated cardiac coil was used. Functional imaging was done using Fiesta sequences. 2,3, and 4 chamber views were done to assess for RWMA's. Modified  Simpson's rule using a short axis stack was used to calculate an ejection fraction on a dedicated work Conservation officer, nature. The patient received 81mL GADAVIST GADOBUTROL 1 MMOL/ML IV SOLN. After 10 minutes inversion recovery sequences were used to assess for infiltration and scar tissue.  CONTRAST:  50mL GADAVIST GADOBUTROL 1 MMOL/ML IV SOLN  FINDINGS: LEFT VENTRICLE:  Normal left ventricular size. Hyperdynamic systolic function (LVEF = 69%).  There are no regional wall motion abnormalities.  Late gadolinium enhancement in the left ventricular myocardium is seen in the inferior RV insertion point at the mid ventricle and apex. This finding is seen in an area of increased myocardial wall thickness. Differential diagnosis includes fibrosis secondary to hypertrophic cardiomyopathy vs increased pulmonary pressures.  Normal T1 myocardial nulling kinetics suggest  against a diagnosis of cardiac amyloidosis.  Maximal wall thickness: 18 mm  Location: Basal septum.  There is systolic anterior motion of the mitral valve. Flow dephasing suggests left ventricular outflow tract obstruction.  Mitral regurgitation is present and posteriorly directed.  No evidence of left ventricular apical aneurysm.  Findings are consistent with hypertrophic cardiomyopathy with obstruction. Morphologic subtype: Sigmoid subtype.  RIGHT VENTRICLE:  Normal right ventricular size, thickness and systolic function (RVEF = 63%).  There are no regional wall motion abnormalities.  No delayed myocardial enhancement in the right ventricle.  ATRIA:  Mildly dilated left atrial chamber size.  normal right atrial size.  VALVES:  Systolic anterior motion of the mitral valve with posteriorly directed mitral regurgitation, qualitatively mild.  Qualitatively trivial aortic valve regurgitation by flow dephasing.  PERICARDIUM:  Normal pericardium.  No pericardial  effusion.  AORTA:  Mid ascending aorta measures 43 mm in a sagittal transverse plane. Orthogonal measurements unable to be performed for true axial measurement.  OTHER:  MEASUREMENTS:  LVEDV: 145 ml  LVESV: 45 ml  SV: 100 ml  CO: 6 L/min  Myocardial mass: 184 g  RVEDV: 100 ml  RVEDS: 38 ml  RVSV: 63 mL  IMPRESSION: 1.  Hyperdynamic left ventricular systolic function.  LVEF 69%.  2.  Normal right ventricular chamber size and function.  RVEF 63%.  3. Findings consistent with hypertrophic cardiomyopathy, sigmoid subtype, maximal wall thickness 18 mm at the basal left ventricular septum.  4. Left ventricular outflow tract obstruction. Flow dephasing seen in the LVOT with systolic anterior motion of the mitral valve, and posteriorly directed mitral valve regurgitation.  5. Delayed myocardial enhancement is seen in an area of increased left ventricular wall thickness at the mid ventricle, at the inferior RV insertion point. This finding could represent fibrosis in the setting hypertrophic cardiomyopathy vs. increased pulmonary pressures.  6.  Mid ascending aorta measures 43 mm in a transverse plane.   Electronically Signed   By: Weston Brass  Preliminary findings or event monitor reviewed. NSR with PVCs, one run of AIVR. Several short runs of PAT. Second degree AV  Block type 1.   Labs/Other Tests and Data Reviewed:    EKG:  No ECG reviewed.  Recent Labs: 06/23/2019: ALT 19; BUN 18; Creatinine, Ser 1.24; Platelets 208 06/26/2019: Hemoglobin 14.3; Potassium 4.2; Sodium 140   Recent Lipid Panel Lab Results  Component Value Date/Time   CHOL 182 06/23/2019 09:19 AM   CHOL 202 (H) 04/29/2015 07:34 AM   TRIG 180 (H) 06/23/2019 09:19 AM   TRIG 165 (H) 04/29/2015 07:34 AM   HDL 51 06/23/2019 09:19 AM   HDL 47 04/29/2015 07:34 AM   CHOLHDL 3.6 06/23/2019 09:19 AM   CHOLHDL 3 08/24/2016 07:27 AM   LDLCALC 100 (H) 06/23/2019 09:19 AM    LDLCALC 122 (H) 04/29/2015 07:34 AM   LDLDIRECT 78.7 03/28/2013 07:32 AM   Dated 08/12/19: creatinine 1.2. otherwise CMET normal. CBC and TSH normal. Cholesterol 177, triglycerides 209, HDL 43, LDL 92.   Wt Readings from Last 3 Encounters:  08/19/19 213 lb 3.2 oz (96.7 kg)  07/09/19 214 lb 12.8 oz (97.4 kg)  06/26/19 205 lb (93 kg)     Objective:    Vital Signs:  BP 130/81   Pulse 69   Temp (!) 95.5 F (35.3 C)   Ht  (1.803 Hill)   Wt 213 lb 3.2 oz (96.7 kg)   SpO2 93%   BMI 29.74 kg/Hill  GENERAL:  Well appearing WM in NAD HEENT:  PERRL, EOMI, sclera are clear. Oropharynx is clear. NECK:  No jugular venous distention, carotid upstroke brisk and symmetric, no bruits, no thyromegaly or adenopathy LUNGS:  Clear to auscultation bilaterally CHEST:  Unremarkable HEART:  RRR,  PMI not displaced or sustained,S1 and S2 within normal limits, no S3, no S4: no clicks, no rubs, no murmurs ABD:  Soft, nontender. BS +, no masses or bruits. No hepatomegaly, no splenomegaly EXT:  2 + pulses throughout, no edema, no cyanosis no clubbing SKIN:  Warm and dry.  No rashes NEURO:  Alert and oriented x 3. Cranial nerves II through XII intact. PSYCH:  Cognitively intact   ASSESSMENT & PLAN:    1. Hypertrophic cardiomyopathy: Most recent Echo with marked increase in LVOT gradient 100 mm Hg. Symptom of dyspnea on exertion and dizziness with exertion.  Intolerant of beta blocker in the past due to dizziness and I am concerned about starting any AV nodal blockade given evidence of conduction system disease and intermittent heart block noted on monitor. Cardiac cath done showing normal coronary anatomy. LVOT gradient lower than expected based on Echo. I did review Echo with Dr Chase LatchMclean- we felt LVOT gradient on Echo was real. MRI shows typical features of HOCM with sigmoid septum. Minimal late gadolinium enhancement. We discussed options for treatment including septal myectomy, alcohol septal ablation, and  placement of pacemaker which would allow titration of medication such as beta blockers or calcium channel blockers. Patient would like to wait until at least after her gets the Covid 19 vaccine before pursuing other options. Will follow up in a couple of months.     2. Thoracic aortic aneurysm: Mildly dilated aorta on recent echocardiogram measuring up to 43 mm. Follow up yearly.   3. Hyperlipidemia: Continue Crestor  4. Hypothyroidism: On Synthroid  5. History of PE: Finished 937-month therapy with Xarelto, Xarelto have been discontinued since end of August 2020.  6. Second degree AV block Mobitz type 1.   Medication Adjustments/Labs and Tests Ordered: Current medicines are reviewed at length with the patient today.  Concerns regarding medicines are outlined above.   Tests Ordered: No orders of the defined types were placed in this encounter.   Medication Changes: No orders of the defined types were placed in this encounter.   Follow Up: 2 months  Signed, Swayze Kozuch [place], Hill  08/19/2019 1:27 PM    [facility]

## 2019-08-19 ENCOUNTER — Ambulatory Visit (INDEPENDENT_AMBULATORY_CARE_PROVIDER_SITE_OTHER): Payer: BC Managed Care – PPO | Admitting: Cardiology

## 2019-08-19 ENCOUNTER — Other Ambulatory Visit: Payer: Self-pay

## 2019-08-19 ENCOUNTER — Encounter: Payer: Self-pay | Admitting: Cardiology

## 2019-08-19 VITALS — BP 130/81 | HR 69 | Temp 95.5°F | Ht 71.0 in | Wt 213.2 lb

## 2019-08-19 DIAGNOSIS — I441 Atrioventricular block, second degree: Secondary | ICD-10-CM | POA: Diagnosis not present

## 2019-08-19 DIAGNOSIS — R0600 Dyspnea, unspecified: Secondary | ICD-10-CM

## 2019-08-19 DIAGNOSIS — I421 Obstructive hypertrophic cardiomyopathy: Secondary | ICD-10-CM

## 2019-08-19 DIAGNOSIS — R0609 Other forms of dyspnea: Secondary | ICD-10-CM

## 2019-08-19 DIAGNOSIS — Z1212 Encounter for screening for malignant neoplasm of rectum: Secondary | ICD-10-CM | POA: Diagnosis not present

## 2019-08-19 DIAGNOSIS — E039 Hypothyroidism, unspecified: Secondary | ICD-10-CM | POA: Diagnosis not present

## 2019-08-19 DIAGNOSIS — I739 Peripheral vascular disease, unspecified: Secondary | ICD-10-CM | POA: Diagnosis not present

## 2019-08-19 DIAGNOSIS — E785 Hyperlipidemia, unspecified: Secondary | ICD-10-CM | POA: Diagnosis not present

## 2019-08-19 DIAGNOSIS — Z0000 Encounter for general adult medical examination without abnormal findings: Secondary | ICD-10-CM | POA: Diagnosis not present

## 2019-08-19 DIAGNOSIS — N1831 Chronic kidney disease, stage 3a: Secondary | ICD-10-CM | POA: Diagnosis not present

## 2019-08-19 DIAGNOSIS — Z86711 Personal history of pulmonary embolism: Secondary | ICD-10-CM

## 2019-08-19 DIAGNOSIS — Z1331 Encounter for screening for depression: Secondary | ICD-10-CM | POA: Diagnosis not present

## 2019-08-19 LAB — IFOBT (OCCULT BLOOD): IFOBT: NEGATIVE

## 2019-08-20 ENCOUNTER — Other Ambulatory Visit: Payer: Self-pay | Admitting: Student

## 2019-08-20 DIAGNOSIS — R0600 Dyspnea, unspecified: Secondary | ICD-10-CM

## 2019-08-20 DIAGNOSIS — R42 Dizziness and giddiness: Secondary | ICD-10-CM

## 2019-08-20 DIAGNOSIS — I421 Obstructive hypertrophic cardiomyopathy: Secondary | ICD-10-CM

## 2019-10-03 ENCOUNTER — Telehealth: Payer: Self-pay

## 2019-10-03 NOTE — Telephone Encounter (Signed)
Spoke to [facility] in [place] HOCM clinic about patient needing appointment with Dr.Kyle Lexine Baton.Advised to mail patient's following records.Patient ID # will be [id number].  Patient's last 2 office notes,last 2 ekgs,09/25/19 cardiac mri report with cd,02/19/19 chest ct report with cd,06/26/19 cardiac cath report with cd, 04/24/19 echo report with cd mailed ( certified mail ) 10/02/18 to [facility] [phone] Dr.Kyle Klarich [address] [address].[facility] will call patient with appointment once they have reviewed records.

## 2019-10-09 NOTE — Progress Notes (Signed)
Date:  10/14/2019   ID:  Chase Hill, DOB April 18, 1946, MRN [id number]  PCP:  [name], MD  Cardiologist:  Dhillon Comunale Swaziland, MD  Electrophysiologist:  None   Evaluation Performed:  Follow-Up Visit   Chief Complaint:  HOCM  History of Present Illness:    Chase Hill is a 74 y.o. male with PMH of hypertrophic cardiomyopathy, history of DVT/PE, TAA, hyperlipidemia, RBBB, Gilbert's syndrome, hypothyroidism.  He was previously followed by Dr. Shirlee Latch.  Stress Myoview in 2011 showed inferior and apical ischemia.  Subsequent cardiac cath showed no obstructive disease.  In 2014, echocardiogram demonstrated basal septal hypertrophy with mild LV outflow tract gradient and chordal versus valvular SAM, moderate diastolic dysfunction.  Cardiac MRI obtained in June 2014 showed mild focal basal septal hypertrophy with systolic anterior motion of the mitral valve and mild MR.  There was no significant delayed enhancement.  He has been intolerant of pravastatin due to myalgia.  He also developed  fatigue and orthostasis on beta-blocker.  Due to increased septal hypertrophy and outflow gradient on previous echo in July 2018, he was started on Cardizem.  This was later discontinued due to concern of conduction disease with first-degree AV block, left anterior fascicular block and a right bundle branch block. He later underwent laser ablation of varicose veins by Dr. Darrick Penna. He was diagnosed with PE on CT angiogram of the chest in January 2029 and was placed on Xarelto.  PE was felt to be provoked by laser vein treatment.  He completed 6 months of therapy with Xarelto and switch to aspirin.  CT angiogram of the chest recently showed 4.6 cm thoracic aortic aneurysm.  Recent lower extremity venous Doppler obtained on 04/10/2019 was negative for DVT.  Repeat echocardiogram obtained on 04/24/2019, EF was greater than 65%, severe LVH, significant outflow obstruction at the mid cavity and  LVOT with peak  gradient of at least 100 mmHg, systolic anterior motion of mitral leaflet, mild dilatation of the ascending aorta measuring 43 mm.   We did proceed with cardiac cath on 06/26/19. This showed normal coronary anatomy. Normal LV and right heart pressures. LVOT gradient 20 mm Hg by cath. MRI was also done showing typical features of HOCM with predominant sigmoid septum and SAM. There was only mild late gadolinium enhancement. An Event monitor was placed. Preliminary results reviewed today.   On follow up today he feels that his breathing is getting  worse. He gets SOB walking up stairs or incline, lifting something or if he gets in a hurry. It seems to be worse later in the day. No chest pain. No palpitations or syncope. No edema, orthopnea or PND. He states he has gotten to the point where he just avoids going up stairs. It has also limited him from playing golf.    Past Medical History:  Diagnosis Date  . ALLERGIC RHINITIS 08/16/2009   Qualifier: Diagnosis of  By: Alwyn Ren MD, Chrissie Noa   Onset:as a child Character: perennial but increased seasonally Triggers: dust mites; minor as pollen Allergy testing: yes X 2; as child & in 1995, Dr Jethro Bolus. Never took shots Maintenance medications/ response:Loratidine daily Smoking history:never Family history pulmonary disease: no     . Cervical radiculopathy at C7    PMH of  . Chest pain 11/22/2012   Evaluated by Dr Marca Ancona, Cardiology 2014 ECHO, MR : ? hypertrophic cardiomyopathy Rx: Beta blocker   . Diverticulosis    colonoscopy 10-15-2006  . DIVERTICULOSIS, COLON 08/16/2009  Qualifier: Diagnosis of  By: Linna Darner MD, Gwyndolyn Saxon   Dr Sharlett Iles, GI    . DVT (deep venous thrombosis) ([place])   . ELECTROCARDIOGRAM, ABNORMAL 08/16/2009   Qualifier: Diagnosis of  By: Linna Darner MD, William   Right bundle branch block, left posterior fascicular block( bifascicular block), not present 01/17/2007; noted 08/16/2009    . GERD (gastroesophageal reflux disease)   . Gilbert  syndrome   . GILBERT'S SYNDROME 01/14/2007   Qualifier: Diagnosis of  By: Linna Darner MD, Gwyndolyn Saxon    . Hemorrhoids   . Hyperlipidemia   . Hypothyroidism   . Obstructive hypertrophic cardiomyopathy ([place]) 01/15/2013  . Perennial allergic rhinitis with seasonal variation   . Pulmonary embolism ([place])   . REFLUX, ESOPHAGEAL 01/14/2007   Qualifier: Diagnosis of  By: Linna Darner MD, Gwyndolyn Saxon    . Thoracic aortic aneurysm without rupture ([place]) 03/20/2017  . THYROID FUNCTION TEST, ABNORMAL 09/07/2010   Qualifier: Diagnosis of  By: Linna Darner MD, Gwyndolyn Saxon     . Varicose veins of right lower extremity with complications 6/94/8546   Past Surgical History:  Procedure Laterality Date  . COLONOSCOPY  2010   Tics  . ENDOVENOUS ABLATION SAPHENOUS VEIN W/ LASER Right 08/21/2018   endovenous laser ablation right greater saphenous vein and stab phlebectomy > 20 incisions right leg by Ruta Hinds MD   . RIGHT/LEFT HEART CATH AND CORONARY ANGIOGRAPHY N/A 06/26/2019   Procedure: RIGHT/LEFT HEART CATH AND CORONARY ANGIOGRAPHY;  Surgeon: Martinique, Edie Darley M, MD;  Location: [facility];  Service: Cardiovascular;  Laterality: N/A;  . WISDOM TOOTH EXTRACTION       No outpatient medications have been marked as taking for the 10/14/19 encounter (Office Visit) with Martinique, Buckley Bradly M, MD.   Current Facility-Administered Medications for the 10/14/19 encounter (Office Visit) with Martinique, Ruthmary Occhipinti M, MD  Medication  . sodium chloride flush (NS) 0.9 % injection 3 mL     Allergies:   Patient has no known allergies.   Social History   Tobacco Use  . Smoking status: Never Smoker  . Smokeless tobacco: Never Used  Substance Use Topics  . Alcohol use: Yes    Alcohol/week: 2.0 standard drinks    Types: 2 Glasses of wine per week    Comment:  < 3 /week , only socially  . Drug use: No     Family Hx: The patient's family history includes Breast cancer in his mother; Congestive Heart Failure in his father; Coronary artery disease in his  father and paternal aunt; Stroke (age of onset: 81) in his maternal grandmother. There is no history of Diabetes, Hypertension, or Colon cancer.  ROS:   Please see the history of present illness.    All other systems reviewed and are negative.   Prior CV studies:   The following studies were reviewed today:  CTA of chest 02/19/2019 IMPRESSION: 1. No change in caliber of the tubular ascending thoracic aorta, measuring 4.6 x 4.3 cm, not significantly changed in caliber, measuring 4.5 x 4.2 cm on examinations dating back to 03/23/2017. The sinuses of Valsalva measure up to 4.4 cm in caliber. Normal caliber of the aortic valve. The descending thoracic aorta is normal in caliber. No significant atherosclerosis.  2. Examination is not tailored for the evaluation of pulmonary embolism, however there is no obvious residual pulmonary embolism in the right lower lobe at the site of embolism seen on prior examination dated 09/11/2018.  3. Stable, benign small pulmonary nodules.   Venous doppler 04/10/2019 Summary: Right: Successful vein  closure. There is no evidence of deep vein thrombosis in the lower extremity. No cystic structure found in the popliteal fossa. No significant change compared to previous study. Left: No evidence of common femoral vein obstruction.  *See table(s) above for measurements and observations.   Echo 04/24/2019 IMPRESSIONS 1. The left ventricle has hyperdynamic systolic function, with an ejection fraction of >65%. The cavity size was normal. There is severely increased left ventricular wall thickness. Left ventricular diastolic Doppler parameters are consistent with  impaired relaxation. 2. LVOT is narrowed The LV is severely hypertrophied. There is significant outflow obstruction at mid cavity and through LVOT with peak gradient at least 100 mm Hg. All consistent with HOCM. 3. The right ventricle has normal systolic function. The cavity was normal. There  is no increase in right ventricular wall thickness. 4. Left atrial size was mildly dilated. 5. There is systolic anterior motion of mitral leaflets. 6. The aortic valve is tricuspid. Mild thickening of the aortic valve. Aortic valve regurgitation is mild by color flow Doppler. 7. The aorta is abnormal unless otherwise noted. 8. The ascending aorta is normal in size and structure. 9. There is mild dilatation of the ascending aorta measuring 43 mm.   Cardiac cath 06/26/19:  RIGHT/LEFT HEART CATH AND CORONARY ANGIOGRAPHY  Conclusion    The left ventricular systolic function is normal.  LV end diastolic pressure is normal.  The left ventricular ejection fraction is 55-65% by visual estimate.   1. Normal coronary anatomy 2. Normal LV function' 3. 20 mm Hg LVOT gradient at rest. Typical Brockenbrough sign post PVC. 4. Normal/low LV filling pressures 5. Normal right  Heart pressures 6. Normal cardiac output 7. Moderate MR but post PVC  Plan: recommend further evaluation with cardiac MRI and event monitor.     CLINICAL DATA:  Hypertrophic cardiomyopathy suspected, further testing  COMPARISON: No images available from prior cardiac MRI for comparison  EXAM: CARDIAC MRI  TECHNIQUE: The patient was scanned on a 1.5 Tesla GE magnet. A dedicated cardiac coil was used. Functional imaging was done using Fiesta sequences. 2,3, and 4 chamber views were done to assess for RWMA's. Modified Simpson's rule using a short axis stack was used to calculate an ejection fraction on a dedicated work [employer]. The patient received 22mL GADAVIST GADOBUTROL 1 MMOL/ML IV SOLN. After 10 minutes inversion recovery sequences were used to assess for infiltration and scar tissue.  CONTRAST:  73mL GADAVIST GADOBUTROL 1 MMOL/ML IV SOLN  FINDINGS: LEFT VENTRICLE:  Normal left ventricular size. Hyperdynamic systolic function (LVEF = 69%).  There are no regional  wall motion abnormalities.  Late gadolinium enhancement in the left ventricular myocardium is seen in the inferior RV insertion point at the mid ventricle and apex. This finding is seen in an area of increased myocardial wall thickness. Differential diagnosis includes fibrosis secondary to hypertrophic cardiomyopathy vs increased pulmonary pressures.  Normal T1 myocardial nulling kinetics suggest against a diagnosis of cardiac amyloidosis.  Maximal wall thickness: 18 mm  Location: Basal septum.  There is systolic anterior motion of the mitral valve. Flow dephasing suggests left ventricular outflow tract obstruction.  Mitral regurgitation is present and posteriorly directed.  No evidence of left ventricular apical aneurysm.  Findings are consistent with hypertrophic cardiomyopathy with obstruction. Morphologic subtype: Sigmoid subtype.  RIGHT VENTRICLE:  Normal right ventricular size, thickness and systolic function (RVEF = 63%).  There are no regional wall motion abnormalities.  No delayed myocardial enhancement in the right ventricle.  ATRIA:  Mildly dilated left atrial chamber size.  normal right atrial size.  VALVES:  Systolic anterior motion of the mitral valve with posteriorly directed mitral regurgitation, qualitatively mild.  Qualitatively trivial aortic valve regurgitation by flow dephasing.  PERICARDIUM:  Normal pericardium.  No pericardial effusion.  AORTA:  Mid ascending aorta measures 43 mm in a sagittal transverse plane. Orthogonal measurements unable to be performed for true axial measurement.  OTHER:  MEASUREMENTS:  LVEDV: 145 ml  LVESV: 45 ml  SV: 100 ml  CO: 6 L/min  Myocardial mass: 184 g  RVEDV: 100 ml  RVEDS: 38 ml  RVSV: 63 mL  IMPRESSION: 1.  Hyperdynamic left ventricular systolic function.  LVEF 69%.  2.  Normal right ventricular chamber size and function.  RVEF 63%.  3. Findings  consistent with hypertrophic cardiomyopathy, sigmoid subtype, maximal wall thickness 18 mm at the basal left ventricular septum.  4. Left ventricular outflow tract obstruction. Flow dephasing seen in the LVOT with systolic anterior motion of the mitral valve, and posteriorly directed mitral valve regurgitation.  5. Delayed myocardial enhancement is seen in an area of increased left ventricular wall thickness at the mid ventricle, at the inferior RV insertion point. This finding could represent fibrosis in the setting hypertrophic cardiomyopathy vs. increased pulmonary pressures.  6.  Mid ascending aorta measures 43 mm in a transverse plane.   Electronically Signed   By: Weston Brass  Preliminary findings or event monitor reviewed. NSR with PVCs, one run of AIVR. Several short runs of PAT. Second degree AV  Block type 1.   Labs/Other Tests and Data Reviewed:    EKG:  No ECG reviewed.  Recent Labs: 06/23/2019: ALT 19; BUN 18; Creatinine, Ser 1.24; Platelets 208 06/26/2019: Hemoglobin 14.3; Potassium 4.2; Sodium 140   Recent Lipid Panel Lab Results  Component Value Date/Time   CHOL 182 06/23/2019 09:19 AM   CHOL 202 (H) 04/29/2015 07:34 AM   TRIG 180 (H) 06/23/2019 09:19 AM   TRIG 165 (H) 04/29/2015 07:34 AM   HDL 51 06/23/2019 09:19 AM   HDL 47 04/29/2015 07:34 AM   CHOLHDL 3.6 06/23/2019 09:19 AM   CHOLHDL 3 08/24/2016 07:27 AM   LDLCALC 100 (H) 06/23/2019 09:19 AM   LDLCALC 122 (H) 04/29/2015 07:34 AM   LDLDIRECT 78.7 03/28/2013 07:32 AM   Dated 08/12/19: creatinine 1.2. otherwise CMET normal. CBC and TSH normal. Cholesterol 177, triglycerides 209, HDL 43, LDL 92.   Wt Readings from Last 3 Encounters:  10/14/19 215 lb (97.5 kg)  08/19/19 213 lb 3.2 oz (96.7 kg)  07/09/19 214 lb 12.8 oz (97.4 kg)     Objective:    Vital Signs:  BP 122/80   Pulse 67   Temp 97.8 F (36.6 C)   Ht 5\' 11"  (1.803 m)   Wt 215 lb (97.5 kg)   SpO2 90%   BMI 29.99 kg/m     GENERAL:  Well appearing WM in NAD HEENT:  PERRL, EOMI, sclera are clear. Oropharynx is clear. NECK:  No jugular venous distention, carotid upstroke brisk and symmetric, no bruits, no thyromegaly or adenopathy LUNGS:  Clear to auscultation bilaterally CHEST:  Unremarkable HEART:  RRR,  PMI not displaced or sustained,S1 and S2 within normal limits, no S3, no S4: no clicks, no rubs, no murmurs ABD:  Soft, nontender. BS +, no masses or bruits. No hepatomegaly, no splenomegaly EXT:  2 + pulses throughout, no edema, no cyanosis no clubbing SKIN:  Warm and dry.  No rashes NEURO:  Alert and oriented x 3. Cranial nerves II through XII intact. PSYCH:  Cognitively intact   ASSESSMENT & PLAN:    1. Hypertrophic cardiomyopathy: Most recent Echo with marked increase in LVOT gradient 100 mm Hg. Symptom of dyspnea on exertion and dizziness with exertion.  Intolerant of beta blocker in the past due to dizziness and I am concerned about starting any AV nodal blockade given evidence of conduction system disease and intermittent heart block noted on monitor. Cardiac cath done showing normal coronary anatomy. LVOT gradient lower than expected based on Echo. I did review Echo with Dr Shirlee Latch- we felt LVOT gradient on Echo was real. MRI shows typical features of HOCM with sigmoid septum. Minimal late gadolinium enhancement. We discussed options for treatment including septal myectomy, alcohol septal ablation, and placement of pacemaker which would allow titration of medication such as beta blockers or calcium channel blockers. Patient would like to be evaluated at [facility] and we have sent his information there. Planning to see Dr Guilford Shi. We will arrange follow up here once he has been evaluated there.   2. Thoracic aortic aneurysm: Mildly dilated aorta on recent echocardiogram measuring up to 43 mm. Follow up yearly.   3. Hyperlipidemia: Continue Crestor  4. Hypothyroidism: On Synthroid  5. History of  PE: Finished 77-month therapy with Xarelto, Xarelto have been discontinued since end of August 2020.   6. Second degree AV block Mobitz type 1.   Medication Adjustments/Labs and Tests Ordered: Current medicines are reviewed at length with the patient today.  Concerns regarding medicines are outlined above.   Tests Ordered: No orders of the defined types were placed in this encounter.   Medication Changes: No orders of the defined types were placed in this encounter.   Follow Up: 2 months  Signed, Dajuan Turnley Swaziland, MD  10/14/2019 12:15 PM    [facility]

## 2019-10-14 ENCOUNTER — Ambulatory Visit (INDEPENDENT_AMBULATORY_CARE_PROVIDER_SITE_OTHER): Payer: BC Managed Care – PPO | Admitting: Cardiology

## 2019-10-14 ENCOUNTER — Encounter: Payer: Self-pay | Admitting: Cardiology

## 2019-10-14 ENCOUNTER — Other Ambulatory Visit: Payer: Self-pay

## 2019-10-14 VITALS — BP 122/80 | HR 67 | Temp 97.8°F | Ht 71.0 in | Wt 215.0 lb

## 2019-10-14 DIAGNOSIS — I441 Atrioventricular block, second degree: Secondary | ICD-10-CM

## 2019-10-14 DIAGNOSIS — I421 Obstructive hypertrophic cardiomyopathy: Secondary | ICD-10-CM

## 2019-10-14 DIAGNOSIS — R0600 Dyspnea, unspecified: Secondary | ICD-10-CM | POA: Diagnosis not present

## 2019-10-14 DIAGNOSIS — I712 Thoracic aortic aneurysm, without rupture, unspecified: Secondary | ICD-10-CM

## 2019-10-14 DIAGNOSIS — R0609 Other forms of dyspnea: Secondary | ICD-10-CM

## 2019-11-24 DIAGNOSIS — E785 Hyperlipidemia, unspecified: Secondary | ICD-10-CM | POA: Diagnosis not present

## 2019-11-24 DIAGNOSIS — I422 Other hypertrophic cardiomyopathy: Secondary | ICD-10-CM | POA: Diagnosis not present

## 2020-01-09 DIAGNOSIS — E039 Hypothyroidism, unspecified: Secondary | ICD-10-CM | POA: Diagnosis not present

## 2020-01-13 DIAGNOSIS — I422 Other hypertrophic cardiomyopathy: Secondary | ICD-10-CM | POA: Diagnosis not present

## 2020-01-13 DIAGNOSIS — I421 Obstructive hypertrophic cardiomyopathy: Secondary | ICD-10-CM | POA: Diagnosis not present

## 2020-01-13 DIAGNOSIS — Z01818 Encounter for other preprocedural examination: Secondary | ICD-10-CM | POA: Diagnosis not present

## 2020-01-14 DIAGNOSIS — I080 Rheumatic disorders of both mitral and aortic valves: Secondary | ICD-10-CM | POA: Diagnosis not present

## 2020-01-14 DIAGNOSIS — Z4589 Encounter for adjustment and management of other implanted devices: Secondary | ICD-10-CM | POA: Diagnosis not present

## 2020-01-14 DIAGNOSIS — N189 Chronic kidney disease, unspecified: Secondary | ICD-10-CM | POA: Diagnosis not present

## 2020-01-14 DIAGNOSIS — E039 Hypothyroidism, unspecified: Secondary | ICD-10-CM | POA: Diagnosis not present

## 2020-01-14 DIAGNOSIS — I422 Other hypertrophic cardiomyopathy: Secondary | ICD-10-CM | POA: Diagnosis not present

## 2020-01-14 DIAGNOSIS — R918 Other nonspecific abnormal finding of lung field: Secondary | ICD-10-CM | POA: Diagnosis not present

## 2020-01-14 DIAGNOSIS — Z950 Presence of cardiac pacemaker: Secondary | ICD-10-CM | POA: Diagnosis not present

## 2020-01-14 DIAGNOSIS — R5381 Other malaise: Secondary | ICD-10-CM | POA: Diagnosis not present

## 2020-01-14 DIAGNOSIS — J8410 Pulmonary fibrosis, unspecified: Secondary | ICD-10-CM | POA: Diagnosis not present

## 2020-01-14 DIAGNOSIS — D6959 Other secondary thrombocytopenia: Secondary | ICD-10-CM | POA: Diagnosis not present

## 2020-01-14 DIAGNOSIS — I712 Thoracic aortic aneurysm, without rupture: Secondary | ICD-10-CM | POA: Diagnosis not present

## 2020-01-14 DIAGNOSIS — E871 Hypo-osmolality and hyponatremia: Secondary | ICD-10-CM | POA: Diagnosis not present

## 2020-01-14 DIAGNOSIS — I129 Hypertensive chronic kidney disease with stage 1 through stage 4 chronic kidney disease, or unspecified chronic kidney disease: Secondary | ICD-10-CM | POA: Diagnosis not present

## 2020-01-14 DIAGNOSIS — I452 Bifascicular block: Secondary | ICD-10-CM | POA: Diagnosis not present

## 2020-01-14 DIAGNOSIS — I459 Conduction disorder, unspecified: Secondary | ICD-10-CM | POA: Diagnosis not present

## 2020-01-14 DIAGNOSIS — I491 Atrial premature depolarization: Secondary | ICD-10-CM | POA: Diagnosis not present

## 2020-01-14 DIAGNOSIS — I421 Obstructive hypertrophic cardiomyopathy: Secondary | ICD-10-CM | POA: Diagnosis not present

## 2020-01-14 DIAGNOSIS — J982 Interstitial emphysema: Secondary | ICD-10-CM | POA: Diagnosis not present

## 2020-01-14 DIAGNOSIS — I493 Ventricular premature depolarization: Secondary | ICD-10-CM | POA: Diagnosis not present

## 2020-01-14 DIAGNOSIS — Z959 Presence of cardiac and vascular implant and graft, unspecified: Secondary | ICD-10-CM | POA: Diagnosis not present

## 2020-01-14 DIAGNOSIS — I4581 Long QT syndrome: Secondary | ICD-10-CM | POA: Diagnosis not present

## 2020-01-14 DIAGNOSIS — Z9889 Other specified postprocedural states: Secondary | ICD-10-CM | POA: Diagnosis not present

## 2020-01-14 DIAGNOSIS — I4510 Unspecified right bundle-branch block: Secondary | ICD-10-CM | POA: Diagnosis not present

## 2020-01-14 DIAGNOSIS — Z86711 Personal history of pulmonary embolism: Secondary | ICD-10-CM | POA: Diagnosis not present

## 2020-01-14 DIAGNOSIS — E785 Hyperlipidemia, unspecified: Secondary | ICD-10-CM | POA: Diagnosis not present

## 2020-01-14 DIAGNOSIS — D62 Acute posthemorrhagic anemia: Secondary | ICD-10-CM | POA: Diagnosis not present

## 2020-01-14 DIAGNOSIS — I442 Atrioventricular block, complete: Secondary | ICD-10-CM | POA: Diagnosis not present

## 2020-01-14 DIAGNOSIS — J9811 Atelectasis: Secondary | ICD-10-CM | POA: Diagnosis not present

## 2020-01-14 DIAGNOSIS — I517 Cardiomegaly: Secondary | ICD-10-CM | POA: Diagnosis not present

## 2020-01-14 DIAGNOSIS — J90 Pleural effusion, not elsewhere classified: Secondary | ICD-10-CM | POA: Diagnosis not present

## 2020-01-14 DIAGNOSIS — N1831 Chronic kidney disease, stage 3a: Secondary | ICD-10-CM | POA: Diagnosis not present

## 2020-01-15 DIAGNOSIS — J9811 Atelectasis: Secondary | ICD-10-CM | POA: Diagnosis not present

## 2020-01-15 DIAGNOSIS — Z959 Presence of cardiac and vascular implant and graft, unspecified: Secondary | ICD-10-CM | POA: Diagnosis not present

## 2020-01-15 DIAGNOSIS — I459 Conduction disorder, unspecified: Secondary | ICD-10-CM | POA: Diagnosis not present

## 2020-01-15 DIAGNOSIS — I517 Cardiomegaly: Secondary | ICD-10-CM | POA: Diagnosis not present

## 2020-01-15 DIAGNOSIS — I421 Obstructive hypertrophic cardiomyopathy: Secondary | ICD-10-CM | POA: Diagnosis not present

## 2020-01-16 DIAGNOSIS — I442 Atrioventricular block, complete: Secondary | ICD-10-CM | POA: Diagnosis not present

## 2020-01-16 DIAGNOSIS — I422 Other hypertrophic cardiomyopathy: Secondary | ICD-10-CM | POA: Diagnosis not present

## 2020-01-17 DIAGNOSIS — J90 Pleural effusion, not elsewhere classified: Secondary | ICD-10-CM | POA: Diagnosis not present

## 2020-01-17 DIAGNOSIS — Z950 Presence of cardiac pacemaker: Secondary | ICD-10-CM | POA: Diagnosis not present

## 2020-01-17 DIAGNOSIS — J982 Interstitial emphysema: Secondary | ICD-10-CM | POA: Diagnosis not present

## 2020-01-17 DIAGNOSIS — I4581 Long QT syndrome: Secondary | ICD-10-CM | POA: Diagnosis not present

## 2020-01-17 DIAGNOSIS — R918 Other nonspecific abnormal finding of lung field: Secondary | ICD-10-CM | POA: Diagnosis not present

## 2020-01-17 DIAGNOSIS — I517 Cardiomegaly: Secondary | ICD-10-CM | POA: Diagnosis not present

## 2020-01-17 DIAGNOSIS — J9811 Atelectasis: Secondary | ICD-10-CM | POA: Diagnosis not present

## 2020-01-17 DIAGNOSIS — I491 Atrial premature depolarization: Secondary | ICD-10-CM | POA: Diagnosis not present

## 2020-01-19 DIAGNOSIS — Z9889 Other specified postprocedural states: Secondary | ICD-10-CM | POA: Diagnosis not present

## 2020-01-19 DIAGNOSIS — I421 Obstructive hypertrophic cardiomyopathy: Secondary | ICD-10-CM | POA: Diagnosis not present

## 2020-01-19 DIAGNOSIS — N189 Chronic kidney disease, unspecified: Secondary | ICD-10-CM | POA: Diagnosis not present

## 2020-01-19 DIAGNOSIS — Z950 Presence of cardiac pacemaker: Secondary | ICD-10-CM | POA: Diagnosis not present

## 2020-01-20 DIAGNOSIS — Z9889 Other specified postprocedural states: Secondary | ICD-10-CM | POA: Diagnosis not present

## 2020-01-20 DIAGNOSIS — R5381 Other malaise: Secondary | ICD-10-CM | POA: Diagnosis not present

## 2020-01-22 ENCOUNTER — Other Ambulatory Visit (HOSPITAL_COMMUNITY): Payer: Self-pay | Admitting: *Deleted

## 2020-01-22 ENCOUNTER — Encounter (HOSPITAL_COMMUNITY): Payer: Self-pay | Admitting: *Deleted

## 2020-01-22 DIAGNOSIS — Z9889 Other specified postprocedural states: Secondary | ICD-10-CM

## 2020-01-22 NOTE — Progress Notes (Signed)
Received interest letter fax from Dr. Claudia Desanctis at the [facility] for this pt to participate in Cardiac Rehab.  Pt is S/p septal myectomy on 6/2 and PPM on 6/4.  Reviewed accompanying medical records and 12 lead ekg  Pt is seen locally by Dr. Swaziland and has an upcoming appt with him on 7/29.  Unclear if pt will establish care with EP?? Pt completed his post op visit on today and plans to stay in [place] until 6/23.  Will forward to support staff to verify his insurance.  Pt is medicare age but only has BCBS listed.  Will need to determine what insurance coverage he has because Medicare plans do not cover cardiac rehab for his listed diagnosis. Will verify this information.  Electronic referral placed pending Dr. Swaziland signature. Continue to follow along for readiness to proceed with group exercise at Cardiac Rehab. Alanson Aly, BSN Cardiac and Emergency planning/management officer

## 2020-01-26 ENCOUNTER — Telehealth (HOSPITAL_COMMUNITY): Payer: Self-pay

## 2020-01-26 NOTE — Telephone Encounter (Signed)
Pt insurance is active and benefits verified through [employer]. Co-pay $0.00, DED $3,500.00/$3,486.00 met, out of pocket $6,500.00/$6,191.16 met, co-insurance 40%. No pre-authorization required. Passport, 01/26/20 @ 9:56AM, REF#[id number]  Will contact patient to see if he is interested in the Cardiac Rehab Program. If interested, patient will need to complete follow up appt. Once completed, patient will be contacted for scheduling upon review by the RN Navigator. 

## 2020-01-26 NOTE — Telephone Encounter (Signed)
Attempted to call patient in regards to Cardiac Rehab - LM on VM 

## 2020-01-30 DIAGNOSIS — Z9889 Other specified postprocedural states: Secondary | ICD-10-CM | POA: Diagnosis not present

## 2020-01-30 DIAGNOSIS — I421 Obstructive hypertrophic cardiomyopathy: Secondary | ICD-10-CM | POA: Diagnosis not present

## 2020-02-03 DIAGNOSIS — I422 Other hypertrophic cardiomyopathy: Secondary | ICD-10-CM | POA: Diagnosis not present

## 2020-02-03 DIAGNOSIS — Z950 Presence of cardiac pacemaker: Secondary | ICD-10-CM | POA: Diagnosis not present

## 2020-02-05 DIAGNOSIS — I480 Paroxysmal atrial fibrillation: Secondary | ICD-10-CM | POA: Diagnosis not present

## 2020-02-05 DIAGNOSIS — R918 Other nonspecific abnormal finding of lung field: Secondary | ICD-10-CM | POA: Diagnosis not present

## 2020-02-05 DIAGNOSIS — R0602 Shortness of breath: Secondary | ICD-10-CM | POA: Diagnosis not present

## 2020-02-05 DIAGNOSIS — D631 Anemia in chronic kidney disease: Secondary | ICD-10-CM | POA: Diagnosis not present

## 2020-02-05 DIAGNOSIS — D7582 Heparin induced thrombocytopenia (HIT): Secondary | ICD-10-CM | POA: Diagnosis not present

## 2020-02-05 DIAGNOSIS — I421 Obstructive hypertrophic cardiomyopathy: Secondary | ICD-10-CM | POA: Diagnosis not present

## 2020-02-05 DIAGNOSIS — I771 Stricture of artery: Secondary | ICD-10-CM | POA: Diagnosis not present

## 2020-02-05 DIAGNOSIS — I700 Atherosclerosis of aorta: Secondary | ICD-10-CM | POA: Diagnosis not present

## 2020-02-05 DIAGNOSIS — N182 Chronic kidney disease, stage 2 (mild): Secondary | ICD-10-CM | POA: Diagnosis not present

## 2020-02-05 DIAGNOSIS — I712 Thoracic aortic aneurysm, without rupture: Secondary | ICD-10-CM | POA: Diagnosis not present

## 2020-02-05 DIAGNOSIS — I824Z3 Acute embolism and thrombosis of unspecified deep veins of distal lower extremity, bilateral: Secondary | ICD-10-CM | POA: Diagnosis not present

## 2020-02-05 DIAGNOSIS — Z86711 Personal history of pulmonary embolism: Secondary | ICD-10-CM | POA: Diagnosis not present

## 2020-02-05 DIAGNOSIS — Z950 Presence of cardiac pacemaker: Secondary | ICD-10-CM | POA: Diagnosis not present

## 2020-02-05 DIAGNOSIS — E785 Hyperlipidemia, unspecified: Secondary | ICD-10-CM | POA: Diagnosis not present

## 2020-02-05 DIAGNOSIS — I82432 Acute embolism and thrombosis of left popliteal vein: Secondary | ICD-10-CM | POA: Diagnosis not present

## 2020-02-05 DIAGNOSIS — I82462 Acute embolism and thrombosis of left calf muscular vein: Secondary | ICD-10-CM | POA: Diagnosis not present

## 2020-02-05 DIAGNOSIS — R7401 Elevation of levels of liver transaminase levels: Secondary | ICD-10-CM | POA: Diagnosis not present

## 2020-02-05 DIAGNOSIS — I482 Chronic atrial fibrillation, unspecified: Secondary | ICD-10-CM | POA: Diagnosis not present

## 2020-02-05 DIAGNOSIS — I82413 Acute embolism and thrombosis of femoral vein, bilateral: Secondary | ICD-10-CM | POA: Diagnosis not present

## 2020-02-05 DIAGNOSIS — Z45010 Encounter for checking and testing of cardiac pacemaker pulse generator [battery]: Secondary | ICD-10-CM | POA: Diagnosis not present

## 2020-02-05 DIAGNOSIS — Z9889 Other specified postprocedural states: Secondary | ICD-10-CM | POA: Diagnosis not present

## 2020-02-05 DIAGNOSIS — Z20822 Contact with and (suspected) exposure to covid-19: Secondary | ICD-10-CM | POA: Diagnosis not present

## 2020-02-05 DIAGNOSIS — I4892 Unspecified atrial flutter: Secondary | ICD-10-CM | POA: Diagnosis not present

## 2020-02-05 DIAGNOSIS — R9431 Abnormal electrocardiogram [ECG] [EKG]: Secondary | ICD-10-CM | POA: Diagnosis not present

## 2020-02-05 DIAGNOSIS — I82452 Acute embolism and thrombosis of left peroneal vein: Secondary | ICD-10-CM | POA: Diagnosis not present

## 2020-02-05 DIAGNOSIS — I442 Atrioventricular block, complete: Secondary | ICD-10-CM | POA: Diagnosis not present

## 2020-02-05 DIAGNOSIS — I82412 Acute embolism and thrombosis of left femoral vein: Secondary | ICD-10-CM | POA: Diagnosis not present

## 2020-02-05 DIAGNOSIS — I2699 Other pulmonary embolism without acute cor pulmonale: Secondary | ICD-10-CM | POA: Diagnosis not present

## 2020-02-05 DIAGNOSIS — R5383 Other fatigue: Secondary | ICD-10-CM | POA: Diagnosis not present

## 2020-02-05 DIAGNOSIS — I2694 Multiple subsegmental pulmonary emboli without acute cor pulmonale: Secondary | ICD-10-CM | POA: Diagnosis not present

## 2020-02-05 DIAGNOSIS — I82442 Acute embolism and thrombosis of left tibial vein: Secondary | ICD-10-CM | POA: Diagnosis not present

## 2020-02-07 DIAGNOSIS — R7401 Elevation of levels of liver transaminase levels: Secondary | ICD-10-CM | POA: Diagnosis not present

## 2020-02-07 DIAGNOSIS — I421 Obstructive hypertrophic cardiomyopathy: Secondary | ICD-10-CM | POA: Diagnosis not present

## 2020-02-07 DIAGNOSIS — I480 Paroxysmal atrial fibrillation: Secondary | ICD-10-CM | POA: Diagnosis not present

## 2020-02-07 DIAGNOSIS — N182 Chronic kidney disease, stage 2 (mild): Secondary | ICD-10-CM | POA: Diagnosis not present

## 2020-02-19 DIAGNOSIS — Z79899 Other long term (current) drug therapy: Secondary | ICD-10-CM | POA: Diagnosis not present

## 2020-02-19 DIAGNOSIS — I480 Paroxysmal atrial fibrillation: Secondary | ICD-10-CM | POA: Diagnosis not present

## 2020-02-19 DIAGNOSIS — Z86711 Personal history of pulmonary embolism: Secondary | ICD-10-CM | POA: Diagnosis not present

## 2020-02-19 DIAGNOSIS — R0600 Dyspnea, unspecified: Secondary | ICD-10-CM | POA: Diagnosis not present

## 2020-02-19 DIAGNOSIS — E785 Hyperlipidemia, unspecified: Secondary | ICD-10-CM | POA: Diagnosis not present

## 2020-02-19 DIAGNOSIS — I739 Peripheral vascular disease, unspecified: Secondary | ICD-10-CM | POA: Diagnosis not present

## 2020-02-20 DIAGNOSIS — E063 Autoimmune thyroiditis: Secondary | ICD-10-CM | POA: Diagnosis not present

## 2020-02-20 DIAGNOSIS — E039 Hypothyroidism, unspecified: Secondary | ICD-10-CM | POA: Diagnosis not present

## 2020-02-25 IMAGING — CT CT ANGIO CHEST
1 series · 19 of 32 positions shown · IV contrast (iopamidol)
Comparison: Chest CT 03/23/2017

CLINICAL DATA: Hypertrophic cardiomyopathy.

EXAM:
CT ANGIOGRAPHY CHEST WITH CONTRAST
TECHNIQUE: Multidetector CT imaging of the chest was performed using the
standard protocol during bolus administration of intravenous
contrast. Multiplanar CT image reconstructions and MIPs were
obtained to evaluate the vascular anatomy.
Labs: Creatinine 1.5, BUN 24, GFR 46
CONTRAST:  75mL [id number] IOPAMIDOL ([id number]) INJECTION 76%

[Series 4: chest angio · axial · 0.85mm/px · z∈[-411,-51]mm · 19 of 130 slices shown]
[im 5/130  lung]
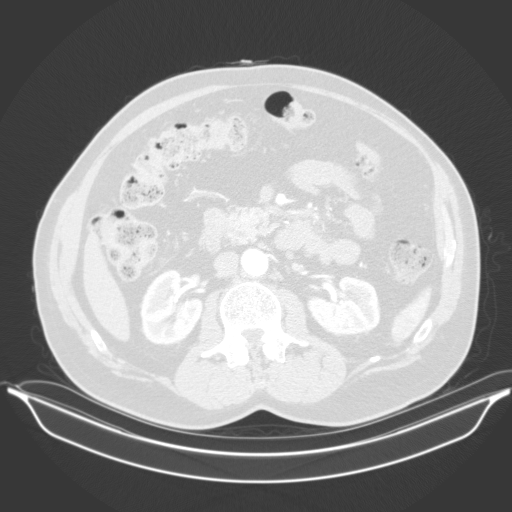
[im 13/130  soft-tissue]
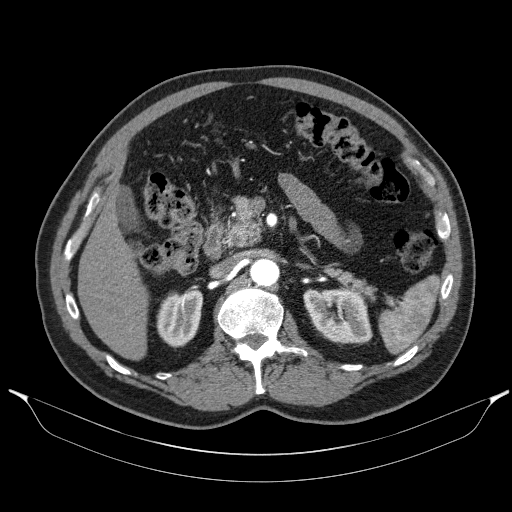
[im 17/130  lung]
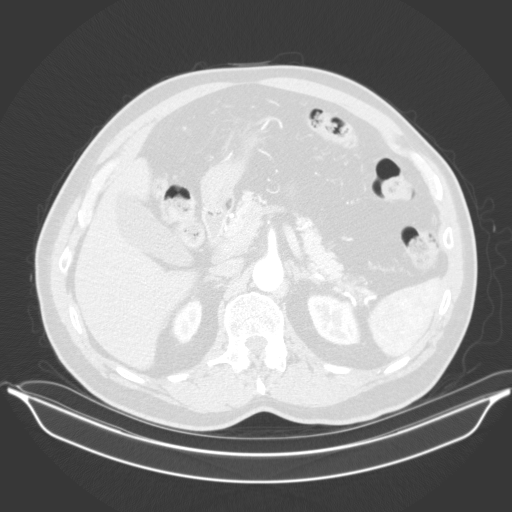
[im 25/130  soft-tissue]
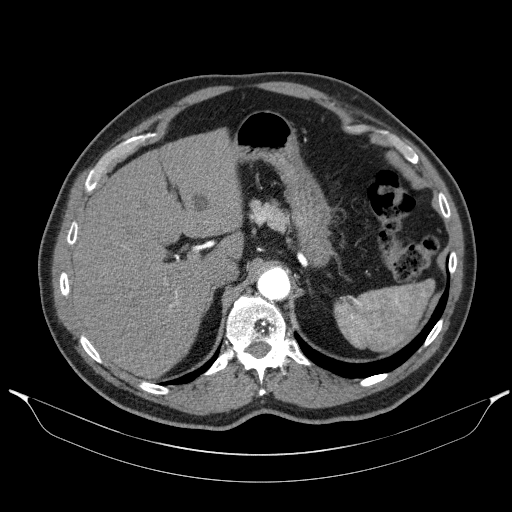
[im 34/130  lung]
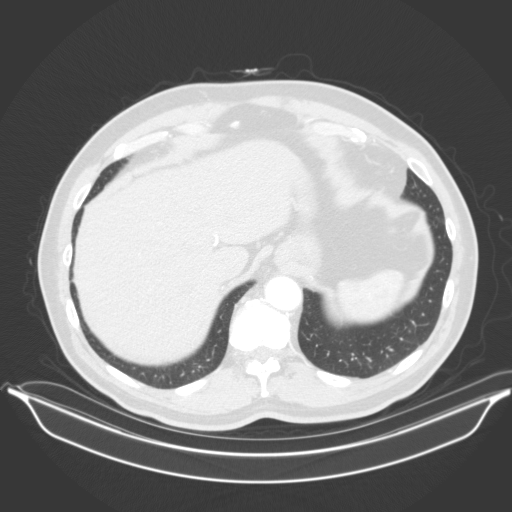
[im 38/130  soft-tissue]
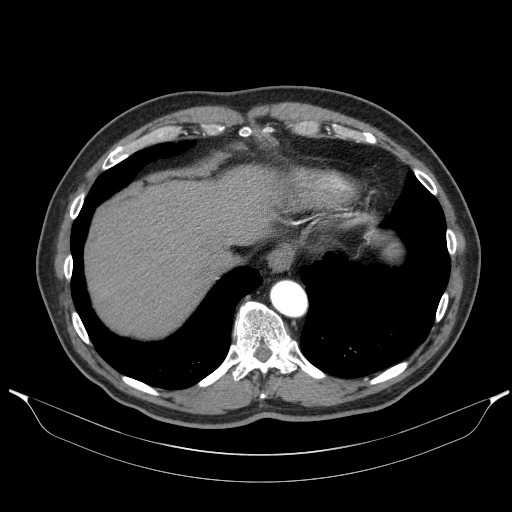
[im 46/130  lung]
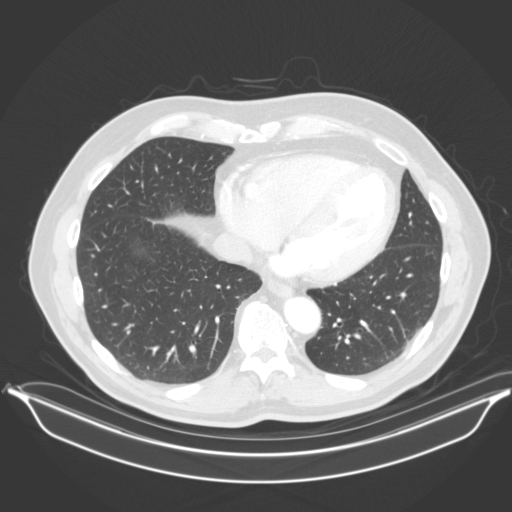
[im 50/130  soft-tissue]
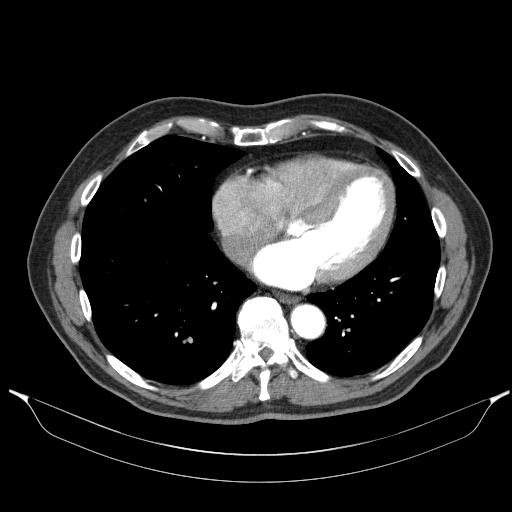
[im 59/130  lung]
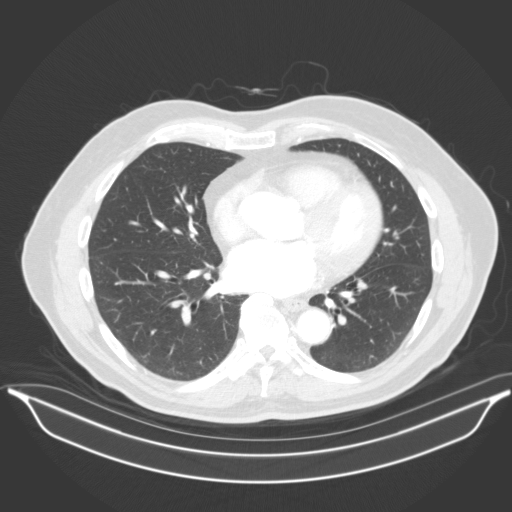
[im 67/130  soft-tissue]
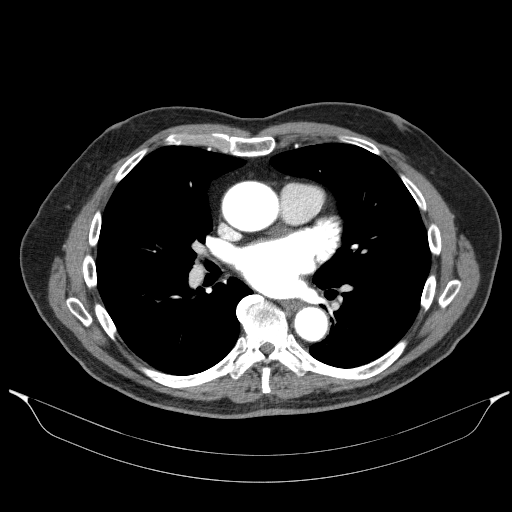
[im 71/130  lung]
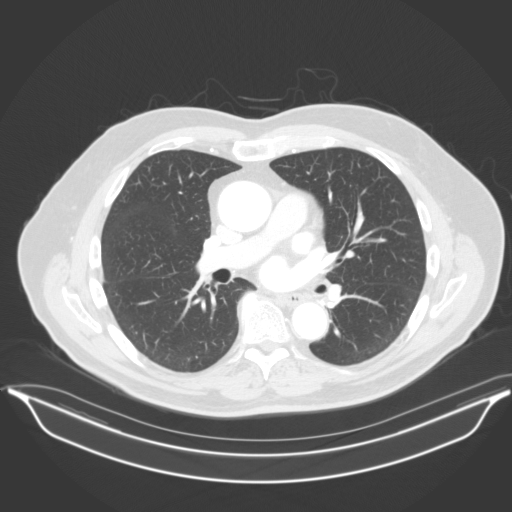
[im 80/130  soft-tissue]
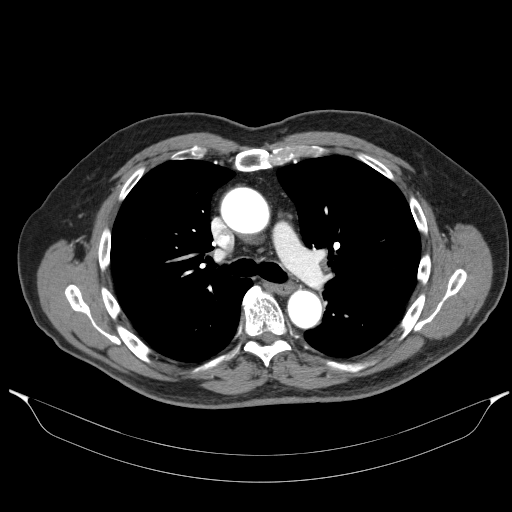
[im 84/130  lung]
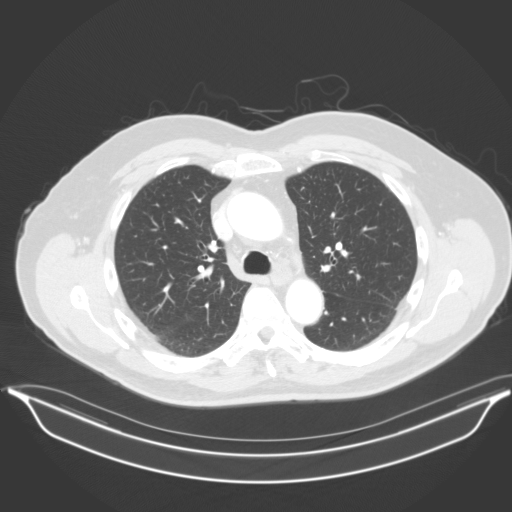
[im 92/130  soft-tissue]
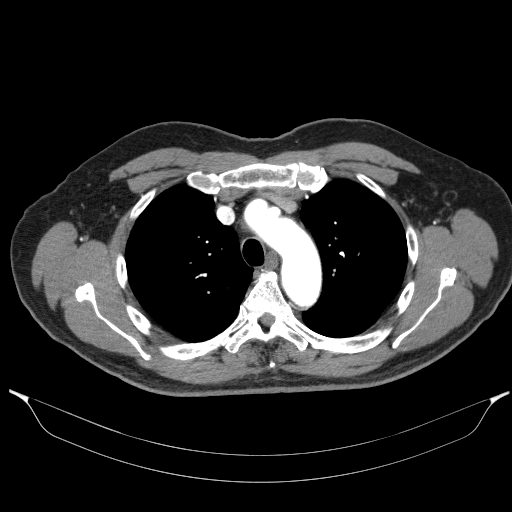
[im 96/130  lung]
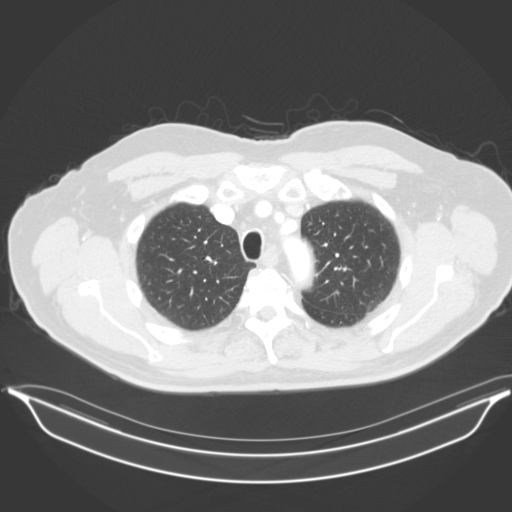
[im 105/130  soft-tissue]
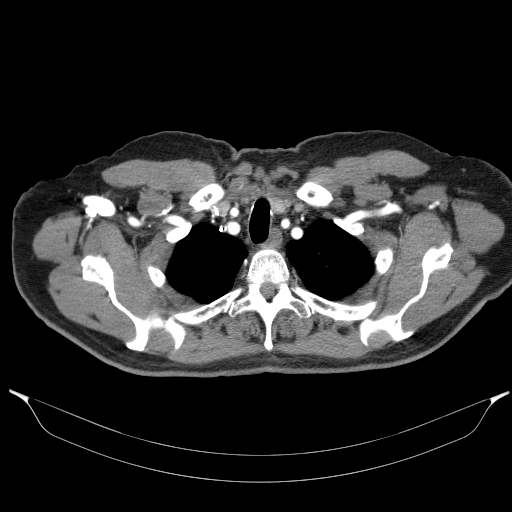
[im 113/130  lung]
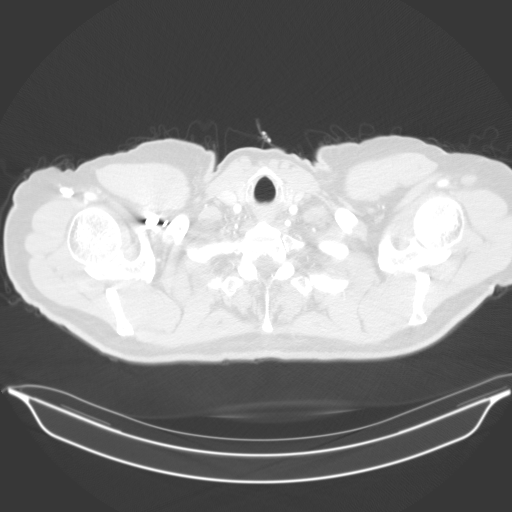
[im 117/130  soft-tissue]
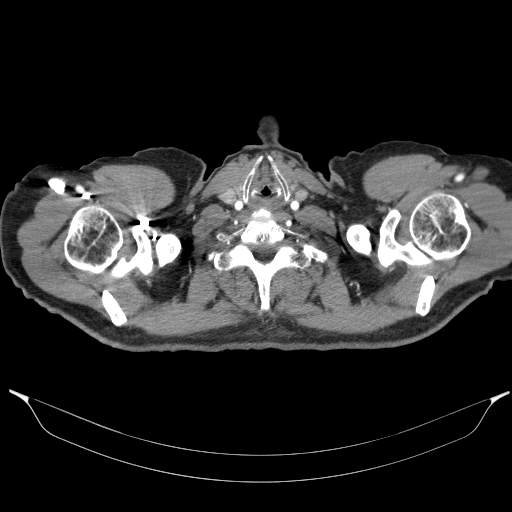
[im 125/130  lung]
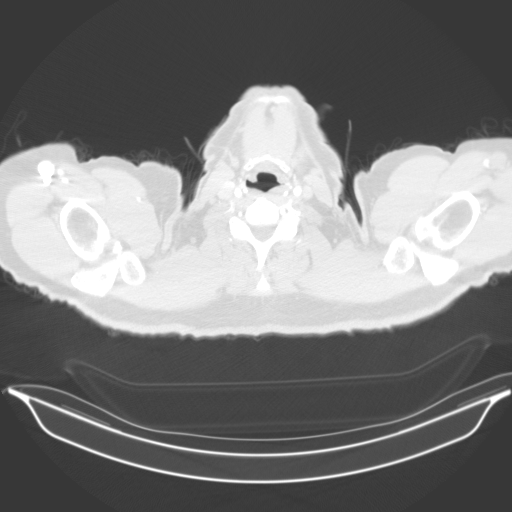

[19 of 32 positions shown; findings below may reference images not displayed]

FINDINGS: Cardiovascular: Preferential opacification of the thoracic aorta. No
evidence of thoracic aortic aneurysm or dissection. Normal heart
size. No pericardial effusion. Mild ectasia of the aorta. The mid
ascending aorta measures 4.2 cm, stable. The aortic caliber at the
level of the arch measures 3.4 cm. Mid descending aorta measures
cm.

Mediastinum/Nodes: No enlarged mediastinal, hilar, or axillary lymph
nodes. Thyroid gland, trachea, and esophagus demonstrate no
significant findings.

Lungs/Pleura: Right upper lobe pulmonary nodule measures 3.1 mm,
image 51/193, sequence 7. Lungs are otherwise clear. No pleural
effusion or pneumothorax.

Upper Abdomen: Scattered liver cysts.  Otherwise normal.

Musculoskeletal: No chest wall abnormality. No acute or significant
osseous findings.

Review of the MIP images confirms the above findings.
IMPRESSION: Mild ectasia of the thoracic aorta, with a mid ascending aorta
measuring 4.2 cm, stable. Recommend annual imaging followup by CTA
or MRA. This recommendation follows 3828
ACCF/AHA/AATS/ACR/ASA/SCA/GMD/MIIA-MARIKA/NAIK MOHAMMAD/SUAT Guidelines for the
Diagnosis and Management of Patients with Thoracic Aortic Disease.
Circulation. 3828; 121: [id number]

3.1 mm right upper lobe pulmonary nodule, relatively stable given
the differences in measurement techniques/slice selection. Attention
on future follow-up.

Aortic aneurysm NOS ([id number]).

## 2020-02-26 DIAGNOSIS — I421 Obstructive hypertrophic cardiomyopathy: Secondary | ICD-10-CM | POA: Diagnosis not present

## 2020-02-26 DIAGNOSIS — Z950 Presence of cardiac pacemaker: Secondary | ICD-10-CM | POA: Diagnosis not present

## 2020-02-26 DIAGNOSIS — Z9889 Other specified postprocedural states: Secondary | ICD-10-CM | POA: Diagnosis not present

## 2020-02-26 DIAGNOSIS — I712 Thoracic aortic aneurysm, without rupture: Secondary | ICD-10-CM | POA: Diagnosis not present

## 2020-03-04 DIAGNOSIS — D225 Melanocytic nevi of trunk: Secondary | ICD-10-CM | POA: Diagnosis not present

## 2020-03-04 DIAGNOSIS — L820 Inflamed seborrheic keratosis: Secondary | ICD-10-CM | POA: Diagnosis not present

## 2020-03-04 DIAGNOSIS — L821 Other seborrheic keratosis: Secondary | ICD-10-CM | POA: Diagnosis not present

## 2020-03-04 DIAGNOSIS — L570 Actinic keratosis: Secondary | ICD-10-CM | POA: Diagnosis not present

## 2020-03-04 DIAGNOSIS — Z85828 Personal history of other malignant neoplasm of skin: Secondary | ICD-10-CM | POA: Diagnosis not present

## 2020-03-06 NOTE — Progress Notes (Signed)
Date:  03/11/2020   ID:  Chase Hill, DOB 1946-04-27, MRN [id number]  PCP:  Jarome MatinPaterson, Daniel, MD  Cardiologist:  Kayler Rise SwazilandJordan, MD  Electrophysiologist:  None   Evaluation Performed:  Follow-Up Visit   Chief Complaint:  HOCM  History of Present Illness:    Chase Hill is a 74 y.o. male with PMH of hypertrophic cardiomyopathy, history of DVT/PE, TAA, hyperlipidemia, RBBB, Gilbert's syndrome, hypothyroidism.  He was previously followed by Dr. Shirlee LatchMcLean.  Stress Myoview in 2011 showed inferior and apical ischemia.  Subsequent cardiac cath showed no obstructive disease.  In 2014, echocardiogram demonstrated basal septal hypertrophy with mild LV outflow tract gradient and chordal versus valvular SAM, moderate diastolic dysfunction.  Cardiac MRI obtained in June 2014 showed mild focal basal septal hypertrophy with systolic anterior motion of the mitral valve and mild MR.  There was no significant delayed enhancement.  He has been intolerant of pravastatin due to myalgia.  He also developed  fatigue and orthostasis on beta-blocker.  Due to increased septal hypertrophy and outflow gradient on previous echo in July 2018, he was started on Cardizem.  This was later discontinued due to concern of conduction disease with first-degree AV block, left anterior fascicular block and a right bundle branch block. He later underwent laser ablation of varicose veins by Dr. Darrick PennaFields. He was diagnosed with PE on CT angiogram of the chest in January 2029 and was placed on Xarelto.  PE was felt to be provoked by laser vein treatment.  He completed 6 months of therapy with Xarelto and switch to aspirin.  CT angiogram of the chest recently showed 4.6 cm thoracic aortic aneurysm.  Recent lower extremity venous Doppler obtained on 04/10/2019 was negative for DVT.  Repeat echocardiogram obtained on 04/24/2019, EF was greater than 65%, severe LVH, significant outflow obstruction at the mid cavity and  LVOT with peak  gradient of at least 100 mmHg, systolic anterior motion of mitral leaflet, mild dilatation of the ascending aorta measuring 43 mm.   We did proceed with cardiac cath on 06/26/19. This showed normal coronary anatomy. Normal LV and right heart pressures. LVOT gradient 20 mm Hg by cath. MRI was also done showing typical features of HOCM with predominant sigmoid septum and SAM. There was only mild late gadolinium enhancement. An Event monitor was placed with results noted below.  [facility] was [facility] and underwent a Septal myectomy on 01/14/20 by Dr Maryanna ShapeSchaff. On 01/16/20 he had a left sided DDD pacemaker (Medtronic XT AZT MRI) placed for development of complete heart block. He did well and was DC on 01/20/20. He was readmitted 6/24-6/29/21 with acute pulmonary embolus. He had a 10 day history of increased SOB. Was noted on pacemaker check to have paroxysmal AFib. Was started on Eliquis but symptoms progressed and he presented to the ED. In the emergency room he remained hemodynamically stable. Labs revealed a hemoglobin 12.4, WBC 10.9, sodium 134, ALT 161, AST 130. Troponin returned at 36 with a negative delta of 3. NT proBNP returned at 3471. EKG showed atrial flutter. Chest x-ray showed minimal fibrosis/atelectasis in left lung base, otherwise clear. Chest CT showed acute multilobe for segmental and subsegmental thrombo emboli affecting all lobes. Chest CT and bedside echocardiogram did not reveal evidence of right heart strain. The patient received IV fluids, loading dose of heparin and was initiated on high-intensity heparin GTT. 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 The patient was transitioned to bivalirudin. Lupus anticoagulant was negative. Upon discharge the patient transition back to apixaban 10 mg b.i.d. x7 days and then 5 mg b.i.d. x3 months or indefinitely for atrial fibrillation. LE doppler  showed extensive DVT in left leg from the common femoral vein down.The patient's outpatient cardiologist, Dr. Gwendalyn Ege, was contacted regarding starting amiodarone while the patient was hospitalized. He recommended initiating amiodarone 200 mg b.i.d. x7 days followed by 200 mg daily with Cardiology follow up.  On follow up today he states that he is doing better every day. He can walk up stairs now without getting SOB. He still gets tired. He has a sharp pain in the left parasternal area. Pacer site is healing well. He notes some left leg pain with walking but is getting better. He notes some mild swelling in his left leg better with compression hose.    Past Medical History:  Diagnosis Date  . ALLERGIC RHINITIS 08/16/2009   Qualifier: Diagnosis of  By: Alwyn Ren MD, Chrissie Noa   Onset:as a child Character: perennial but increased seasonally Triggers: dust mites; minor as pollen Allergy testing: yes X 2; as child & in 1995, Dr Jethro Bolus. Never took shots Maintenance medications/ response:Loratidine daily Smoking history:never Family history pulmonary disease: no     . Cervical radiculopathy at C7    PMH of  . Chest pain 11/22/2012   Evaluated by Dr Marca Ancona, Cardiology 2014 ECHO, MR : ? hypertrophic cardiomyopathy Rx: Beta blocker   . Diverticulosis    colonoscopy 10-15-2006  . DIVERTICULOSIS, COLON 08/16/2009   Qualifier: Diagnosis of  By: Alwyn Ren MD, Chrissie Noa   Dr Jarold Motto, GI    . DVT (deep venous thrombosis) (HCC)   . ELECTROCARDIOGRAM, ABNORMAL 08/16/2009   Qualifier: Diagnosis of  By: Alwyn Ren MD, William   Right bundle branch block, left posterior fascicular block( bifascicular block), not present 01/17/2007; noted 08/16/2009    . GERD (gastroesophageal reflux disease)   . Gilbert syndrome   . GILBERT'S SYNDROME 01/14/2007   Qualifier: Diagnosis of  By: Alwyn Ren MD, Chrissie Noa    . Hemorrhoids   . Hyperlipidemia   . Hypothyroidism   . Obstructive hypertrophic cardiomyopathy (HCC) 01/15/2013  . Perennial  allergic rhinitis with seasonal variation   . Pulmonary embolism (HCC)   . REFLUX, ESOPHAGEAL 01/14/2007   Qualifier: Diagnosis of  By: Alwyn Ren MD, Chrissie Noa    . Thoracic aortic aneurysm without rupture (HCC) 03/20/2017  . THYROID FUNCTION TEST, ABNORMAL 09/07/2010   Qualifier: Diagnosis of  By: Alwyn Ren MD, Chrissie Noa     . Varicose veins of right lower extremity with complications 02/04/2018   Past Surgical History:  Procedure Laterality Date  . COLONOSCOPY  2010   Tics  . ENDOVENOUS ABLATION SAPHENOUS VEIN W/ LASER Right 08/21/2018   endovenous laser ablation right greater saphenous vein and stab phlebectomy > 20 incisions right leg by Fabienne Bruns MD   . RIGHT/LEFT HEART CATH AND CORONARY ANGIOGRAPHY N/A 06/26/2019   Procedure: RIGHT/LEFT HEART CATH AND CORONARY ANGIOGRAPHY;  Surgeon: [place], Draycen Leichter M, MD;  Location: [facility] INVASIVE CV LAB;  Service: Cardiovascular;  Laterality: N/A;  . WISDOM TOOTH EXTRACTION       Current Meds  Medication Sig  . amiodarone (PACERONE) 200 MG tablet Take 1 tablet by mouth daily.  . ASHWAGANDHA PO Take 1 capsule by mouth daily.  Marland Kitchen docusate sodium (COLACE) 100 MG capsule Take 200-300 mg by mouth 2 (two) times daily.  Marland Kitchen ELIQUIS 5  MG TABS tablet Take 5 mg by mouth 2 (two) times daily.  Marland Kitchen levothyroxine (SYNTHROID) 150 MCG tablet Take 150 mcg by mouth daily.  Marland Kitchen loratadine (CLARITIN) 10 MG tablet Take 10 mg by mouth daily.  . Melatonin 5 MG CAPS Take 5 mg by mouth at bedtime as needed (sleep).  . Multiple Vitamin (MULTIVITAMIN) tablet Take 1 tablet by mouth daily.    . Pseudoephedrine-APAP-DM (DAYQUIL PO) Take 2 capsules by mouth 2 (two) times daily as needed (cold symptoms).  . rosuvastatin (CRESTOR) 5 MG tablet Take 5 mg by mouth 3 (three) times a week.   Current Facility-Administered Medications for the 03/11/20 encounter (Office Visit) with [place], Vito Beg M, MD  Medication  . sodium chloride flush (NS) 0.9 % injection 3 mL     Allergies:   Patient has no known  allergies.   Social History   Tobacco Use  . Smoking status: Never Smoker  . Smokeless tobacco: Never Used  Vaping Use  . Vaping Use: Never used  Substance Use Topics  . Alcohol use: Yes    Alcohol/week: 2.0 standard drinks    Types: 2 Glasses of wine per week    Comment:  < 3 /week , only socially  . Drug use: No     Family Hx: The patient's family history includes Breast cancer in his mother; Congestive Heart Failure in his father; Coronary artery disease in his father and paternal aunt; Stroke (age of onset: 66) in his maternal grandmother. There is no history of Diabetes, Hypertension, or Colon cancer.  ROS:   Please see the history of present illness.    All other systems reviewed and are negative.   Prior CV studies:   The following studies were reviewed today:  CTA of chest 02/19/2019 IMPRESSION: 1. No change in caliber of the tubular ascending thoracic aorta, measuring 4.6 x 4.3 cm, not significantly changed in caliber, measuring 4.5 x 4.2 cm on examinations dating back to 03/23/2017. The sinuses of Valsalva measure up to 4.4 cm in caliber. Normal caliber of the aortic valve. The descending thoracic aorta is normal in caliber. No significant atherosclerosis.  2. Examination is not tailored for the evaluation of pulmonary embolism, however there is no obvious residual pulmonary embolism in the right lower lobe at the site of embolism seen on prior examination dated 09/11/2018.  3. Stable, benign small pulmonary nodules.   Venous doppler 04/10/2019 Summary: Right: Successful vein closure. There is no evidence of deep vein thrombosis in the lower extremity. No cystic structure found in the popliteal fossa. No significant change compared to previous study. Left: No evidence of common femoral vein obstruction.  *See table(s) above for measurements and observations.   Echo 04/24/2019 IMPRESSIONS 1. The left ventricle has hyperdynamic systolic function,  with an ejection fraction of >65%. The cavity size was normal. There is severely increased left ventricular wall thickness. Left ventricular diastolic Doppler parameters are consistent with  impaired relaxation. 2. LVOT is narrowed The LV is severely hypertrophied. There is significant outflow obstruction at mid cavity and through LVOT with peak gradient at least 100 mm Hg. All consistent with HOCM. 3. The right ventricle has normal systolic function. The cavity was normal. There is no increase in right ventricular wall thickness. 4. Left atrial size was mildly dilated. 5. There is systolic anterior motion of mitral leaflets. 6. The aortic valve is tricuspid. Mild thickening of the aortic valve. Aortic valve regurgitation is mild by color flow Doppler. 7. The aorta is  abnormal unless otherwise noted. 8. The ascending aorta is normal in size and structure. 9. There is mild dilatation of the ascending aorta measuring 43 mm.   Cardiac cath 06/26/19:  RIGHT/LEFT HEART CATH AND CORONARY ANGIOGRAPHY  Conclusion    The left ventricular systolic function is normal.  LV end diastolic pressure is normal.  The left ventricular ejection fraction is 55-65% by visual estimate.   1. Normal coronary anatomy 2. Normal LV function' 3. 20 mm Hg LVOT gradient at rest. Typical Brockenbrough sign post PVC. 4. Normal/low LV filling pressures 5. Normal right  Heart pressures 6. Normal cardiac output 7. Moderate MR but post PVC  Plan: recommend further evaluation with cardiac MRI and event monitor.     CLINICAL DATA:  Hypertrophic cardiomyopathy suspected, further testing  COMPARISON: No images available from prior cardiac MRI for comparison  EXAM: CARDIAC MRI  TECHNIQUE: The patient was scanned on a 1.5 Tesla GE magnet. A dedicated cardiac coil was used. Functional imaging was done using Fiesta sequences. 2,3, and 4 chamber views were done to assess for RWMA's. Modified  Simpson's rule using a short axis stack was used to calculate an ejection fraction on a dedicated work [employer]. The patient received 59mL GADAVIST GADOBUTROL 1 MMOL/ML IV SOLN. After 10 minutes inversion recovery sequences were used to assess for infiltration and scar tissue.  CONTRAST:  9mL GADAVIST GADOBUTROL 1 MMOL/ML IV SOLN  FINDINGS: LEFT VENTRICLE:  Normal left ventricular size. Hyperdynamic systolic function (LVEF = 69%).  There are no regional wall motion abnormalities.  Late gadolinium enhancement in the left ventricular myocardium is seen in the inferior RV insertion point at the mid ventricle and apex. This finding is seen in an area of increased myocardial wall thickness. Differential diagnosis includes fibrosis secondary to hypertrophic cardiomyopathy vs increased pulmonary pressures.  Normal T1 myocardial nulling kinetics suggest against a diagnosis of cardiac amyloidosis.  Maximal wall thickness: 18 mm  Location: Basal septum.  There is systolic anterior motion of the mitral valve. Flow dephasing suggests left ventricular outflow tract obstruction.  Mitral regurgitation is present and posteriorly directed.  No evidence of left ventricular apical aneurysm.  Findings are consistent with hypertrophic cardiomyopathy with obstruction. Morphologic subtype: Sigmoid subtype.  RIGHT VENTRICLE:  Normal right ventricular size, thickness and systolic function (RVEF = 63%).  There are no regional wall motion abnormalities.  No delayed myocardial enhancement in the right ventricle.  ATRIA:  Mildly dilated left atrial chamber size.  normal right atrial size.  VALVES:  Systolic anterior motion of the mitral valve with posteriorly directed mitral regurgitation, qualitatively mild.  Qualitatively trivial aortic valve regurgitation by flow dephasing.  PERICARDIUM:  Normal pericardium.  No pericardial  effusion.  AORTA:  Mid ascending aorta measures 43 mm in a sagittal transverse plane. Orthogonal measurements unable to be performed for true axial measurement.  OTHER:  MEASUREMENTS:  LVEDV: 145 ml  LVESV: 45 ml  SV: 100 ml  CO: 6 L/min  Myocardial mass: 184 g  RVEDV: 100 ml  RVEDS: 38 ml  RVSV: 63 mL  IMPRESSION: 1.  Hyperdynamic left ventricular systolic function.  LVEF 69%.  2.  Normal right ventricular chamber size and function.  RVEF 63%.  3. Findings consistent with hypertrophic cardiomyopathy, sigmoid subtype, maximal wall thickness 18 mm at the basal left ventricular septum.  4. Left ventricular outflow tract obstruction. Flow dephasing seen in the LVOT with systolic anterior motion of the mitral valve, and posteriorly directed mitral valve regurgitation.  5. Delayed myocardial enhancement is seen in an area of increased left ventricular wall thickness at the mid ventricle, at the inferior RV insertion point. This finding could represent fibrosis in the setting hypertrophic cardiomyopathy vs. increased pulmonary pressures.  6.  Mid ascending aorta measures 43 mm in a transverse plane.   Electronically Signed   By: Weston Brass  Event monitor 08/19/19.Study Highlights   Normal sinus rhythm with first degree AV block  Episodes of second degree AV block Mobitz type 1  PVCs. One run of AIVR  PACs with short bursts of PACs- longest 6 beats.  No Afib noted- episodes noted on monitor appear to be artifact.   Echo 01/19/20:Echocardiogram 01/19/2020 Final Impressions 1. Hypertrophic cardiomyopathy status post left ventricular septal myectomy (14-Jan-2020). 2. Systolic anterior motion of mitral apparatus with trivial mitral valve regurgitation. No dynamic left ventricular outflow tract obstruction at rest. 3. Mild aortic valve regurgitation (central jet). ERO 0.07 cm2, regurgitant volume 19 mL. 4. Normal left ventricular chamber  size, calculated 2-D linear ejection fraction 70 %. 5. Normal right ventricular chamber size, mildly reduced systolic function, estimated right ventricular systolic pressure 22 mmHg (systolic blood pressure 129 mmHg). 6. Mildly enlarged mid ascending aorta diameter (diameter 45 mm at mid level). Upper limits of normal for age, sex, and BSA = 43 mm. 7. No pericardial effusion.    Echo 01/19/20: Final Impressions  1. Hypertrophic cardiomyopathy status post left ventricular septal myectomy (14-Jan-2020).  2. Systolic anterior motion of mitral apparatus with trivial mitral valve regurgitation. No  dynamic left ventricular outflow tract obstruction at rest.  3. Mild aortic valve regurgitation (central jet). ERO 0.07 cm^2, regurgitant volume 19 mL.  4. Normal left ventricular chamber size, calculated 2-D linear ejection fraction 70 %.  5. Normal right ventricular chamber size, mildly reduced systolic function, estimated right  ventricular systolic pressure 22 mmHg (systolic blood pressure 129 mmHg).  6. Mildly enlarged mid ascending aorta diameter (diameter 45 mm at mid level). Upper limits of  normal for age, sex, and BSA = 43 mm.  7. No pericardial effusion.   Labs/Other Tests and Data Reviewed:    EKG:  No ECG reviewed.  Recent Labs: 06/23/2019: ALT 19; BUN 18; Creatinine, Ser 1.24; Platelets 208 06/26/2019: Hemoglobin 14.3; Potassium 4.2; Sodium 140   Recent Lipid Panel Lab Results  Component Value Date/Time   CHOL 182 06/23/2019 09:19 AM   CHOL 202 (H) 04/29/2015 07:34 AM   TRIG 180 (H) 06/23/2019 09:19 AM   TRIG 165 (H) 04/29/2015 07:34 AM   HDL 51 06/23/2019 09:19 AM   HDL 47 04/29/2015 07:34 AM   CHOLHDL 3.6 06/23/2019 09:19 AM   CHOLHDL 3 08/24/2016 07:27 AM   LDLCALC 100 (H) 06/23/2019 09:19 AM   LDLCALC 122 (H) 04/29/2015 07:34 AM   LDLDIRECT 78.7 03/28/2013 07:32 AM   Dated 08/12/19: creatinine 1.2. otherwise CMET normal. CBC and TSH normal. Cholesterol 177, triglycerides  209, HDL 43, LDL 92.  Dated 02/19/20: Hgb 12.9, creatinine 1.3. TSH 5.22. ALT 25.  Ecg today A sensed and V paced at 67 bpm. I have personally reviewed and interpreted this study.   Wt Readings from Last 3 Encounters:  03/11/20 (!) 204 lb (92.5 kg)  10/14/19 215 lb (97.5 kg)  08/19/19 213 lb 3.2 oz (96.7 kg)     Objective:    Vital Signs:  BP 92/66 (BP Location: Right Arm, Patient Position: Sitting, Cuff Size: Normal)   Pulse 67   Ht 5\' 11"  (1.803  m)   Wt (!) 204 lb (92.5 kg)   SpO2 97%   BMI 28.45 kg/m    GENERAL:  Well appearing WM in NAD HEENT:  PERRL, EOMI, sclera are clear. Oropharynx is clear. NECK:  No jugular venous distention, carotid upstroke brisk and symmetric, no bruits, no thyromegaly or adenopathy LUNGS:  Clear to auscultation bilaterally CHEST:  Unremarkable HEART:  RRR,  PMI not displaced or sustained,S1 and S2 within normal limits, no S3, no S4: no clicks, no rubs, no murmurs. Sternal incision is healing well.  ABD:  Soft, nontender. BS +, no masses or bruits. No hepatomegaly, no splenomegaly EXT:  2 + pulses throughout, no edema, no cyanosis no clubbing SKIN:  Warm and dry.  No rashes NEURO:  Alert and oriented x 3. Cranial nerves II through XII intact. PSYCH:  Cognitively intact   ASSESSMENT & PLAN:    1. Hypertrophic cardiomyopathy:  with marked increase in LVOT gradient 100 mm Hg this year. Symptom of dyspnea on exertion and dizziness with exertion.  Intolerant of beta blocker in the past due to dizziness. Now s/p septal Myectomy at [facility]. S/p DDDR pacemaker for CHB which was anticipated. Course complicated by development of large Left leg DVT and pulmonary embolus. This was associated with paroxysmal AFib/flutter and her was placed on amiodarone. Now on Eliquis. Clinically better post myectomy with improved DOE. Echo post op at [facility] on 01/19/20 showed no outflow gradient and trivial MR. Normal EF. Will refer to cardiac Rehab.    2. Thoracic aortic  aneurysm: Mildly dilated aorta on recent echocardiogram measuring up to 43 mm. Follow up yearly.   3. Hyperlipidemia: Continue Crestor.  4. Hypothyroidism: On Synthroid. TSH OK on amiodarone.  5. History of PE past- more recent DVT/ PE post op heart surgery. Will repeat venous doppler. If he still has extensive thrombus may consider thrombectomy with VVS- if improving we will continue anticoagulation.  6. Second degree AV block Mobitz type 1 progressing to CHB post septal myectomy. S/p DDDR pacemaker with Medtronic generator. Pacer check on 6/29 was OK. Pacer site is healing well.   7. Paroxysmal Afib/flutter. Post op heart surgery and in setting of acute PE. Now on amiodarone 200 mg daily.  If no recurrent Afib noted on pacemaker I would like to stop amiodarone since AFib likely triggered by PE.   Medication Adjustments/Labs and Tests Ordered: Current medicines are reviewed at length with the patient today.  Concerns regarding medicines are outlined above.   Tests Ordered: No orders of the defined types were placed in this encounter.   Medication Changes: No orders of the defined types were placed in this encounter.   Follow Up: 2 months  Signed, Dalynn Jhaveri [place], MD  03/11/2020 9:42 AM    [facility]

## 2020-03-11 ENCOUNTER — Other Ambulatory Visit: Payer: Self-pay

## 2020-03-11 ENCOUNTER — Encounter: Payer: Self-pay | Admitting: Cardiology

## 2020-03-11 ENCOUNTER — Ambulatory Visit (INDEPENDENT_AMBULATORY_CARE_PROVIDER_SITE_OTHER): Payer: BC Managed Care – PPO | Admitting: Cardiology

## 2020-03-11 VITALS — BP 92/66 | HR 67 | Ht 71.0 in | Wt 204.0 lb

## 2020-03-11 DIAGNOSIS — I824Y9 Acute embolism and thrombosis of unspecified deep veins of unspecified proximal lower extremity: Secondary | ICD-10-CM

## 2020-03-11 DIAGNOSIS — I421 Obstructive hypertrophic cardiomyopathy: Secondary | ICD-10-CM

## 2020-03-11 DIAGNOSIS — I441 Atrioventricular block, second degree: Secondary | ICD-10-CM

## 2020-03-11 DIAGNOSIS — Z86711 Personal history of pulmonary embolism: Secondary | ICD-10-CM | POA: Diagnosis not present

## 2020-03-11 DIAGNOSIS — I480 Paroxysmal atrial fibrillation: Secondary | ICD-10-CM

## 2020-03-11 NOTE — Patient Instructions (Addendum)
Medication Instructions:  Continue same medications *If you need a refill on your cardiac medications before your next appointment, please call your pharmacy*   Lab Work: Cbc,cmet,tsh today    Testing/Procedures: Schedule Left lower ext venous doppler    Follow-Up: At [facility], you and your health needs are our priority.  As part of our continuing mission to provide you with exceptional heart care, we have created designated Provider Care Teams.  These Care Teams include your primary Cardiologist (physician) and Advanced Practice Providers (APPs -  Physician Assistants and Nurse Practitioners) who all work together to provide you with the care you need, when you need it.  We recommend signing up for the patient portal called "MyChart".  Sign up information is provided on this After Visit Summary.  MyChart is used to connect with patients for Virtual Visits (Telemedicine).  Patients are able to view lab/test results, encounter notes, upcoming appointments, etc.  Non-urgent messages can be sent to your provider as well.   To learn more about what you can do with MyChart, go to [email or web address].    Your next appointment:  Wed 05/12/20 at 11:40 am   The format for your next appointment: Office     Provider: Dr.Jordan    Dr.Cameron [address] office   Wed 03/24/20 at 11:40 am   [facility] will call with appointment

## 2020-03-16 ENCOUNTER — Telehealth (HOSPITAL_COMMUNITY): Payer: Self-pay

## 2020-03-16 ENCOUNTER — Telehealth: Payer: Self-pay | Admitting: Cardiology

## 2020-03-16 NOTE — Telephone Encounter (Signed)
Called patient- he states that he was told to start Cardiac Rehab but when he got a call they can not get him in until September 3rd, which is weeks away.  He has a home in [place] and they do Cardiac Rehab there and would like to go there to have this done as they can get him in ASAP. The name is [facility]- fax number [phone], and he would like for them to call him at [phone] when they receive the information.   Advised patient I would send over to Dr.Jordan and Elnita Maxwell to have it reviewed and if okay sent over.  Patient verbalized understanding.

## 2020-03-16 NOTE — Telephone Encounter (Signed)
Called and spoke with pt in regards to CR, pt stated he would like to contact a rehab close to him. To see if they are able to get him in soon, pt stated he will contact us. If pt does not, we will follow up with him in a week.

## 2020-03-16 NOTE — Telephone Encounter (Signed)
Pt is calling and would like to talk with Dr. Elvis Coil nurse regarding appt. Please call back

## 2020-03-17 NOTE — Telephone Encounter (Signed)
Nurse with [facility] called to follow up. She states prior to scheduling an official referral needs to be faxed to their office. She states the patient can be seen as soon as tomorrow, 03/18/20, if the referral is sent in as soon as possible. The referral may be faxed to [phone].

## 2020-03-17 NOTE — Telephone Encounter (Signed)
Cardiac rehab referral faxed to [facility] at fax # [phone].

## 2020-03-19 ENCOUNTER — Telehealth (HOSPITAL_COMMUNITY): Payer: Self-pay

## 2020-03-19 DIAGNOSIS — E039 Hypothyroidism, unspecified: Secondary | ICD-10-CM | POA: Diagnosis not present

## 2020-03-19 NOTE — Telephone Encounter (Signed)
Pt called and stated he will be going to another location.  Closed referral

## 2020-03-22 ENCOUNTER — Ambulatory Visit (HOSPITAL_COMMUNITY): Payer: BC Managed Care – PPO

## 2020-03-23 ENCOUNTER — Other Ambulatory Visit: Payer: Self-pay

## 2020-03-23 ENCOUNTER — Ambulatory Visit (HOSPITAL_COMMUNITY)
Admission: RE | Admit: 2020-03-23 | Discharge: 2020-03-23 | Disposition: A | Discharge status: Home / Self Care | Payer: BC Managed Care – PPO | Source: Ambulatory Visit | Attending: Cardiovascular Disease | Admitting: Cardiovascular Disease

## 2020-03-23 DIAGNOSIS — I441 Atrioventricular block, second degree: Secondary | ICD-10-CM | POA: Diagnosis not present

## 2020-03-23 DIAGNOSIS — I421 Obstructive hypertrophic cardiomyopathy: Secondary | ICD-10-CM

## 2020-03-23 DIAGNOSIS — I824Y9 Acute embolism and thrombosis of unspecified deep veins of unspecified proximal lower extremity: Secondary | ICD-10-CM | POA: Diagnosis not present

## 2020-03-23 DIAGNOSIS — I480 Paroxysmal atrial fibrillation: Secondary | ICD-10-CM | POA: Diagnosis not present

## 2020-03-23 DIAGNOSIS — Z86711 Personal history of pulmonary embolism: Secondary | ICD-10-CM | POA: Diagnosis not present

## 2020-03-23 NOTE — H&P (View-Only) (Signed)
Cardiology Office Note:    Date:  03/24/2020   ID:  KIING DEAKIN, DOB Aug 22, 1945, MRN [id number]  PCP:  [name], MD  [facility] Cardiologist:  Peter Swaziland, MD  [facility] Electrophysiologist:  Lanier Prude, MD  Referring MD: [name], MD   No chief complaint on file. Establish care for pacemaker, atrial fibrillation post operative   History of Present Illness:    Chase Hill is a 74 y.o. male with a hx of hypertrophic cardiomyopathy treated with myectomy at [facility].  His recent surgery at [facility] was complicated by atrial fibrillation, pulmonary embolism, pacemaker implantation.  He was recently seen virtually by the cardiac surgery team on February 26, 2020 and presents to clinic today to establish care for his pacemaker.  For his atrial fibrillation, he is maintained on amiodarone 200 mg daily and Eliquis twice daily.  Past Medical History:  Diagnosis Date  . ALLERGIC RHINITIS 08/16/2009   Qualifier: Diagnosis of  By: Alwyn Ren MD, Chrissie Noa   Onset:as a child Character: perennial but increased seasonally Triggers: dust mites; minor as pollen Allergy testing: yes X 2; as child & in 1995, Dr Jethro Bolus. Never took shots Maintenance medications/ response:Loratidine daily Smoking history:never Family history pulmonary disease: no     . Cervical radiculopathy at C7    PMH of  . Chest pain 11/22/2012   Evaluated by Dr Marca Ancona, Cardiology 2014 ECHO, MR : ? hypertrophic cardiomyopathy Rx: Beta blocker   . Diverticulosis    colonoscopy 10-15-2006  . DIVERTICULOSIS, COLON 08/16/2009   Qualifier: Diagnosis of  By: Alwyn Ren MD, Chrissie Noa   Dr Jarold Motto, GI    . DVT (deep venous thrombosis) (HCC)   . ELECTROCARDIOGRAM, ABNORMAL 08/16/2009   Qualifier: Diagnosis of  By: Alwyn Ren MD, William   Right bundle branch block, left posterior fascicular block( bifascicular block), not present 01/17/2007; noted 08/16/2009    . GERD (gastroesophageal reflux disease)   .  Gilbert syndrome   . GILBERT'S SYNDROME 01/14/2007   Qualifier: Diagnosis of  By: Alwyn Ren MD, Chrissie Noa    . Hemorrhoids   . Hyperlipidemia   . Hypothyroidism   . Obstructive hypertrophic cardiomyopathy (HCC) 01/15/2013  . Perennial allergic rhinitis with seasonal variation   . Pulmonary embolism (HCC)   . REFLUX, ESOPHAGEAL 01/14/2007   Qualifier: Diagnosis of  By: Alwyn Ren MD, Chrissie Noa    . Thoracic aortic aneurysm without rupture (HCC) 03/20/2017  . THYROID FUNCTION TEST, ABNORMAL 09/07/2010   Qualifier: Diagnosis of  By: Alwyn Ren MD, Chrissie Noa     . Varicose veins of right lower extremity with complications 02/04/2018    Past Surgical History:  Procedure Laterality Date  . COLONOSCOPY  2010   Tics  . ENDOVENOUS ABLATION SAPHENOUS VEIN W/ LASER Right 08/21/2018   endovenous laser ablation right greater saphenous vein and stab phlebectomy > 20 incisions right leg by Fabienne Bruns MD   . RIGHT/LEFT HEART CATH AND CORONARY ANGIOGRAPHY N/A 06/26/2019   Procedure: RIGHT/LEFT HEART CATH AND CORONARY ANGIOGRAPHY;  Surgeon: Swaziland, Peter M, MD;  Location: [facility] INVASIVE CV LAB;  Service: Cardiovascular;  Laterality: N/A;  . WISDOM TOOTH EXTRACTION      Current Medications: Current Meds  Medication Sig  . amiodarone (PACERONE) 200 MG tablet Take 1 tablet by mouth daily.  . ASHWAGANDHA PO Take 1 capsule by mouth daily.  Marland Kitchen docusate sodium (COLACE) 100 MG capsule Take 200-300 mg by mouth 2 (two) times daily.  Marland Kitchen ELIQUIS 5 MG TABS tablet Take  5 mg by mouth 2 (two) times daily.  . levothyroxine (SYNTHROID) 150 MCG tablet Take 150 mcg by mouth daily.  . loratadine (CLARITIN) 10 MG tablet Take 10 mg by mouth daily.  . Melatonin 5 MG CAPS Take 5 mg by mouth at bedtime as needed (sleep).  . Multiple Vitamin (MULTIVITAMIN) tablet Take 1 tablet by mouth daily.    . Pseudoephedrine-APAP-DM (DAYQUIL PO) Take 2 capsules by mouth 2 (two) times daily as needed (cold symptoms).  . rosuvastatin (CRESTOR) 5 MG tablet Take 5  mg by mouth 3 (three) times a week.   Current Facility-Administered Medications for the 03/24/20 encounter (Office Visit) with Mandisa Persinger T, MD  Medication  . sodium chloride flush (NS) 0.9 % injection 3 mL     Allergies:   Patient has no known allergies.   Social History   Socioeconomic History  . Marital status: Married    Spouse name: Not on file  . Number of children: 2  . Years of education: Not on file  . Highest education level: Not on file  Occupational History  . Occupation: LAWYER  Tobacco Use  . Smoking status: Never Smoker  . Smokeless tobacco: Never Used  Vaping Use  . Vaping Use: Never used  Substance and Sexual Activity  . Alcohol use: Yes    Alcohol/week: 2.0 standard drinks    Types: 2 Glasses of wine per week    Comment:  < 3 /week , only socially  . Drug use: No  . Sexual activity: Not on file  Other Topics Concern  . Not on file  Social History Narrative   Daily caffeine    Social Determinants of Health   Financial Resource Strain:   . Difficulty of Paying Living Expenses:   Food Insecurity:   . Worried About Running Out of Food in the Last Year:   . Ran Out of Food in the Last Year:   Transportation Needs:   . Lack of Transportation (Medical):   . Lack of Transportation (Non-Medical):   Physical Activity:   . Days of Exercise per Week:   . Minutes of Exercise per Session:   Stress:   . Feeling of Stress :   Social Connections:   . Frequency of Communication with Friends and Family:   . Frequency of Social Gatherings with Friends and Family:   . Attends Religious Services:   . Active Member of Clubs or Organizations:   . Attends Club or Organization Meetings:   . Marital Status:      Family History: The patient's family history includes Breast cancer in his mother; Congestive Heart Failure in his father; Coronary artery disease in his father and paternal aunt; Stroke (age of onset: 89) in his maternal grandmother. There is no  history of Diabetes, Hypertension, or Colon cancer.  ROS:   Please see the history of present illness.     All other systems reviewed and are negative.  EKGs/Labs/Other Studies Reviewed:    The following studies were reviewed today: Device interrogation, EKG.  EKG:  EKG is ordered today.  The ekg ordered today demonstrates atrial fibrillation and RV pacing.   Recent Labs: 06/23/2019: ALT 19; BUN 18; Creatinine, Ser 1.24; Platelets 208 06/26/2019: Hemoglobin 14.3; Potassium 4.2; Sodium 140  Recent Lipid Panel    Component Value Date/Time   CHOL 182 06/23/2019 0919   CHOL 202 (H) 04/29/2015 0734   TRIG 180 (H) 06/23/2019 0919   TRIG 165 (H) 04/29/2015 0734   HDL   51 06/23/2019 0919   HDL 47 04/29/2015 0734   CHOLHDL 3.6 06/23/2019 0919   CHOLHDL 3 08/24/2016 0727   VLDL 21.6 08/24/2016 0727   LDLCALC 100 (H) 06/23/2019 0919   LDLCALC 122 (H) 04/29/2015 0734   LDLDIRECT 78.7 03/28/2013 0732    Physical Exam:    VS:  BP 112/84   Pulse 83   Ht 5\' 11"  (1.803 m)   Wt 206 lb (93.4 kg)   SpO2 98%   BMI 28.73 kg/m     Wt Readings from Last 3 Encounters:  03/24/20 206 lb (93.4 kg)  03/11/20 (!) 204 lb (92.5 kg)  10/14/19 215 lb (97.5 kg)     GEN:  Well nourished, well developed in no acute distress HEENT: Normal NECK: No JVD; No carotid bruits LYMPHATICS: No lymphadenopathy CARDIAC: RRR (c/w afib with v pacing), warm well perfused. Chest wall over pacemaker well healed without tenderness over pacemaker generator. RESPIRATORY:  No IWOB. No wheezing. ABDOMEN: Soft, non-tender, non-distended MUSCULOSKELETAL:  No edema; No deformity  SKIN: Warm and dry NEUROLOGIC:  Alert and oriented x 3 PSYCHIATRIC:  Normal affect   ASSESSMENT:    1. Obstructive hypertrophic cardiomyopathy (HCC)   2. Cardiac pacemaker in situ   3. Heart block AV complete (HCC)    PLAN:    In order of problems listed above:  1. Hypertrophic obstructive cardiomyopathy status post myectomy  complicated by complete heart block requiring pacemaker implantation and paroxysmal atrial fibrillation.  Mr. Chaloux is doing well postoperatively.  His sternotomy and pacemaker incisions are healing well.  He is on a good medical regimen including apixaban and amiodarone.  2. Paroxysmal atrial fibrillation, postop.  He is now 9 weeks post op but has been in afib 100% of the time since discharge. Will increase amiodarone to 400mg  daily for the next 7 days before decreasing again to 200mg  daily. Will plan to see him back in 2 months to reassess rhythm. If he is in atrial fibrillation, will consider trial of cardioversion to restore normal rhythm. 3. DVT. He is on Eliquis. This most recent episode post op from myectomy was his second episode of DVT. The first episode was after a lower extremity procedure. Both episodes resulted in symptomatic PE.    Medication Adjustments/Labs and Tests Ordered: Current medicines are reviewed at length with the patient today.  Concerns regarding medicines are outlined above.  Orders Placed This Encounter  Procedures  . EKG 12-Lead   No orders of the defined types were placed in this encounter.   Patient Instructions  Medication Instructions:  Your physician has recommended you make the following change in your medication:  1.  Increase your amiodarone 200 mg-  Take one tablet by mouth twice a day for 7 days.  After 7 days go back to one tablet by mouth daily  Labwork: None ordered.  Testing/Procedures: None ordered.  Follow-Up: Your physician wants you to follow-up in: 2 months with Dr. Mayford Knife  May 26, 2020 at 2:50 pm at the [facility] office.     Remote monitoring is used to monitor your Pacemaker from home. This monitoring reduces the number of office visits required to check your device to one time per year. It allows [employer] to keep an eye on the functioning of your device to ensure it is working properly. You are scheduled for a device check from  home on 06/23/2020. You may send your transmission at any time that day. If you have a wireless device, the  transmission will be sent automatically. After your physician reviews your transmission, you will receive a postcard with your next transmission date.  Any Other Special Instructions Will Be Listed Below (If Applicable).  If you need a refill on your cardiac medications before your next appointment, please call your pharmacy.      Signed, Lanier Prude, MD  03/24/2020 4:07 PM    [facility]

## 2020-03-23 NOTE — Progress Notes (Signed)
Cardiology Office Note:    Date:  03/24/2020   ID:  Chase Hill, DOB Aug 22, 1945, MRN [id number]  PCP:  [name], MD  [facility] Cardiologist:  Peter Swaziland, MD  [facility] Electrophysiologist:  Lanier Prude, MD  Referring MD: [name], MD   No chief complaint on file. Establish care for pacemaker, atrial fibrillation post operative   History of Present Illness:    Chase Hill is a 74 y.o. male with a hx of hypertrophic cardiomyopathy treated with myectomy at [facility].  His recent surgery at [facility] was complicated by atrial fibrillation, pulmonary embolism, pacemaker implantation.  He was recently seen virtually by the cardiac surgery team on February 26, 2020 and presents to clinic today to establish care for his pacemaker.  For his atrial fibrillation, he is maintained on amiodarone 200 mg daily and Eliquis twice daily.  Past Medical History:  Diagnosis Date  . ALLERGIC RHINITIS 08/16/2009   Qualifier: Diagnosis of  By: Alwyn Ren MD, Chrissie Noa   Onset:as a child Character: perennial but increased seasonally Triggers: dust mites; minor as pollen Allergy testing: yes X 2; as child & in 1995, Dr Jethro Bolus. Never took shots Maintenance medications/ response:Loratidine daily Smoking history:never Family history pulmonary disease: no     . Cervical radiculopathy at C7    PMH of  . Chest pain 11/22/2012   Evaluated by Dr Marca Ancona, Cardiology 2014 ECHO, MR : ? hypertrophic cardiomyopathy Rx: Beta blocker   . Diverticulosis    colonoscopy 10-15-2006  . DIVERTICULOSIS, COLON 08/16/2009   Qualifier: Diagnosis of  By: Alwyn Ren MD, Chrissie Noa   Dr Jarold Motto, GI    . DVT (deep venous thrombosis) (HCC)   . ELECTROCARDIOGRAM, ABNORMAL 08/16/2009   Qualifier: Diagnosis of  By: Alwyn Ren MD, William   Right bundle branch block, left posterior fascicular block( bifascicular block), not present 01/17/2007; noted 08/16/2009    . GERD (gastroesophageal reflux disease)   .  Gilbert syndrome   . GILBERT'S SYNDROME 01/14/2007   Qualifier: Diagnosis of  By: Alwyn Ren MD, Chrissie Noa    . Hemorrhoids   . Hyperlipidemia   . Hypothyroidism   . Obstructive hypertrophic cardiomyopathy (HCC) 01/15/2013  . Perennial allergic rhinitis with seasonal variation   . Pulmonary embolism (HCC)   . REFLUX, ESOPHAGEAL 01/14/2007   Qualifier: Diagnosis of  By: Alwyn Ren MD, Chrissie Noa    . Thoracic aortic aneurysm without rupture (HCC) 03/20/2017  . THYROID FUNCTION TEST, ABNORMAL 09/07/2010   Qualifier: Diagnosis of  By: Alwyn Ren MD, Chrissie Noa     . Varicose veins of right lower extremity with complications 02/04/2018    Past Surgical History:  Procedure Laterality Date  . COLONOSCOPY  2010   Tics  . ENDOVENOUS ABLATION SAPHENOUS VEIN W/ LASER Right 08/21/2018   endovenous laser ablation right greater saphenous vein and stab phlebectomy > 20 incisions right leg by Fabienne Bruns MD   . RIGHT/LEFT HEART CATH AND CORONARY ANGIOGRAPHY N/A 06/26/2019   Procedure: RIGHT/LEFT HEART CATH AND CORONARY ANGIOGRAPHY;  Surgeon: Swaziland, Peter M, MD;  Location: [facility] INVASIVE CV LAB;  Service: Cardiovascular;  Laterality: N/A;  . WISDOM TOOTH EXTRACTION      Current Medications: Current Meds  Medication Sig  . amiodarone (PACERONE) 200 MG tablet Take 1 tablet by mouth daily.  . ASHWAGANDHA PO Take 1 capsule by mouth daily.  Marland Kitchen docusate sodium (COLACE) 100 MG capsule Take 200-300 mg by mouth 2 (two) times daily.  Marland Kitchen ELIQUIS 5 MG TABS tablet Take  5 mg by mouth 2 (two) times daily.  Marland Kitchen. levothyroxine (SYNTHROID) 150 MCG tablet Take 150 mcg by mouth daily.  Marland Kitchen. loratadine (CLARITIN) 10 MG tablet Take 10 mg by mouth daily.  . Melatonin 5 MG CAPS Take 5 mg by mouth at bedtime as needed (sleep).  . Multiple Vitamin (MULTIVITAMIN) tablet Take 1 tablet by mouth daily.    . Pseudoephedrine-APAP-DM (DAYQUIL PO) Take 2 capsules by mouth 2 (two) times daily as needed (cold symptoms).  . rosuvastatin (CRESTOR) 5 MG tablet Take 5  mg by mouth 3 (three) times a week.   Current Facility-Administered Medications for the 03/24/20 encounter (Office Visit) with Lanier PrudeLambert, Eura Radabaugh T, MD  Medication  . sodium chloride flush (NS) 0.9 % injection 3 mL     Allergies:   Patient has no known allergies.   Social History   Socioeconomic History  . Marital status: Married    Spouse name: Not on file  . Number of children: 2  . Years of education: Not on file  . Highest education level: Not on file  Occupational History  . Occupation: LAWYER  Tobacco Use  . Smoking status: Never Smoker  . Smokeless tobacco: Never Used  Vaping Use  . Vaping Use: Never used  Substance and Sexual Activity  . Alcohol use: Yes    Alcohol/week: 2.0 standard drinks    Types: 2 Glasses of wine per week    Comment:  < 3 /week , only socially  . Drug use: No  . Sexual activity: Not on file  Other Topics Concern  . Not on file  Social History Narrative   Daily caffeine    Social Determinants of Health   Financial Resource Strain:   . Difficulty of Paying Living Expenses:   Food Insecurity:   . Worried About Programme researcher, broadcasting/film/videounning Out of Food in the Last Year:   . Baristaan Out of Food in the Last Year:   Transportation Needs:   . Freight forwarderLack of Transportation (Medical):   Marland Kitchen. Lack of Transportation (Non-Medical):   Physical Activity:   . Days of Exercise per Week:   . Minutes of Exercise per Session:   Stress:   . Feeling of Stress :   Social Connections:   . Frequency of Communication with Friends and Family:   . Frequency of Social Gatherings with Friends and Family:   . Attends Religious Services:   . Active Member of Clubs or Organizations:   . Attends BankerClub or Organization Meetings:   Marland Kitchen. Marital Status:      Family History: The patient's family history includes Breast cancer in his mother; Congestive Heart Failure in his father; Coronary artery disease in his father and paternal aunt; Stroke (age of onset: 7189) in his maternal grandmother. There is no  history of Diabetes, Hypertension, or Colon cancer.  ROS:   Please see the history of present illness.     All other systems reviewed and are negative.  EKGs/Labs/Other Studies Reviewed:    The following studies were reviewed today: Device interrogation, EKG.  EKG:  EKG is ordered today.  The ekg ordered today demonstrates atrial fibrillation and RV pacing.   Recent Labs: 06/23/2019: ALT 19; BUN 18; Creatinine, Ser 1.24; Platelets 208 06/26/2019: Hemoglobin 14.3; Potassium 4.2; Sodium 140  Recent Lipid Panel    Component Value Date/Time   CHOL 182 06/23/2019 0919   CHOL 202 (H) 04/29/2015 0734   TRIG 180 (H) 06/23/2019 0919   TRIG 165 (H) 04/29/2015 0734   HDL  51 06/23/2019 0919   HDL 47 04/29/2015 0734   CHOLHDL 3.6 06/23/2019 0919   CHOLHDL 3 08/24/2016 0727   VLDL 21.6 08/24/2016 0727   LDLCALC 100 (H) 06/23/2019 0919   LDLCALC 122 (H) 04/29/2015 0734   LDLDIRECT 78.7 03/28/2013 0732    Physical Exam:    VS:  BP 112/84   Pulse 83   Ht 5\' 11"  (1.803 m)   Wt 206 lb (93.4 kg)   SpO2 98%   BMI 28.73 kg/m     Wt Readings from Last 3 Encounters:  03/24/20 206 lb (93.4 kg)  03/11/20 (!) 204 lb (92.5 kg)  10/14/19 215 lb (97.5 kg)     GEN:  Well nourished, well developed in no acute distress HEENT: Normal NECK: No JVD; No carotid bruits LYMPHATICS: No lymphadenopathy CARDIAC: RRR (c/w afib with v pacing), warm well perfused. Chest wall over pacemaker well healed without tenderness over pacemaker generator. RESPIRATORY:  No IWOB. No wheezing. ABDOMEN: Soft, non-tender, non-distended MUSCULOSKELETAL:  No edema; No deformity  SKIN: Warm and dry NEUROLOGIC:  Alert and oriented x 3 PSYCHIATRIC:  Normal affect   ASSESSMENT:    1. Obstructive hypertrophic cardiomyopathy (HCC)   2. Cardiac pacemaker in situ   3. Heart block AV complete (HCC)    PLAN:    In order of problems listed above:  1. Hypertrophic obstructive cardiomyopathy status post myectomy  complicated by complete heart block requiring pacemaker implantation and paroxysmal atrial fibrillation.  Mr. Chaloux is doing well postoperatively.  His sternotomy and pacemaker incisions are healing well.  He is on a good medical regimen including apixaban and amiodarone.  2. Paroxysmal atrial fibrillation, postop.  He is now 9 weeks post op but has been in afib 100% of the time since discharge. Will increase amiodarone to 400mg  daily for the next 7 days before decreasing again to 200mg  daily. Will plan to see him back in 2 months to reassess rhythm. If he is in atrial fibrillation, will consider trial of cardioversion to restore normal rhythm. 3. DVT. He is on Eliquis. This most recent episode post op from myectomy was his second episode of DVT. The first episode was after a lower extremity procedure. Both episodes resulted in symptomatic PE.    Medication Adjustments/Labs and Tests Ordered: Current medicines are reviewed at length with the patient today.  Concerns regarding medicines are outlined above.  Orders Placed This Encounter  Procedures  . EKG 12-Lead   No orders of the defined types were placed in this encounter.   Patient Instructions  Medication Instructions:  Your physician has recommended you make the following change in your medication:  1.  Increase your amiodarone 200 mg-  Take one tablet by mouth twice a day for 7 days.  After 7 days go back to one tablet by mouth daily  Labwork: None ordered.  Testing/Procedures: None ordered.  Follow-Up: Your physician wants you to follow-up in: 2 months with Dr. Mayford Knife  May 26, 2020 at 2:50 pm at the [facility] office.     Remote monitoring is used to monitor your Pacemaker from home. This monitoring reduces the number of office visits required to check your device to one time per year. It allows [employer] to keep an eye on the functioning of your device to ensure it is working properly. You are scheduled for a device check from  home on 06/23/2020. You may send your transmission at any time that day. If you have a wireless device, the  transmission will be sent automatically. After your physician reviews your transmission, you will receive a postcard with your next transmission date.  Any Other Special Instructions Will Be Listed Below (If Applicable).  If you need a refill on your cardiac medications before your next appointment, please call your pharmacy.      Signed, Lanier Prude, MD  03/24/2020 4:07 PM    [facility]

## 2020-03-24 ENCOUNTER — Encounter: Payer: Self-pay | Admitting: Cardiology

## 2020-03-24 ENCOUNTER — Ambulatory Visit (INDEPENDENT_AMBULATORY_CARE_PROVIDER_SITE_OTHER): Payer: BC Managed Care – PPO | Admitting: Cardiology

## 2020-03-24 VITALS — BP 112/84 | HR 83 | Ht 71.0 in | Wt 206.0 lb

## 2020-03-24 DIAGNOSIS — Z950 Presence of cardiac pacemaker: Secondary | ICD-10-CM

## 2020-03-24 DIAGNOSIS — I421 Obstructive hypertrophic cardiomyopathy: Secondary | ICD-10-CM | POA: Diagnosis not present

## 2020-03-24 DIAGNOSIS — I442 Atrioventricular block, complete: Secondary | ICD-10-CM | POA: Diagnosis not present

## 2020-03-24 NOTE — Patient Instructions (Addendum)
Medication Instructions:  Your physician has recommended you make the following change in your medication:  1.  Increase your amiodarone 200 mg-  Take one tablet by mouth twice a day for 7 days.  After 7 days go back to one tablet by mouth daily  Labwork: None ordered.  Testing/Procedures: None ordered.  Follow-Up: Your physician wants you to follow-up in: 2 months with Dr. Lalla Brothers  May 26, 2020 at 2:50 pm at the [facility] office.     Remote monitoring is used to monitor your Pacemaker from home. This monitoring reduces the number of office visits required to check your device to one time per year. It allows [place] to keep an eye on the functioning of your device to ensure it is working properly. You are scheduled for a device check from home on 06/23/2020. You may send your transmission at any time that day. If you have a wireless device, the transmission will be sent automatically. After your physician reviews your transmission, you will receive a postcard with your next transmission date.  Any Other Special Instructions Will Be Listed Below (If Applicable).  If you need a refill on your cardiac medications before your next appointment, please call your pharmacy.

## 2020-03-26 DIAGNOSIS — I422 Other hypertrophic cardiomyopathy: Secondary | ICD-10-CM | POA: Diagnosis not present

## 2020-03-29 DIAGNOSIS — I422 Other hypertrophic cardiomyopathy: Secondary | ICD-10-CM | POA: Diagnosis not present

## 2020-03-31 DIAGNOSIS — I422 Other hypertrophic cardiomyopathy: Secondary | ICD-10-CM | POA: Diagnosis not present

## 2020-04-02 DIAGNOSIS — I422 Other hypertrophic cardiomyopathy: Secondary | ICD-10-CM | POA: Diagnosis not present

## 2020-04-05 DIAGNOSIS — I422 Other hypertrophic cardiomyopathy: Secondary | ICD-10-CM | POA: Diagnosis not present

## 2020-04-07 ENCOUNTER — Telehealth: Payer: Self-pay

## 2020-04-07 DIAGNOSIS — I422 Other hypertrophic cardiomyopathy: Secondary | ICD-10-CM | POA: Diagnosis not present

## 2020-04-07 NOTE — Telephone Encounter (Signed)
Follow Up:; ° ° °Returning your call. °

## 2020-04-08 NOTE — Telephone Encounter (Signed)
Patient returning call.

## 2020-04-08 NOTE — Telephone Encounter (Signed)
Call placed to Pt.  Pt is scheduled for cardioversion on 04/12/2020.  Covid test scheduled for 04/10/20.  Instructions sent via mychart per patient request.  Work up complete.

## 2020-04-09 DIAGNOSIS — I422 Other hypertrophic cardiomyopathy: Secondary | ICD-10-CM | POA: Diagnosis not present

## 2020-04-10 ENCOUNTER — Other Ambulatory Visit (HOSPITAL_COMMUNITY)
Admission: RE | Admit: 2020-04-10 | Discharge: 2020-04-10 | Disposition: A | Discharge status: Home / Self Care | Payer: BC Managed Care – PPO | Source: Ambulatory Visit | Attending: Cardiology | Admitting: Cardiology

## 2020-04-10 DIAGNOSIS — Z01812 Encounter for preprocedural laboratory examination: Secondary | ICD-10-CM | POA: Insufficient documentation

## 2020-04-10 DIAGNOSIS — Z20822 Contact with and (suspected) exposure to covid-19: Secondary | ICD-10-CM | POA: Insufficient documentation

## 2020-04-10 LAB — SARS CORONAVIRUS 2 (TAT 6-24 HRS): SARS Coronavirus 2: NEGATIVE

## 2020-04-12 ENCOUNTER — Ambulatory Visit (HOSPITAL_COMMUNITY): Payer: BC Managed Care – PPO | Admitting: Anesthesiology

## 2020-04-12 ENCOUNTER — Encounter (HOSPITAL_COMMUNITY)
Admission: RE | Disposition: A | Discharge status: Home / Self Care | Payer: Self-pay | Source: Home / Self Care | Attending: Cardiology

## 2020-04-12 ENCOUNTER — Other Ambulatory Visit: Payer: Self-pay

## 2020-04-12 ENCOUNTER — Encounter (HOSPITAL_COMMUNITY): Payer: Self-pay | Admitting: Cardiology

## 2020-04-12 ENCOUNTER — Ambulatory Visit (HOSPITAL_COMMUNITY)
Admission: RE | Admit: 2020-04-12 | Discharge: 2020-04-12 | Disposition: A | Discharge status: Home / Self Care | Payer: BC Managed Care – PPO | Attending: Cardiology | Admitting: Cardiology

## 2020-04-12 DIAGNOSIS — I4891 Unspecified atrial fibrillation: Secondary | ICD-10-CM | POA: Diagnosis not present

## 2020-04-12 DIAGNOSIS — E785 Hyperlipidemia, unspecified: Secondary | ICD-10-CM | POA: Insufficient documentation

## 2020-04-12 DIAGNOSIS — I442 Atrioventricular block, complete: Secondary | ICD-10-CM | POA: Diagnosis not present

## 2020-04-12 DIAGNOSIS — K219 Gastro-esophageal reflux disease without esophagitis: Secondary | ICD-10-CM | POA: Insufficient documentation

## 2020-04-12 DIAGNOSIS — Z7901 Long term (current) use of anticoagulants: Secondary | ICD-10-CM | POA: Diagnosis not present

## 2020-04-12 DIAGNOSIS — I480 Paroxysmal atrial fibrillation: Secondary | ICD-10-CM | POA: Insufficient documentation

## 2020-04-12 DIAGNOSIS — E039 Hypothyroidism, unspecified: Secondary | ICD-10-CM | POA: Insufficient documentation

## 2020-04-12 DIAGNOSIS — Z79899 Other long term (current) drug therapy: Secondary | ICD-10-CM | POA: Diagnosis not present

## 2020-04-12 DIAGNOSIS — I421 Obstructive hypertrophic cardiomyopathy: Secondary | ICD-10-CM | POA: Insufficient documentation

## 2020-04-12 DIAGNOSIS — I712 Thoracic aortic aneurysm, without rupture: Secondary | ICD-10-CM | POA: Diagnosis not present

## 2020-04-12 DIAGNOSIS — Z86711 Personal history of pulmonary embolism: Secondary | ICD-10-CM | POA: Diagnosis not present

## 2020-04-12 DIAGNOSIS — I714 Abdominal aortic aneurysm, without rupture: Secondary | ICD-10-CM | POA: Diagnosis not present

## 2020-04-12 DIAGNOSIS — Z950 Presence of cardiac pacemaker: Secondary | ICD-10-CM | POA: Insufficient documentation

## 2020-04-12 DIAGNOSIS — I4819 Other persistent atrial fibrillation: Secondary | ICD-10-CM | POA: Diagnosis not present

## 2020-04-12 HISTORY — PX: CARDIOVERSION: SHX1299

## 2020-04-12 LAB — POCT I-STAT, CHEM 8
BUN: 24 mg/dL — ABNORMAL HIGH (ref 8–23)
Calcium, Ion: 1.22 mmol/L (ref 1.15–1.40)
Chloride: 111 mmol/L (ref 98–111)
Creatinine, Ser: 1.2 mg/dL (ref 0.61–1.24)
Glucose, Bld: 104 mg/dL — ABNORMAL HIGH (ref 70–99)
HCT: 41 % (ref 39.0–52.0)
Hemoglobin: 13.9 g/dL (ref 13.0–17.0)
Potassium: 4.7 mmol/L (ref 3.5–5.1)
Sodium: 143 mmol/L (ref 135–145)
TCO2: 23 mmol/L (ref 22–32)

## 2020-04-12 SURGERY — CARDIOVERSION
Anesthesia: General

## 2020-04-12 MED ORDER — SODIUM CHLORIDE 0.9 % IV SOLN: INTRAVENOUS | Status: DC | PRN

## 2020-04-12 MED ORDER — LIDOCAINE 2% (20 MG/ML) 5 ML SYRINGE
INTRAMUSCULAR | Status: DC | PRN
  Administered 2020-04-12: 40 mg via INTRAVENOUS

## 2020-04-12 MED ORDER — PROPOFOL 10 MG/ML IV BOLUS
INTRAVENOUS | Status: DC | PRN
  Administered 2020-04-12: 50 mg via INTRAVENOUS

## 2020-04-12 NOTE — Discharge Instructions (Signed)
Electrical Cardioversion Electrical cardioversion is the delivery of a jolt of electricity to restore a normal rhythm to the heart. A rhythm that is too fast or is not regular keeps the heart from pumping well. In this procedure, sticky patches or metal paddles are placed on the chest to deliver electricity to the heart from a device. This procedure may be done in an emergency if:  There is low or no blood pressure as a result of the heart rhythm.  Normal rhythm must be restored as fast as possible to protect the brain and heart from further damage.  It may save a life. This may also be a scheduled procedure for irregular or fast heart rhythms that are not immediately life-threatening. Tell a health care provider about:  Any allergies you have.  All medicines you are taking, including vitamins, herbs, eye drops, creams, and over-the-counter medicines.  Any problems you or family members have had with anesthetic medicines.  Any blood disorders you have.  Any surgeries you have had.  Any medical conditions you have.  Whether you are pregnant or may be pregnant. What are the risks? Generally, this is a safe procedure. However, problems may occur, including:  Allergic reactions to medicines.  A blood clot that breaks free and travels to other parts of your body.  The possible return of an abnormal heart rhythm within hours or days after the procedure.  Your heart stopping (cardiac arrest). This is rare. What happens before the procedure? Medicines  Your health care provider may have you start taking: ? Blood-thinning medicines (anticoagulants) so your blood does not clot as easily. ? Medicines to help stabilize your heart rate and rhythm.  Ask your health care provider about: ? Changing or stopping your regular medicines. This is especially important if you are taking diabetes medicines or blood thinners. ? Taking medicines such as aspirin and ibuprofen. These medicines can  thin your blood. Do not take these medicines unless your health care provider tells you to take them. ? Taking over-the-counter medicines, vitamins, herbs, and supplements. General instructions  Follow instructions from your health care provider about eating or drinking restrictions.  Plan to have someone take you home from the hospital or clinic.  If you will be going home right after the procedure, plan to have someone with you for 24 hours.  Ask your health care provider what steps will be taken to help prevent infection. These may include washing your skin with a germ-killing soap. What happens during the procedure?   An IV will be inserted into one of your veins.  Sticky patches (electrodes) or metal paddles may be placed on your chest.  You will be given a medicine to help you relax (sedative).  An electrical shock will be delivered. The procedure may vary among health care providers and hospitals. What can I expect after the procedure?  Your blood pressure, heart rate, breathing rate, and blood oxygen level will be monitored until you leave the hospital or clinic.  Your heart rhythm will be watched to make sure it does not change.  You may have some redness on the skin where the shocks were given. Follow these instructions at home:  Do not drive for 24 hours if you were given a sedative during your procedure.  Take over-the-counter and prescription medicines only as told by your health care provider.  Ask your health care provider how to check your pulse. Check it often.  Rest for 48 hours after the procedure or   as told by your health care provider.  Avoid or limit your caffeine use as told by your health care provider.  Keep all follow-up visits as told by your health care provider. This is important. Contact a health care provider if:  You feel like your heart is beating too quickly or your pulse is not regular.  You have a serious muscle cramp that does not go  away. Get help right away if:  You have discomfort in your chest.  You are dizzy or you feel faint.  You have trouble breathing or you are short of breath.  Your speech is slurred.  You have trouble moving an arm or leg on one side of your body.  Your fingers or toes turn cold or blue. Summary  Electrical cardioversion is the delivery of a jolt of electricity to restore a normal rhythm to the heart.  This procedure may be done right away in an emergency or may be a scheduled procedure if the condition is not an emergency.  Generally, this is a safe procedure.  After the procedure, check your pulse often as told by your health care provider. This information is not intended to replace advice given to you by your health care provider. Make sure you discuss any questions you have with your health care provider. Document Revised: 03/03/2019 Document Reviewed: 03/03/2019 Elsevier Patient Education  2020 [employer]  

## 2020-04-12 NOTE — Anesthesia Procedure Notes (Signed)
Procedure Name: General with mask airway Date/Time: 04/12/2020 9:02 AM Performed by: Lovie Chol, CRNA Pre-anesthesia Checklist: Patient identified, Emergency Drugs available, Suction available and Patient being monitored Patient Re-evaluated:Patient Re-evaluated prior to induction Oxygen Delivery Method: Ambu bag Preoxygenation: Pre-oxygenation with 100% oxygen Induction Type: IV induction

## 2020-04-12 NOTE — Anesthesia Preprocedure Evaluation (Addendum)
Anesthesia Evaluation  Patient identified by MRN, date of birth, ID band Patient awake    Reviewed: Allergy & Precautions, NPO status , Patient's Chart, lab work & pertinent test results  Airway Mallampati: III  TM Distance: >3 FB Neck ROM: Full    Dental no notable dental hx. (+) Teeth Intact, Dental Advisory Given   Pulmonary PE   Pulmonary exam normal breath sounds clear to auscultation       Cardiovascular + Peripheral Vascular Disease and + DVT  Normal cardiovascular exam+ dysrhythmias Atrial Fibrillation  Rhythm:Irregular Rate:Normal  HLD  TTE 2020 1. The left ventricle has hyperdynamic systolic function, with an ejection fraction of >65%. The cavity size was normal. There is severely increased left ventricular wall thickness. Left ventricular diastolic Doppler parameters are consistent with impaired relaxation.  2. LVOT is narrowed The LV is severely hypertrophied. There is significant outflow obstruction at mid cavity and through LVOT with peak gradient at least 100 mm Hg. All consistent with HOCM.  3. The right ventricle has normal systolic function. The cavity was normal. There is no increase in right ventricular wall thickness.  4. Left atrial size was mildly dilated.  5. There is systolic anterior motion of mitral leaflets.  6. The aortic valve is tricuspid. Mild thickening of the aortic valve. Aortic valve regurgitation is mild by color flow Doppler.  7. The aorta is abnormal unless otherwise noted.  8. The ascending aorta is normal in size and structure.  9. There is mild dilatation of the ascending aorta measuring 43 mm. 1. The left ventricle has hyperdynamic systolic function, with an  ejection fraction of >65%. The cavity size was normal. There is severely increased left ventricular wall thickness. Left ventricular diastolic Doppler parameters are consistent with  ve regurgitation is mild by color flow  Doppler.  7. The aorta is abnormal unless otherwise noted.  8. The ascending aorta is normal in size and structure.  9. There is mild dilatation of the ascending aorta measuring 43 mm.   LHC/RHC 2020 1. Normal coronary anatomy 2. Normal LV function' 3. 20 mm Hg LVOT gradient at rest. Typical Brockenbrough sign post PVC. 4. Normal/low LV filling pressures 5. Normal right  Heart pressures 6. Normal cardiac output 7. Moderate MR but post PVC    Neuro/Psych negative neurological ROS  negative psych ROS   GI/Hepatic Neg liver ROS, GERD  Controlled,  Endo/Other  Hypothyroidism   Renal/GU negative Renal ROS  negative genitourinary   Musculoskeletal negative musculoskeletal ROS (+)   Abdominal   Peds  Hematology  (+) Blood dyscrasia (on eliquis), ,   Anesthesia Other Findings   Reproductive/Obstetrics                           Anesthesia Physical Anesthesia Plan  ASA: III  Anesthesia Plan: General   Post-op Pain Management:    Induction: Intravenous  PONV Risk Score and Plan: 2 and Propofol infusion and Treatment may vary due to age or medical condition  Airway Management Planned: Natural Airway  Additional Equipment:   Intra-op Plan:   Post-operative Plan:   Informed Consent: I have reviewed the patients History and Physical, chart, labs and discussed the procedure including the risks, benefits and alternatives for the proposed anesthesia with the patient or authorized representative who has indicated his/her understanding and acceptance.     Dental advisory given  Plan Discussed with: CRNA  Anesthesia Plan Comments:  Anesthesia Quick Evaluation  

## 2020-04-12 NOTE — Interval H&P Note (Signed)
History and Physical Interval Note:  04/12/2020 8:54 AM  Chase Hill  has presented today for surgery, with the diagnosis of atrial fibrillation.  The various methods of treatment have been discussed with the patient and family. After consideration of risks, benefits and other options for treatment, the patient has consented to  Procedure(s): CARDIOVERSION (N/A) as a surgical intervention.  The patient's history has been reviewed, patient examined, no change in status, stable for surgery.  I have reviewed the patient's chart and labs.  Questions were answered to the patient's satisfaction.     Ileanna Gemmill Cristal Deer

## 2020-04-12 NOTE — Transfer of Care (Signed)
Immediate Anesthesia Transfer of Care Note  Patient: Chase Hill  Procedure(s) Performed: CARDIOVERSION (N/A )  Patient Location: Endoscopy Unit  Anesthesia Type:General  Level of Consciousness: awake, oriented and patient cooperative  Airway & Oxygen Therapy: Patient Spontanous Breathing and Patient connected to nasal cannula oxygen  Post-op Assessment: Report given to RN and Post -op Vital signs reviewed and stable  Post vital signs: Reviewed  Last Vitals:  Vitals Value Taken Time  BP    Temp    Pulse    Resp    SpO2      Last Pain:  Vitals:   04/12/20 0811  TempSrc: Oral  PainSc: 0-No pain         Complications: No complications documented.

## 2020-04-12 NOTE — CV Procedure (Signed)
Procedure:   DCCV  Indication:  Symptomatic atrial fibrilation  Procedure Note:  The patient signed informed consent.  They have had had therapeutic anticoagulation with apixaban greater than 3 weeks.  Anesthesia was administered by Dr. Armond Hang.  Patient received 50 mg IV propofol.Adequate airway was maintained throughout and vital followed per protocol.  They were cardioverted x 1 with 150J of biphasic synchronized energy.  They converted to atrial paced, ventricular paced rhythm, confirmed by interrogation of his [employer]tr[employer] device.  There were no apparent complications.  The patient had normal neuro status and respiratory status post procedure with vitals stable as recorded elsewhere.    Follow up:  They will continue on current medical therapy and follow up with cardiology as scheduled.  Jodelle Red, MD PhD 04/12/2020 9:10 AM

## 2020-04-13 ENCOUNTER — Encounter (HOSPITAL_COMMUNITY): Payer: Self-pay | Admitting: Cardiology

## 2020-04-13 NOTE — Anesthesia Postprocedure Evaluation (Signed)
Anesthesia Post Note  Patient: EVAAN TIDWELL  Procedure(s) Performed: CARDIOVERSION (N/A )     Patient location during evaluation: Endoscopy Anesthesia Type: General Level of consciousness: awake and alert Pain management: pain level controlled Vital Signs Assessment: post-procedure vital signs reviewed and stable Respiratory status: spontaneous breathing, nonlabored ventilation, respiratory function stable and patient connected to nasal cannula oxygen Cardiovascular status: blood pressure returned to baseline and stable Postop Assessment: no apparent nausea or vomiting Anesthetic complications: no   No complications documented.  Last Vitals:  Vitals:   04/12/20 0914 04/12/20 0924  BP: 134/80 (!) 145/89  Pulse: 62 61  Resp: 16 17  Temp:    SpO2: (!) 18% 100%    Last Pain:  Vitals:   04/12/20 0924  TempSrc:   PainSc: 0-No pain                 Zehra Rucci L Janayah Zavada

## 2020-04-14 DIAGNOSIS — I422 Other hypertrophic cardiomyopathy: Secondary | ICD-10-CM | POA: Diagnosis not present

## 2020-04-15 ENCOUNTER — Telehealth (HOSPITAL_COMMUNITY): Payer: Self-pay | Admitting: Internal Medicine

## 2020-04-16 ENCOUNTER — Telehealth (HOSPITAL_COMMUNITY): Payer: Self-pay

## 2020-04-16 DIAGNOSIS — I422 Other hypertrophic cardiomyopathy: Secondary | ICD-10-CM | POA: Diagnosis not present

## 2020-04-16 NOTE — Telephone Encounter (Signed)
Called and spoke with pt in regards to picking up CR at [facility]. Adv pt he would have to come in as a new pt, he would not be able to just pick up from where he is at with [facility].

## 2020-04-21 DIAGNOSIS — I422 Other hypertrophic cardiomyopathy: Secondary | ICD-10-CM | POA: Diagnosis not present

## 2020-04-23 DIAGNOSIS — I422 Other hypertrophic cardiomyopathy: Secondary | ICD-10-CM | POA: Diagnosis not present

## 2020-04-26 DIAGNOSIS — I422 Other hypertrophic cardiomyopathy: Secondary | ICD-10-CM | POA: Diagnosis not present

## 2020-05-06 NOTE — Progress Notes (Signed)
Date:  05/12/2020   ID:  Chase Hill, DOB May 09, 1946, MRN [id number]  PCP:  [name], MD  Cardiologist:  Syrianna Schillaci [place], MD  Electrophysiologist:  Lanier Prude, MD   Evaluation Performed:  Follow-Up Visit   Chief Complaint:  HOCM  History of Present Illness:    Chase Hill is a 74 y.o. male with PMH of hypertrophic cardiomyopathy, history of DVT/PE, TAA, hyperlipidemia, RBBB, Gilbert's syndrome, hypothyroidism.  He was previously followed by Dr. Shirlee Latch.  Stress Myoview in 2011 showed inferior and apical ischemia.  Subsequent cardiac cath showed no obstructive disease.  In 2014, echocardiogram demonstrated basal septal hypertrophy with mild LV outflow tract gradient and chordal versus valvular SAM, moderate diastolic dysfunction.  Cardiac MRI obtained in June 2014 showed mild focal basal septal hypertrophy with systolic anterior motion of the mitral valve and mild MR.  There was no significant delayed enhancement.  He has been intolerant of pravastatin due to myalgia.  He also developed  fatigue and orthostasis on beta-blocker.  Due to increased septal hypertrophy and outflow gradient on previous echo in July 2018, he was started on Cardizem.  This was later discontinued due to concern of conduction disease with first-degree AV block, left anterior fascicular block and a right bundle branch block. He later underwent laser ablation of varicose veins by Dr. Darrick Penna. He was diagnosed with PE on CT angiogram of the chest in January 2029 and was placed on Xarelto.  PE was felt to be provoked by laser vein treatment.  He completed 6 months of therapy with Xarelto and switch to aspirin.  CT angiogram of the chest recently showed 4.6 cm thoracic aortic aneurysm.  Recent lower extremity venous Doppler obtained on 04/10/2019 was negative for DVT.  Repeat echocardiogram obtained on 04/24/2019, EF was greater than 65%, severe LVH, significant outflow obstruction at the mid cavity and  LVOT  with peak gradient of at least 100 mmHg, systolic anterior motion of mitral leaflet, mild dilatation of the ascending aorta measuring 43 mm.   We did proceed with cardiac cath on 06/26/19. This showed normal coronary anatomy. Normal LV and right heart pressures. LVOT gradient 20 mm Hg by cath. MRI was also done showing typical features of HOCM with predominant sigmoid septum and SAM. There was only mild late gadolinium enhancement. An Event monitor was placed with results noted below.  He was subsequently referred to [facility] and underwent a Septal myectomy on 01/14/20 by Dr Maryanna Shape. On 01/16/20 he had a left sided DDD pacemaker (Medtronic XT AZT MRI) placed for development of complete heart block. He did well and was DC on 01/20/20. He was readmitted 6/24-6/29/21 with acute pulmonary embolus. He had a 10 day history of increased SOB. Was noted on pacemaker check to have paroxysmal AFib. Was started on Eliquis but symptoms progressed and he presented to the ED. In the emergency room he remained hemodynamically stable. Labs revealed a hemoglobin 12.4, WBC 10.9, sodium 134, ALT 161, AST 130. Troponin returned at 36 with a negative delta of 3. NT proBNP returned at 3471. EKG showed atrial flutter. Chest x-ray showed minimal fibrosis/atelectasis in left lung base, otherwise clear. Chest CT showed acute multilobe for segmental and subsegmental thrombo emboli affecting all lobes. Chest CT and bedside echocardiogram did not reveal evidence of right heart strain. The patient received IV fluids, loading dose of heparin and was initiated on high-intensity heparin GTT. 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 The patient was transitioned to bivalirudin. Lupus anticoagulant was negative. Upon discharge the patient transition back to apixaban 10 mg b.i.d. x7 days and then 5 mg b.i.d. x3 months or indefinitely for atrial fibrillation.  LE doppler showed extensive DVT in left leg from the common femoral vein down.The patient's outpatient cardiologist, Dr. Gwendalyn Ege, was contacted regarding starting amiodarone while the patient was hospitalized. He recommended initiating amiodarone 200 mg b.i.d. x7 days followed by 200 mg daily with Cardiology follow up.  Since then he has had persistent Afib. Was seen by Dr Lalla Brothers who initially increased his amiodarone. He then developed side effects on the amiodarone with tremors, difficulty writing and typing,  and imbalance. We proceeded with DCCV on 04/12/20 and amiodarone was discontinued.    On follow up today he states that he is doing much  Better. The symptoms he was having on amiodarone have resolved.  His breathing is much better.  His energy level is good. No pain.Pacer site is healing well. He still notes some swelling in his left leg but it doesn't bother him. Does wear compression hose.    Past Medical History:  Diagnosis Date  . ALLERGIC RHINITIS 08/16/2009   Qualifier: Diagnosis of  By: Alwyn Ren MD, Chrissie Noa   Onset:as a child Character: perennial but increased seasonally Triggers: dust mites; minor as pollen Allergy testing: yes X 2; as child & in 1995, Dr Jethro Bolus. Never took shots Maintenance medications/ response:Loratidine daily Smoking history:never Family history pulmonary disease: no     . Cervical radiculopathy at C7    PMH of  . Chest pain 11/22/2012   Evaluated by Dr Marca Ancona, Cardiology 2014 ECHO, MR : ? hypertrophic cardiomyopathy Rx: Beta blocker   . Diverticulosis    colonoscopy 10-15-2006  . DIVERTICULOSIS, COLON 08/16/2009   Qualifier: Diagnosis of  By: Alwyn Ren MD, Chrissie Noa   Dr Jarold Motto, GI    . DVT (deep venous thrombosis) (HCC)   . ELECTROCARDIOGRAM, ABNORMAL 08/16/2009   Qualifier: Diagnosis of  By: Alwyn Ren MD, William   Right bundle branch block, left posterior fascicular block( bifascicular block), not present 01/17/2007; noted 08/16/2009    . GERD  (gastroesophageal reflux disease)   . Gilbert syndrome   . GILBERT'S SYNDROME 01/14/2007   Qualifier: Diagnosis of  By: Alwyn Ren MD, Chrissie Noa    . Hemorrhoids   . Hyperlipidemia   . Hypothyroidism   . Obstructive hypertrophic cardiomyopathy (HCC) 01/15/2013  . Perennial allergic rhinitis with seasonal variation   . Pulmonary embolism (HCC)   . REFLUX, ESOPHAGEAL 01/14/2007   Qualifier: Diagnosis of  By: Alwyn Ren MD, Chrissie Noa    . Thoracic aortic aneurysm without rupture (HCC) 03/20/2017  . THYROID FUNCTION TEST, ABNORMAL 09/07/2010   Qualifier: Diagnosis of  By: Alwyn Ren MD, Chrissie Noa     . Varicose veins of right lower extremity with complications 02/04/2018   Past Surgical History:  Procedure Laterality Date  . CARDIOVERSION N/A 04/12/2020   Procedure: CARDIOVERSION;  Surgeon: Jodelle Red, MD;  Location: [facility] ENDOSCOPY;  Service: Cardiovascular;  Laterality: N/A;  . COLONOSCOPY  2010   Tics  . ENDOVENOUS ABLATION SAPHENOUS VEIN W/ LASER Right 08/21/2018   endovenous laser ablation right greater saphenous vein and stab phlebectomy > 20 incisions right leg by Fabienne Bruns MD   . RIGHT/LEFT HEART CATH AND CORONARY ANGIOGRAPHY N/A 06/26/2019   Procedure: RIGHT/LEFT HEART CATH AND CORONARY ANGIOGRAPHY;  Surgeon: [place], Jennipher Weatherholtz M, MD;  Location: [facility] INVASIVE CV LAB;  Service: Cardiovascular;  Laterality: N/A;  . WISDOM TOOTH EXTRACTION       Current Meds  Medication Sig  . docusate sodium (COLACE) 100 MG capsule Take 200 mg by mouth 2 (two) times daily as needed (constipation).   . ELIQUIS 5 MG TABS tablet Take Marland Kitchen5 mg by mouth 2 (two) times daily.  Marland Kitchen. levothyroxine (SYNTHROID) 150 MCG tablet Take 150 mcg by mouth daily.  Marland Kitchen. loratadine (CLARITIN) 10 MG tablet Take 10 mg by mouth daily after breakfast.   . Melatonin 5 MG CAPS Take 5 mg by mouth at bedtime as needed (sleep).  . Multiple Vitamin (MULTIVITAMIN WITH MINERALS) TABS tablet Take 1 tablet by mouth every evening.  . rosuvastatin (CRESTOR) 5  MG tablet Take 5 mg by mouth every Monday, Wednesday, and Friday at 8 PM. BEDTIME.     Allergies:   Patient has no known allergies.   Social History   Tobacco Use  . Smoking status: Never Smoker  . Smokeless tobacco: Never Used  Vaping Use  . Vaping Use: Never used  Substance Use Topics  . Alcohol use: Yes    Alcohol/week: 2.0 standard drinks    Types: 2 Glasses of wine per week    Comment:  < 3 /week , only socially  . Drug use: No     Family Hx: The patient's family history includes Breast cancer in his mother; Congestive Heart Failure in his father; Coronary artery disease in his father and paternal aunt; Stroke (age of onset: 4489) in his maternal grandmother. There is no history of Diabetes, Hypertension, or Colon cancer.  ROS:   Please see the history of present illness.    All other systems reviewed and are negative.   Prior CV studies:   The following studies were reviewed today:  CTA of chest 02/19/2019 IMPRESSION: 1. No change in caliber of the tubular ascending thoracic aorta, measuring 4.6 x 4.3 cm, not significantly changed in caliber, measuring 4.5 x 4.2 cm on examinations dating back to 03/23/2017. The sinuses of Valsalva measure up to 4.4 cm in caliber. Normal caliber of the aortic valve. The descending thoracic aorta is normal in caliber. No significant atherosclerosis.  2. Examination is not tailored for the evaluation of pulmonary embolism, however there is no obvious residual pulmonary embolism in the right lower lobe at the site of embolism seen on prior examination dated 09/11/2018.  3. Stable, benign small pulmonary nodules.   Venous doppler 04/10/2019 Summary: Right: Successful vein closure. There is no evidence of deep vein thrombosis in the lower extremity. No cystic structure found in the popliteal fossa. No significant change compared to previous study. Left: No evidence of common femoral vein obstruction.  *See table(s) above for  measurements and observations.   Echo 04/24/2019 IMPRESSIONS 1. The left ventricle has hyperdynamic systolic function, with an ejection fraction of >65%. The cavity size was normal. There is severely increased left ventricular wall thickness. Left ventricular diastolic Doppler parameters are consistent with  impaired relaxation. 2. LVOT is narrowed The LV is severely hypertrophied. There is significant outflow obstruction at mid cavity and through LVOT with peak gradient at least 100 mm Hg. All consistent with HOCM. 3. The right ventricle has normal systolic function. The cavity was normal. There is no increase in right ventricular wall thickness. 4. Left atrial size was mildly dilated. 5. There is systolic anterior motion of mitral leaflets. 6. The aortic valve is tricuspid. Mild thickening of the aortic valve. Aortic valve regurgitation is mild  by color flow Doppler. 7. The aorta is abnormal unless otherwise noted. 8. The ascending aorta is normal in size and structure. 9. There is mild dilatation of the ascending aorta measuring 43 mm.   Cardiac cath 06/26/19:  RIGHT/LEFT HEART CATH AND CORONARY ANGIOGRAPHY  Conclusion    The left ventricular systolic function is normal.  LV end diastolic pressure is normal.  The left ventricular ejection fraction is 55-65% by visual estimate.   1. Normal coronary anatomy 2. Normal LV function' 3. 20 mm Hg LVOT gradient at rest. Typical Brockenbrough sign post PVC. 4. Normal/low LV filling pressures 5. Normal right  Heart pressures 6. Normal cardiac output 7. Moderate MR but post PVC  Plan: recommend further evaluation with cardiac MRI and event monitor.     CLINICAL DATA:  Hypertrophic cardiomyopathy suspected, further testing  COMPARISON: No images available from prior cardiac MRI for comparison  EXAM: CARDIAC MRI  TECHNIQUE: The patient was scanned on a 1.5 Tesla GE magnet. A dedicated cardiac coil was used.  Functional imaging was done using Fiesta sequences. 2,3, and 4 chamber views were done to assess for RWMA's. Modified Simpson's rule using a short axis stack was used to calculate an ejection fraction on a dedicated work [employer]. The patient received 11mL GADAVIST GADOBUTROL 1 MMOL/ML IV SOLN. After 10 minutes inversion recovery sequences were used to assess for infiltration and scar tissue.  CONTRAST:  5mL GADAVIST GADOBUTROL 1 MMOL/ML IV SOLN  FINDINGS: LEFT VENTRICLE:  Normal left ventricular size. Hyperdynamic systolic function (LVEF = 69%).  There are no regional wall motion abnormalities.  Late gadolinium enhancement in the left ventricular myocardium is seen in the inferior RV insertion point at the mid ventricle and apex. This finding is seen in an area of increased myocardial wall thickness. Differential diagnosis includes fibrosis secondary to hypertrophic cardiomyopathy vs increased pulmonary pressures.  Normal T1 myocardial nulling kinetics suggest against a diagnosis of cardiac amyloidosis.  Maximal wall thickness: 18 mm  Location: Basal septum.  There is systolic anterior motion of the mitral valve. Flow dephasing suggests left ventricular outflow tract obstruction.  Mitral regurgitation is present and posteriorly directed.  No evidence of left ventricular apical aneurysm.  Findings are consistent with hypertrophic cardiomyopathy with obstruction. Morphologic subtype: Sigmoid subtype.  RIGHT VENTRICLE:  Normal right ventricular size, thickness and systolic function (RVEF = 63%).  There are no regional wall motion abnormalities.  No delayed myocardial enhancement in the right ventricle.  ATRIA:  Mildly dilated left atrial chamber size.  normal right atrial size.  VALVES:  Systolic anterior motion of the mitral valve with posteriorly directed mitral regurgitation, qualitatively mild.  Qualitatively  trivial aortic valve regurgitation by flow dephasing.  PERICARDIUM:  Normal pericardium.  No pericardial effusion.  AORTA:  Mid ascending aorta measures 43 mm in a sagittal transverse plane. Orthogonal measurements unable to be performed for true axial measurement.  OTHER:  MEASUREMENTS:  LVEDV: 145 ml  LVESV: 45 ml  SV: 100 ml  CO: 6 L/min  Myocardial mass: 184 g  RVEDV: 100 ml  RVEDS: 38 ml  RVSV: 63 mL  IMPRESSION: 1.  Hyperdynamic left ventricular systolic function.  LVEF 69%.  2.  Normal right ventricular chamber size and function.  RVEF 63%.  3. Findings consistent with hypertrophic cardiomyopathy, sigmoid subtype, maximal wall thickness 18 mm at the basal left ventricular septum.  4. Left ventricular outflow tract obstruction. Flow dephasing seen in the LVOT with systolic anterior motion of the  mitral valve, and posteriorly directed mitral valve regurgitation.  5. Delayed myocardial enhancement is seen in an area of increased left ventricular wall thickness at the mid ventricle, at the inferior RV insertion point. This finding could represent fibrosis in the setting hypertrophic cardiomyopathy vs. increased pulmonary pressures.  6.  Mid ascending aorta measures 43 mm in a transverse plane.   Electronically Signed   By: Weston Brass  Event monitor 08/19/19.Study Highlights   Normal sinus rhythm with first degree AV block  Episodes of second degree AV block Mobitz type 1  PVCs. One run of AIVR  PACs with short bursts of PACs- longest 6 beats.  No Afib noted- episodes noted on monitor appear to be artifact.   Echo 01/19/20:Echocardiogram 01/19/2020 Final Impressions 1. Hypertrophic cardiomyopathy status post left ventricular septal myectomy (14-Jan-2020). 2. Systolic anterior motion of mitral apparatus with trivial mitral valve regurgitation. No dynamic left ventricular outflow tract obstruction at rest. 3. Mild aortic  valve regurgitation (central jet). ERO 0.07 cm2, regurgitant volume 19 mL. 4. Normal left ventricular chamber size, calculated 2-D linear ejection fraction 70 %. 5. Normal right ventricular chamber size, mildly reduced systolic function, estimated right ventricular systolic pressure 22 mmHg (systolic blood pressure 129 mmHg). 6. Mildly enlarged mid ascending aorta diameter (diameter 45 mm at mid level). Upper limits of normal for age, sex, and BSA = 43 mm. 7. No pericardial effusion.    Echo 01/19/20: Final Impressions  1. Hypertrophic cardiomyopathy status post left ventricular septal myectomy (14-Jan-2020).  2. Systolic anterior motion of mitral apparatus with trivial mitral valve regurgitation. No  dynamic left ventricular outflow tract obstruction at rest.  3. Mild aortic valve regurgitation (central jet). ERO 0.07 cm^2, regurgitant volume 19 mL.  4. Normal left ventricular chamber size, calculated 2-D linear ejection fraction 70 %.  5. Normal right ventricular chamber size, mildly reduced systolic function, estimated right  ventricular systolic pressure 22 mmHg (systolic blood pressure 129 mmHg).  6. Mildly enlarged mid ascending aorta diameter (diameter 45 mm at mid level). Upper limits of  normal for age, sex, and BSA = 43 mm.  7. No pericardial effusion.   Labs/Other Tests and Data Reviewed:    EKG:  No ECG reviewed.  Recent Labs: 06/23/2019: ALT 19; Platelets 208 04/12/2020: BUN 24; Creatinine, Ser 1.20; Hemoglobin 13.9; Potassium 4.7; Sodium 143   Recent Lipid Panel Lab Results  Component Value Date/Time   CHOL 182 06/23/2019 09:19 AM   CHOL 202 (H) 04/29/2015 07:34 AM   TRIG 180 (H) 06/23/2019 09:19 AM   TRIG 165 (H) 04/29/2015 07:34 AM   HDL 51 06/23/2019 09:19 AM   HDL 47 04/29/2015 07:34 AM   CHOLHDL 3.6 06/23/2019 09:19 AM   CHOLHDL 3 08/24/2016 07:27 AM   LDLCALC 100 (H) 06/23/2019 09:19 AM   LDLCALC 122 (H) 04/29/2015 07:34 AM   LDLDIRECT 78.7 03/28/2013 07:32 AM    Dated 08/12/19: creatinine 1.2. otherwise CMET normal. CBC and TSH normal. Cholesterol 177, triglycerides 209, HDL 43, LDL 92.  Dated 02/19/20: Hgb 12.9, creatinine 1.3. TSH 5.22. ALT 25.  Ecg today shows AV pacing rate 65. I have personally reviewed and interpreted this study.   Wt Readings from Last 3 Encounters:  05/12/20 208 lb 9.6 oz (94.6 kg)  04/12/20 200 lb (90.7 kg)  03/24/20 206 lb (93.4 kg)     Objective:    Vital Signs:  BP 136/88   Pulse 64   Ht 5\' 11"  (1.803 m)   Wt 208  lb 9.6 oz (94.6 kg)   SpO2 95%   BMI 29.09 kg/m    GENERAL:  Well appearing WM in NAD HEENT:  PERRL, EOMI, sclera are clear. Oropharynx is clear. NECK:  No jugular venous distention, carotid upstroke brisk and symmetric, no bruits, no thyromegaly or adenopathy LUNGS:  Clear to auscultation bilaterally CHEST:  Unremarkable HEART:  RRR,  PMI not displaced or sustained,S1 and S2 within normal limits, no S3, no S4: no clicks, no rubs, no murmurs. Sternal incision is healing well.  ABD:  Soft, nontender. BS +, no masses or bruits. No hepatomegaly, no splenomegaly EXT:  2 + pulses throughout, no edema, no cyanosis no clubbing SKIN:  Warm and dry.  No rashes NEURO:  Alert and oriented x 3. Cranial nerves II through XII intact. PSYCH:  Cognitively intact   ASSESSMENT & PLAN:    1. Hypertrophic cardiomyopathy:  with marked increase in LVOT gradient 100 mm Hg this year. Symptom of dyspnea on exertion and dizziness with exertion.  Intolerant of beta blocker in the past due to dizziness. Now s/p septal Myectomy at [facility]. S/p DDDR pacemaker for CHB which was anticipated. Course complicated by development of large Left leg DVT and pulmonary embolus. This was associated with  AFib/flutter.On Eliquis.  Echo post op at [facility] on 01/19/20 showed no outflow gradient and trivial MR. Normal EF. Will refer to cardiac Rehab.    2. Thoracic aortic aneurysm: Mildly dilated aorta on recent echocardiogram measuring up  to 43 mm. Follow up yearly.   3. Hyperlipidemia: Continue Crestor.  4. Hypothyroidism: On Synthroid. TSH OK   5. History of PE past- more recent DVT/ PE post op heart surgery. Repeat venous doppler showed persistent common femoral and popliteal DVT but improved signal distally. We had discussed early on possible thrombectomy but he deferred. Will continue Eliquis and support hose.   6. Second degree AV block Mobitz type 1 progressing to CHB post septal myectomy. S/p DDDR pacemaker with Medtronic generator. Pacer check on 6/29 was OK. Pacer site is healing well. Follow up with Dr Lalla Brothers on 10/13.   7. Paroxysmal Afib/flutter. Post op heart surgery and in setting of acute PE. Was in persistent Afib/flutter in follow up. S/p successful DCCV on 04/12/20. Maintaining NSR so far. Amiodarone discontinued due to side effects. If Afib recurs will need to consider alternative therapies.   Medication Adjustments/Labs and Tests Ordered: Current medicines are reviewed at length with the patient today.  Concerns regarding medicines are outlined above.   Tests Ordered: No orders of the defined types were placed in this encounter.   Medication Changes: No orders of the defined types were placed in this encounter.   Follow Up: 3-4 months  Signed, Avah Bashor [place], MD  05/12/2020 12:14 PM    [facility]

## 2020-05-12 ENCOUNTER — Other Ambulatory Visit: Payer: Self-pay

## 2020-05-12 ENCOUNTER — Encounter: Payer: Self-pay | Admitting: Cardiology

## 2020-05-12 ENCOUNTER — Ambulatory Visit (INDEPENDENT_AMBULATORY_CARE_PROVIDER_SITE_OTHER): Payer: BC Managed Care – PPO | Admitting: Cardiology

## 2020-05-12 VITALS — BP 136/88 | HR 64 | Ht 71.0 in | Wt 208.6 lb

## 2020-05-12 DIAGNOSIS — I421 Obstructive hypertrophic cardiomyopathy: Secondary | ICD-10-CM

## 2020-05-12 DIAGNOSIS — I480 Paroxysmal atrial fibrillation: Secondary | ICD-10-CM

## 2020-05-12 DIAGNOSIS — Z950 Presence of cardiac pacemaker: Secondary | ICD-10-CM | POA: Diagnosis not present

## 2020-05-12 DIAGNOSIS — Z86711 Personal history of pulmonary embolism: Secondary | ICD-10-CM

## 2020-05-12 DIAGNOSIS — I824Y9 Acute embolism and thrombosis of unspecified deep veins of unspecified proximal lower extremity: Secondary | ICD-10-CM

## 2020-05-15 DIAGNOSIS — Z23 Encounter for immunization: Secondary | ICD-10-CM | POA: Diagnosis not present

## 2020-05-26 ENCOUNTER — Other Ambulatory Visit: Payer: Self-pay

## 2020-05-26 ENCOUNTER — Ambulatory Visit (INDEPENDENT_AMBULATORY_CARE_PROVIDER_SITE_OTHER): Payer: BC Managed Care – PPO | Admitting: Cardiology

## 2020-05-26 ENCOUNTER — Encounter: Payer: Self-pay | Admitting: Cardiology

## 2020-05-26 VITALS — BP 110/60 | HR 68 | Ht 71.0 in | Wt 208.0 lb

## 2020-05-26 DIAGNOSIS — I421 Obstructive hypertrophic cardiomyopathy: Secondary | ICD-10-CM | POA: Diagnosis not present

## 2020-05-26 DIAGNOSIS — Z950 Presence of cardiac pacemaker: Secondary | ICD-10-CM | POA: Diagnosis not present

## 2020-05-26 DIAGNOSIS — I4819 Other persistent atrial fibrillation: Secondary | ICD-10-CM

## 2020-05-26 DIAGNOSIS — I442 Atrioventricular block, complete: Secondary | ICD-10-CM | POA: Insufficient documentation

## 2020-05-26 LAB — CUP PACEART INCLINIC DEVICE CHECK
Battery Remaining Longevity: 152 mo
Battery Voltage: 3.18 V
Brady Statistic AP VP Percent: 38.98 %
Brady Statistic AP VS Percent: 0 %
Brady Statistic AS VP Percent: 61 %
Brady Statistic AS VS Percent: 0.02 %
Brady Statistic RA Percent Paced: 38.82 %
Brady Statistic RV Percent Paced: 99.98 %
Date Time Interrogation Session: 20211013155621
Implantable Lead Implant Date: 20210604
Implantable Lead Implant Date: 20210604
Implantable Lead Location: 753859
Implantable Lead Location: 753860
Implantable Lead Model: 4076
Implantable Lead Model: 4076
Implantable Pulse Generator Implant Date: 20210604
Lead Channel Impedance Value: 304 Ohm
Lead Channel Impedance Value: 494 Ohm
Lead Channel Impedance Value: 513 Ohm
Lead Channel Impedance Value: 570 Ohm
Lead Channel Pacing Threshold Amplitude: 0.5 V
Lead Channel Pacing Threshold Amplitude: 0.75 V
Lead Channel Pacing Threshold Pulse Width: 0.4 ms
Lead Channel Pacing Threshold Pulse Width: 0.4 ms
Lead Channel Sensing Intrinsic Amplitude: 6.4 mV
Lead Channel Setting Pacing Amplitude: 1.75 V
Lead Channel Setting Pacing Amplitude: 2 V
Lead Channel Setting Pacing Pulse Width: 0.4 ms
Lead Channel Setting Sensing Sensitivity: 0.9 mV

## 2020-05-26 NOTE — Patient Instructions (Signed)
Medication Instructions:  Your physician recommends that you continue on your current medications as directed. Please refer to the Current Medication list given to you today.  Labwork: None ordered.  Testing/Procedures: None ordered.  Follow-Up: Your physician wants you to follow-up in: 6 months with Dr. Lalla Brothers.   You will receive a reminder letter in the mail two months in advance. If you don't receive a letter, please call our office to schedule the follow-up appointment.  Remote monitoring is used to monitor your Pacemaker from home. This monitoring reduces the number of office visits required to check your device to one time per year. It allows [place] to keep an eye on the functioning of your device to ensure it is working properly. You are scheduled for a device check from home on 06/23/2020. You may send your transmission at any time that day. If you have a wireless device, the transmission will be sent automatically. After your physician reviews your transmission, you will receive a postcard with your next transmission date.  Any Other Special Instructions Will Be Listed Below (If Applicable).  If you need a refill on your cardiac medications before your next appointment, please call your pharmacy.

## 2020-05-26 NOTE — Progress Notes (Signed)
Electrophysiology Office Follow up Visit Note:    Date:  05/26/2020   ID:  Chase Hill, DOB 07/14/1946, MRN [id number]  PCP:  [name], MD  [facility] Cardiologist:  Peter Swaziland, MD  [facility] Electrophysiologist:  Lanier Prude, MD    Interval History:    Chase Hill is a 74 y.o. male who presents for a follow up visit. They were last seen in clinic March 24, 2020.  At that appointment he was in atrial fibrillation and was started on amiodarone in an attempt to restore normal rhythm.  He did not tolerate amiodarone with significant neurological side effects.  This medication was ultimately stopped and he was cardioverted on April 12, 2020.  Based on his device recordings he has maintained normal rhythm.  He feels well without any residual neurologic off target effects on the amiodarone.  He is still taking his anticoagulant and tolerating without any bleeding problems.     Past Medical History:  Diagnosis Date  . ALLERGIC RHINITIS 08/16/2009   Qualifier: Diagnosis of  By: Alwyn Ren MD, Chrissie Noa   Onset:as a child Character: perennial but increased seasonally Triggers: dust mites; minor as pollen Allergy testing: yes X 2; as child & in 1995, Dr Chase Hill. Never took shots Maintenance medications/ response:Loratidine daily Smoking history:never Family history pulmonary disease: no     . Cervical radiculopathy at C7    PMH of  . Chest pain 11/22/2012   Evaluated by Dr Marca Ancona, Cardiology 2014 ECHO, MR : ? hypertrophic cardiomyopathy Rx: Beta blocker   . Diverticulosis    colonoscopy 10-15-2006  . DIVERTICULOSIS, COLON 08/16/2009   Qualifier: Diagnosis of  By: Alwyn Ren MD, Chrissie Noa   Dr Chase Hill, GI    . DVT (deep venous thrombosis) (HCC)   . ELECTROCARDIOGRAM, ABNORMAL 08/16/2009   Qualifier: Diagnosis of  By: Alwyn Ren MD, William   Right bundle branch block, left posterior fascicular block( bifascicular block), not present 01/17/2007; noted 08/16/2009    .  GERD (gastroesophageal reflux disease)   . Gilbert syndrome   . GILBERT'S SYNDROME 01/14/2007   Qualifier: Diagnosis of  By: Alwyn Ren MD, Chrissie Noa    . Hemorrhoids   . Hyperlipidemia   . Hypothyroidism   . Obstructive hypertrophic cardiomyopathy (HCC) 01/15/2013  . Perennial allergic rhinitis with seasonal variation   . Pulmonary embolism (HCC)   . REFLUX, ESOPHAGEAL 01/14/2007   Qualifier: Diagnosis of  By: Alwyn Ren MD, Chrissie Noa    . Thoracic aortic aneurysm without rupture (HCC) 03/20/2017  . THYROID FUNCTION TEST, ABNORMAL 09/07/2010   Qualifier: Diagnosis of  By: Alwyn Ren MD, Chrissie Noa     . Varicose veins of right lower extremity with complications 02/04/2018    Past Surgical History:  Procedure Laterality Date  . CARDIOVERSION N/A 04/12/2020   Procedure: CARDIOVERSION;  Surgeon: Chase Red, MD;  Location: [facility] ENDOSCOPY;  Service: Cardiovascular;  Laterality: N/A;  . COLONOSCOPY  2010   Tics  . ENDOVENOUS ABLATION SAPHENOUS VEIN W/ LASER Right 08/21/2018   endovenous laser ablation right greater saphenous vein and stab phlebectomy > 20 incisions right leg by Fabienne Bruns MD   . RIGHT/LEFT HEART CATH AND CORONARY ANGIOGRAPHY N/A 06/26/2019   Procedure: RIGHT/LEFT HEART CATH AND CORONARY ANGIOGRAPHY;  Surgeon: Swaziland, Peter M, MD;  Location: [facility] INVASIVE CV LAB;  Service: Cardiovascular;  Laterality: N/A;  . WISDOM TOOTH EXTRACTION      Current Medications: Current Meds  Medication Sig  . docusate sodium (COLACE) 100 MG capsule Take 200 mg  by mouth 2 (two) times daily as needed (constipation).   Marland Kitchen ELIQUIS 5 MG TABS tablet Take 5 mg by mouth 2 (two) times daily.  Marland Kitchen levothyroxine (SYNTHROID) 150 MCG tablet Take 150 mcg by mouth daily.  Marland Kitchen loratadine (CLARITIN) 10 MG tablet Take 10 mg by mouth daily after breakfast.   . Melatonin 5 MG CAPS Take 5 mg by mouth at bedtime as needed (sleep).  . Multiple Vitamin (MULTIVITAMIN WITH MINERALS) TABS tablet Take 1 tablet by mouth every evening.    . rosuvastatin (CRESTOR) 5 MG tablet Take 5 mg by mouth every Monday, Wednesday, and Friday at 8 PM. BEDTIME.     Allergies:   Patient has no known allergies.   Social History   Socioeconomic History  . Marital status: Married    Spouse name: Not on file  . Number of children: 2  . Years of education: Not on file  . Highest education level: Not on file  Occupational History  . Occupation: LAWYER  Tobacco Use  . Smoking status: Never Smoker  . Smokeless tobacco: Never Used  Vaping Use  . Vaping Use: Never used  Substance and Sexual Activity  . Alcohol use: Yes    Alcohol/week: 2.0 standard drinks    Types: 2 Glasses of wine per week    Comment:  < 3 /week , only socially  . Drug use: No  . Sexual activity: Not on file  Other Topics Concern  . Not on file  Social History Narrative   Daily caffeine    Social Determinants of Health   Financial Resource Strain:   . Difficulty of Paying Living Expenses: Not on file  Food Insecurity:   . Worried About Programme researcher, broadcasting/film/video in the Last Year: Not on file  . Ran Out of Food in the Last Year: Not on file  Transportation Needs:   . Lack of Transportation (Medical): Not on file  . Lack of Transportation (Non-Medical): Not on file  Physical Activity:   . Days of Exercise per Week: Not on file  . Minutes of Exercise per Session: Not on file  Stress:   . Feeling of Stress : Not on file  Social Connections:   . Frequency of Communication with Friends and Family: Not on file  . Frequency of Social Gatherings with Friends and Family: Not on file  . Attends Religious Services: Not on file  . Active Member of Clubs or Organizations: Not on file  . Attends Banker Meetings: Not on file  . Marital Status: Not on file     Family History: The patient's family history includes Breast cancer in his mother; Congestive Heart Failure in his father; Coronary artery disease in his father and paternal aunt; Stroke (age of onset:  61) in his maternal grandmother. There is no history of Diabetes, Hypertension, or Colon cancer.  ROS:   Please see the history of present illness.    All other systems reviewed and are negative.  EKGs/Labs/Other Studies Reviewed:    The following studies were reviewed today: Hospital records, pacemaker interrogation  May 26, 2020 pacemaker interrogation in office personally reviewed A sensed V paced presenting rhythm DDDR 60 to 130 bpm Since the prior check there is been no additional atrial fibrillation episodes Battery longevity estimated at 12.6 years Atrial lead impedance 475 ohms, capture threshold 0.875 V at 0.4 ms, sensing 5 mV Ventricular lead impedance 551 ohms, capture threshold 0.375 V at 0.4 seconds, sensing at 4.3 mV  EKG:  The ekg ordered today demonstrates normal functioning dual-chamber permanent pacemaker.  Recent Labs: 06/23/2019: ALT 19; Platelets 208 04/12/2020: BUN 24; Creatinine, Ser 1.20; Hemoglobin 13.9; Potassium 4.7; Sodium 143  Recent Lipid Panel    Component Value Date/Time   CHOL 182 06/23/2019 0919   CHOL 202 (H) 04/29/2015 0734   TRIG 180 (H) 06/23/2019 0919   TRIG 165 (H) 04/29/2015 0734   HDL 51 06/23/2019 0919   HDL 47 04/29/2015 0734   CHOLHDL 3.6 06/23/2019 0919   CHOLHDL 3 08/24/2016 0727   VLDL 21.6 08/24/2016 0727   LDLCALC 100 (H) 06/23/2019 0919   LDLCALC 122 (H) 04/29/2015 0734   LDLDIRECT 78.7 03/28/2013 0732    Physical Exam:    VS:  BP 110/60   Pulse 68   Ht 5\' 11"  (1.803 Hill)   Wt 208 lb (94.3 kg)   SpO2 98%   BMI 29.01 kg/Hill     Wt Readings from Last 3 Encounters:  05/26/20 208 lb (94.3 kg)  05/12/20 208 lb 9.6 oz (94.6 kg)  04/12/20 200 lb (90.7 kg)     GEN:  Well nourished, well developed in no acute distress HEENT: Normal NECK: No JVD; No carotid bruits LYMPHATICS: No lymphadenopathy CARDIAC: RRR, no murmurs, rubs, gallops RESPIRATORY:  Clear to auscultation without rales, wheezing or rhonchi  ABDOMEN:  Soft, non-tender, non-distended MUSCULOSKELETAL:  No edema; No deformity  SKIN: Warm and dry NEUROLOGIC:  Alert and oriented x 3 PSYCHIATRIC:  Normal affect   ASSESSMENT:    1. Complete heart block (HCC)   2. Pacemaker   3. Persistent atrial fibrillation (HCC)    PLAN:    In order of problems listed above:  1. Complete heart block status post permanent pacemaker Pacemaker is well-functioning on today's device check in the clinic.  No programming changes made  2.  Postop atrial fibrillation Now back in normal rhythm.  Off of amiodarone.  He continues to take anticoagulant given his DVT.  Follow-up 6 months  Medication Adjustments/Labs and Tests Ordered: Current medicines are reviewed at length with the patient today.  Concerns regarding medicines are outlined above.  Orders Placed This Encounter  Procedures  . CUP PACEART INCLINIC DEVICE CHECK  . EKG 12-Lead   No orders of the defined types were placed in this encounter.    Signed, 04/14/20, MD, [facility]  05/26/2020 7:52 PM    Electrophysiology [facility]

## 2020-06-21 ENCOUNTER — Telehealth: Payer: Self-pay | Admitting: Cardiology

## 2020-06-21 NOTE — Telephone Encounter (Signed)
Returned call to pt.  He was confused about appt scheduled for a remote device check.  Advised it was his remote 91 day check for his device and he did not need to do anything other than sleep in his bed that night.  Pt also requesting phone number for [employer].  Phone number given.

## 2020-06-21 NOTE — Telephone Encounter (Signed)
Patient calling to speak with Dr. Lovena Neighbours nurse. Did not want me to take a message, just asked for a call back.

## 2020-06-23 ENCOUNTER — Ambulatory Visit (INDEPENDENT_AMBULATORY_CARE_PROVIDER_SITE_OTHER): Payer: BC Managed Care – PPO

## 2020-06-23 DIAGNOSIS — I442 Atrioventricular block, complete: Secondary | ICD-10-CM

## 2020-06-23 LAB — CUP PACEART REMOTE DEVICE CHECK
Battery Remaining Longevity: 152 mo
Battery Voltage: 3.17 V
Brady Statistic AP VP Percent: 43.88 %
Brady Statistic AP VS Percent: 0.01 %
Brady Statistic AS VP Percent: 55.97 %
Brady Statistic AS VS Percent: 0.14 %
Brady Statistic RA Percent Paced: 43.65 %
Brady Statistic RV Percent Paced: 99.85 %
Date Time Interrogation Session: 20211109213230
Implantable Lead Implant Date: 20210604
Implantable Lead Implant Date: 20210604
Implantable Lead Location: 753859
Implantable Lead Location: 753860
Implantable Lead Model: 4076
Implantable Lead Model: 4076
Implantable Pulse Generator Implant Date: 20210604
Lead Channel Impedance Value: 285 Ohm
Lead Channel Impedance Value: 532 Ohm
Lead Channel Impedance Value: 532 Ohm
Lead Channel Impedance Value: 608 Ohm
Lead Channel Pacing Threshold Amplitude: 0.25 V
Lead Channel Pacing Threshold Amplitude: 0.875 V
Lead Channel Pacing Threshold Pulse Width: 0.4 ms
Lead Channel Pacing Threshold Pulse Width: 0.4 ms
Lead Channel Sensing Intrinsic Amplitude: 4.25 mV
Lead Channel Sensing Intrinsic Amplitude: 4.25 mV
Lead Channel Sensing Intrinsic Amplitude: 4.875 mV
Lead Channel Sensing Intrinsic Amplitude: 4.875 mV
Lead Channel Setting Pacing Amplitude: 1.75 V
Lead Channel Setting Pacing Amplitude: 2 V
Lead Channel Setting Pacing Pulse Width: 0.4 ms
Lead Channel Setting Sensing Sensitivity: 0.9 mV

## 2020-06-24 NOTE — Progress Notes (Signed)
Remote pacemaker transmission.   

## 2020-08-12 NOTE — Progress Notes (Signed)
Date:  08/18/2020   ID:  Chase Hill, DOB 06/12/1946, MRN [id number]  PCP:  Chase Hill, Daniel, MD  Cardiologist:  Chase SwazilandJordan, MD  Electrophysiologist:  Chase PrudeAMERON T LAMBERT, MD   Evaluation Performed:  Follow-Up Visit   Chief Complaint:  HOCM  History of Present Illness:    Chase Hill is a 74 y.o. male with PMH of hypertrophic cardiomyopathy, history of DVT/PE, TAA, hyperlipidemia, RBBB, Gilbert's syndrome, hypothyroidism.  He was previously followed by Chase. Shirlee Hill.  Stress Myoview in 2011 showed inferior and apical ischemia.  Subsequent cardiac cath showed no obstructive disease.  In 2014, echocardiogram demonstrated basal septal hypertrophy with mild LV outflow tract gradient and chordal versus valvular SAM, moderate diastolic dysfunction.  Cardiac MRI obtained in June 2014 showed mild focal basal septal hypertrophy with systolic anterior motion of the mitral valve and mild MR.  There was no significant delayed enhancement.  He has been intolerant of pravastatin due to myalgia.  He also developed  fatigue and orthostasis on beta-blocker.  Due to increased septal hypertrophy and outflow gradient on previous echo in July 2018, he was started on Cardizem.  This was later discontinued due to concern of conduction disease with first-degree AV block, left anterior fascicular block and a right bundle branch block. He later underwent laser ablation of varicose veins by Chase. Darrick Hill. He was diagnosed with PE on CT angiogram of the chest in January 2029 and was placed on Xarelto.  PE was felt to be provoked by laser vein treatment.  He completed 6 months of therapy with Xarelto and switch to aspirin.  CT angiogram of the chest recently showed 4.6 cm thoracic aortic aneurysm.  Recent lower extremity venous Doppler obtained on 04/10/2019 was negative for DVT.  Repeat echocardiogram obtained on 04/24/2019, EF was greater than 65%, severe LVH, significant outflow obstruction at the mid cavity and  LVOT  with peak gradient of at least 100 mmHg, systolic anterior motion of mitral leaflet, mild dilatation of the ascending aorta measuring 43 mm.   We did proceed with cardiac cath on 06/26/19. This showed normal coronary anatomy. Normal LV and right heart pressures. LVOT gradient 20 mm Hg by cath. MRI was also done showing typical features of HOCM with predominant sigmoid septum and SAM. There was only mild late gadolinium enhancement. An Event monitor was placed with results noted below.  He was subsequently referred to Chase Hill and underwent a Septal myectomy on 01/14/20 by Chase Hill. On 01/16/20 he had a left sided DDD pacemaker (Medtronic XT AZT MRI) placed for development of complete heart block. He did well and was DC on 01/20/20. He was readmitted 6/24-6/29/21 with acute pulmonary embolus. He had a 10 day history of increased SOB. Was noted on pacemaker check to have paroxysmal AFib. Was started on Eliquis but symptoms progressed and he presented to the ED. In the emergency room he remained hemodynamically stable. Labs revealed a hemoglobin 12.4, WBC 10.9, sodium 134, ALT 161, AST 130. Troponin returned at 36 with a negative delta of 3. NT proBNP returned at 3471. EKG showed atrial flutter. Chest x-ray showed minimal fibrosis/atelectasis in left lung base, otherwise clear. Chest CT showed acute multilobe for segmental and subsegmental thrombo emboli affecting all lobes. Chest CT and bedside echocardiogram did not reveal evidence of right heart strain. The patient received IV fluids, loading dose of heparin and was initiated on high-intensity heparin GTT. 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 The patient was transitioned to bivalirudin. Lupus anticoagulant was negative. Upon discharge the patient transition back to apixaban 10 mg b.i.d. x7 days and then 5 mg b.i.d. x3 months or indefinitely for atrial fibrillation.  LE doppler showed extensive DVT in left leg from the common femoral vein down.The patient's outpatient cardiologist, Chase Hill, was contacted regarding starting amiodarone while the patient was hospitalized. He recommended initiating amiodarone 200 mg b.i.d. x7 days followed by 200 mg daily with Cardiology follow up.  Since then he has had persistent Afib. Was seen by Chase Hill who initially increased his amiodarone. He then developed side effects on the amiodarone with tremors, difficulty writing and typing,  and imbalance. We proceeded with DCCV on 04/12/20 and amiodarone was discontinued. He had follow up with Chase Hill in October and was maintaining NSR. Last pacer check in November showed Afib burden < 0.1%.   On follow up today he states that he is doing much  better. He is exercising regularly walking and playing gold.  His breathing is much better.  His energy level is good. No pain.Marland Kitchen He still notes some swelling in his left leg with some redness in the evening but no pain. Does wear compression hose.    Past Medical History:  Diagnosis Date  . ALLERGIC RHINITIS 08/16/2009   Qualifier: Diagnosis of  By: Alwyn Ren MD, Chrissie Noa   Onset:as a child Character: perennial but increased seasonally Triggers: dust mites; minor as pollen Allergy testing: yes X 2; as child & in 1995, Chase Jethro Bolus. Never took shots Maintenance medications/ response:Loratidine daily Smoking history:never Family history pulmonary disease: no     . Cervical radiculopathy at C7    PMH of  . Chest pain 11/22/2012   Evaluated by Chase Marca Ancona, Cardiology 2014 ECHO, MR : ? hypertrophic cardiomyopathy Rx: Beta blocker   . Diverticulosis    colonoscopy 10-15-2006  . DIVERTICULOSIS, COLON 08/16/2009   Qualifier: Diagnosis of  By: Alwyn Ren MD, Chrissie Noa   Chase Jarold Motto, GI    . DVT (deep venous thrombosis) (HCC)   . ELECTROCARDIOGRAM, ABNORMAL 08/16/2009   Qualifier: Diagnosis of  By: Alwyn Ren MD, William   Right bundle branch block, left  posterior fascicular block( bifascicular block), not present 01/17/2007; noted 08/16/2009    . GERD (gastroesophageal reflux disease)   . Gilbert syndrome   . GILBERT'S SYNDROME 01/14/2007   Qualifier: Diagnosis of  By: Alwyn Ren MD, Chrissie Noa    . Hemorrhoids   . Hyperlipidemia   . Hypothyroidism   . Obstructive hypertrophic cardiomyopathy (HCC) 01/15/2013  . Perennial allergic rhinitis with seasonal variation   . Pulmonary embolism (HCC)   . REFLUX, ESOPHAGEAL 01/14/2007   Qualifier: Diagnosis of  By: Alwyn Ren MD, Chrissie Noa    . Thoracic aortic aneurysm without rupture (HCC) 03/20/2017  . THYROID FUNCTION TEST, ABNORMAL 09/07/2010   Qualifier: Diagnosis of  By: Alwyn Ren MD, Chrissie Noa     . Varicose veins of right lower extremity with complications 02/04/2018   Past Surgical History:  Procedure Laterality Date  . CARDIOVERSION N/A 04/12/2020   Procedure: CARDIOVERSION;  Surgeon: Jodelle Red, MD;  Location: [facility] ENDOSCOPY;  Service: Cardiovascular;  Laterality: N/A;  . COLONOSCOPY  2010   Tics  . ENDOVENOUS ABLATION SAPHENOUS VEIN W/ LASER Right 08/21/2018   endovenous laser ablation right greater saphenous vein and stab phlebectomy > 20 incisions right leg by Fabienne Bruns MD   . RIGHT/LEFT HEART CATH AND CORONARY ANGIOGRAPHY N/A 06/26/2019  Procedure: RIGHT/LEFT HEART CATH AND CORONARY ANGIOGRAPHY;  Surgeon: Swaziland, Chase M, MD;  Location: [facility] INVASIVE CV LAB;  Service: Cardiovascular;  Laterality: N/A;  . WISDOM TOOTH EXTRACTION       Current Meds  Medication Sig  . docusate sodium (COLACE) 100 MG capsule Take 200 mg by mouth 2 (two) times daily as needed (constipation).   Marland Kitchen ELIQUIS 5 MG TABS tablet Take 5 mg by mouth 2 (two) times daily.  Marland Kitchen levothyroxine (SYNTHROID) 150 MCG tablet Take 150 mcg by mouth daily.  Marland Kitchen loratadine (CLARITIN) 10 MG tablet Take 10 mg by mouth daily after breakfast.   . Melatonin 5 MG CAPS Take 5 mg by mouth at bedtime as needed (sleep).  . Multiple Vitamin  (MULTIVITAMIN WITH MINERALS) TABS tablet Take 1 tablet by mouth every evening.  . rosuvastatin (CRESTOR) 5 MG tablet Take 5 mg by mouth every Monday, Wednesday, and Friday at 8 PM. BEDTIME.     Allergies:   Patient has no known allergies.   Social History   Tobacco Use  . Smoking status: Never Smoker  . Smokeless tobacco: Never Used  Vaping Use  . Vaping Use: Never used  Substance Use Topics  . Alcohol use: Yes    Alcohol/week: 2.0 standard drinks    Types: 2 Glasses of wine per week    Comment:  < 3 /week , only socially  . Drug use: No     Family Hx: The patient's family history includes Breast cancer in his mother; Congestive Heart Failure in his father; Coronary artery disease in his father and paternal aunt; Stroke (age of onset: 21) in his maternal grandmother. There is no history of Diabetes, Hypertension, or Colon cancer.  ROS:   Please see the history of present illness.    All other systems reviewed and are negative.   Prior CV studies:   The following studies were reviewed today:  CTA of chest 02/19/2019 IMPRESSION: 1. No change in caliber of the tubular ascending thoracic aorta, measuring 4.6 x 4.3 cm, not significantly changed in caliber, measuring 4.5 x 4.2 cm on examinations dating back to 03/23/2017. The sinuses of Valsalva measure up to 4.4 cm in caliber. Normal caliber of the aortic valve. The descending thoracic aorta is normal in caliber. No significant atherosclerosis.  2. Examination is not tailored for the evaluation of pulmonary embolism, however there is no obvious residual pulmonary embolism in the right lower lobe at the site of embolism seen on prior examination dated 09/11/2018.  3. Stable, benign small pulmonary nodules.   Venous doppler 04/10/2019 Summary: Right: Successful vein closure. There is no evidence of deep vein thrombosis in the lower extremity. No cystic structure found in the popliteal fossa. No significant change  compared to previous study. Left: No evidence of common femoral vein obstruction.  *See table(s) above for measurements and observations.   Echo 04/24/2019 IMPRESSIONS 1. The left ventricle has hyperdynamic systolic function, with an ejection fraction of >65%. The cavity size was normal. There is severely increased left ventricular wall thickness. Left ventricular diastolic Doppler parameters are consistent with  impaired relaxation. 2. LVOT is narrowed The LV is severely hypertrophied. There is significant outflow obstruction at mid cavity and through LVOT with peak gradient at least 100 mm Hg. All consistent with HOCM. 3. The right ventricle has normal systolic function. The cavity was normal. There is no increase in right ventricular wall thickness. 4. Left atrial size was mildly dilated. 5. There is systolic anterior motion  of mitral leaflets. 6. The aortic valve is tricuspid. Mild thickening of the aortic valve. Aortic valve regurgitation is mild by color flow Doppler. 7. The aorta is abnormal unless otherwise noted. 8. The ascending aorta is normal in size and structure. 9. There is mild dilatation of the ascending aorta measuring 43 mm.   Cardiac cath 06/26/19:  RIGHT/LEFT HEART CATH AND CORONARY ANGIOGRAPHY  Conclusion    The left ventricular systolic function is normal.  LV end diastolic pressure is normal.  The left ventricular ejection fraction is 55-65% by visual estimate.   1. Normal coronary anatomy 2. Normal LV function' 3. 20 mm Hg LVOT gradient at rest. Typical Brockenbrough sign post PVC. 4. Normal/low LV filling pressures 5. Normal right  Heart pressures 6. Normal cardiac output 7. Moderate MR but post PVC  Plan: recommend further evaluation with cardiac MRI and event monitor.     CLINICAL DATA:  Hypertrophic cardiomyopathy suspected, further testing  COMPARISON: No images available from prior cardiac MRI for  comparison  EXAM: CARDIAC MRI  TECHNIQUE: The patient was scanned on a 1.5 Tesla GE magnet. A dedicated cardiac coil was used. Functional imaging was done using Fiesta sequences. 2,3, and 4 chamber views were done to assess for RWMA's. Modified Simpson's rule using a short axis stack was used to calculate an ejection fraction on a dedicated work [employer]. The patient received 10mL GADAVIST GADOBUTROL 1 MMOL/ML IV SOLN. After 10 minutes inversion recovery sequences were used to assess for infiltration and scar tissue.  CONTRAST:  10mL GADAVIST GADOBUTROL 1 MMOL/ML IV SOLN  FINDINGS: LEFT VENTRICLE:  Normal left ventricular size. Hyperdynamic systolic function (LVEF = 69%).  There are no regional wall motion abnormalities.  Late gadolinium enhancement in the left ventricular myocardium is seen in the inferior RV insertion point at the mid ventricle and apex. This finding is seen in an area of increased myocardial wall thickness. Differential diagnosis includes fibrosis secondary to hypertrophic cardiomyopathy vs increased pulmonary pressures.  Normal T1 myocardial nulling kinetics suggest against a diagnosis of cardiac amyloidosis.  Maximal wall thickness: 18 mm  Location: Basal septum.  There is systolic anterior motion of the mitral valve. Flow dephasing suggests left ventricular outflow tract obstruction.  Mitral regurgitation is present and posteriorly directed.  No evidence of left ventricular apical aneurysm.  Findings are consistent with hypertrophic cardiomyopathy with obstruction. Morphologic subtype: Sigmoid subtype.  RIGHT VENTRICLE:  Normal right ventricular size, thickness and systolic function (RVEF = 63%).  There are no regional wall motion abnormalities.  No delayed myocardial enhancement in the right ventricle.  ATRIA:  Mildly dilated left atrial chamber size.  normal right atrial  size.  VALVES:  Systolic anterior motion of the mitral valve with posteriorly directed mitral regurgitation, qualitatively mild.  Qualitatively trivial aortic valve regurgitation by flow dephasing.  PERICARDIUM:  Normal pericardium.  No pericardial effusion.  AORTA:  Mid ascending aorta measures 43 mm in a sagittal transverse plane. Orthogonal measurements unable to be performed for true axial measurement.  OTHER:  MEASUREMENTS:  LVEDV: 145 ml  LVESV: 45 ml  SV: 100 ml  CO: 6 L/min  Myocardial mass: 184 g  RVEDV: 100 ml  RVEDS: 38 ml  RVSV: 63 mL  IMPRESSION: 1.  Hyperdynamic left ventricular systolic function.  LVEF 69%.  2.  Normal right ventricular chamber size and function.  RVEF 63%.  3. Findings consistent with hypertrophic cardiomyopathy, sigmoid subtype, maximal wall thickness 18 mm at the basal left ventricular  septum.  4. Left ventricular outflow tract obstruction. Flow dephasing seen in the LVOT with systolic anterior motion of the mitral valve, and posteriorly directed mitral valve regurgitation.  5. Delayed myocardial enhancement is seen in an area of increased left ventricular wall thickness at the mid ventricle, at the inferior RV insertion point. This finding could represent fibrosis in the setting hypertrophic cardiomyopathy vs. increased pulmonary pressures.  6.  Mid ascending aorta measures 43 mm in a transverse plane.   Electronically Signed   By: Weston Brass  Event monitor 08/19/19.Study Highlights   Normal sinus rhythm with first degree AV block  Episodes of second degree AV block Mobitz type 1  PVCs. One run of AIVR  PACs with short bursts of PACs- longest 6 beats.  No Afib noted- episodes noted on monitor appear to be artifact.   Echo 01/19/20:Echocardiogram 01/19/2020 Final Impressions 1. Hypertrophic cardiomyopathy status post left ventricular septal myectomy (14-Jan-2020). 2. Systolic  anterior motion of mitral apparatus with trivial mitral valve regurgitation. No dynamic left ventricular outflow tract obstruction at rest. 3. Mild aortic valve regurgitation (central jet). ERO 0.07 cm2, regurgitant volume 19 mL. 4. Normal left ventricular chamber size, calculated 2-D linear ejection fraction 70 %. 5. Normal right ventricular chamber size, mildly reduced systolic function, estimated right ventricular systolic pressure 22 mmHg (systolic blood pressure 129 mmHg). 6. Mildly enlarged mid ascending aorta diameter (diameter 45 mm at mid level). Upper limits of normal for age, sex, and BSA = 43 mm. 7. No pericardial effusion.    Echo 01/19/20: Final Impressions  1. Hypertrophic cardiomyopathy status post left ventricular septal myectomy (14-Jan-2020).  2. Systolic anterior motion of mitral apparatus with trivial mitral valve regurgitation. No  dynamic left ventricular outflow tract obstruction at rest.  3. Mild aortic valve regurgitation (central jet). ERO 0.07 cm^2, regurgitant volume 19 mL.  4. Normal left ventricular chamber size, calculated 2-D linear ejection fraction 70 %.  5. Normal right ventricular chamber size, mildly reduced systolic function, estimated right  ventricular systolic pressure 22 mmHg (systolic blood pressure 129 mmHg).  6. Mildly enlarged mid ascending aorta diameter (diameter 45 mm at mid level). Upper limits of  normal for age, sex, and BSA = 43 mm.  7. No pericardial effusion.   Labs/Other Tests and Data Reviewed:    EKG:  No ECG reviewed.  Recent Labs: 04/12/2020: BUN 24; Creatinine, Ser 1.20; Hemoglobin 13.9; Potassium 4.7; Sodium 143   Recent Lipid Panel Lab Results  Component Value Date/Time   CHOL 182 06/23/2019 09:19 AM   CHOL 202 (H) 04/29/2015 07:34 AM   TRIG 180 (H) 06/23/2019 09:19 AM   TRIG 165 (H) 04/29/2015 07:34 AM   HDL 51 06/23/2019 09:19 AM   HDL 47 04/29/2015 07:34 AM   CHOLHDL 3.6 06/23/2019 09:19 AM   CHOLHDL 3 08/24/2016  07:27 AM   LDLCALC 100 (H) 06/23/2019 09:19 AM   LDLCALC 122 (H) 04/29/2015 07:34 AM   LDLDIRECT 78.7 03/28/2013 07:32 AM   Dated 08/12/19: creatinine 1.2. otherwise CMET normal. CBC and TSH normal. Cholesterol 177, triglycerides 209, HDL 43, LDL 92.  Dated 02/19/20: Hgb 12.9, creatinine 1.3. TSH 5.22. ALT 25.  Ecg today shows AV pacing rate 65. I have personally reviewed and interpreted this study.   Wt Readings from Last 3 Encounters:  08/18/20 210 lb (95.3 kg)  05/26/20 208 lb (94.3 kg)  05/12/20 208 lb 9.6 oz (94.6 kg)     Objective:    Vital Signs:  BP 104/72 (  BP Location: Left Arm, Patient Position: Sitting, Cuff Size: Normal)   Pulse 74   Ht 5\' 11"  (1.803 m)   Wt 210 lb (95.3 kg)   BMI 29.29 kg/m    GENERAL:  Well appearing WM in NAD HEENT:  PERRL, EOMI, sclera are clear. Oropharynx is clear. NECK:  No jugular venous distention, carotid upstroke brisk and symmetric, no bruits, no thyromegaly or adenopathy LUNGS:  Clear to auscultation bilaterally CHEST:  Unremarkable HEART:  RRR,  PMI not displaced or sustained,S1 and S2 within normal limits, no S3, no S4: no clicks, no rubs, no murmurs.  ABD:  Soft, nontender. BS +, no masses or bruits. No hepatomegaly, no splenomegaly EXT:  2 + pulses throughout, trace LLE edema, no cyanosis no clubbing no redness SKIN:  Warm and dry.  No rashes NEURO:  Alert and oriented x 3. Cranial nerves II through XII intact. PSYCH:  Cognitively intact   ASSESSMENT & PLAN:    1. Hypertrophic cardiomyopathy:  with marked increase in LVOT gradient 100 mm Hg this year. Symptom of dyspnea on exertion and dizziness with exertion.  Intolerant of beta blocker in the past due to dizziness. Now s/p septal Myectomy at [facility] in June 2021. S/p DDDR pacemaker for CHB which was anticipated. Course complicated by development of large Left leg DVT and pulmonary embolus. This was associated with  AFib/flutter.On Eliquis.  Echo post op at [facility] on 01/19/20  showed no outflow gradient and trivial MR. Normal EF. Clinically doing very well.   2. Thoracic aortic aneurysm: Mildly dilated aorta on recent echocardiogram measuring up to 43 mm. Follow up yearly with Echo  3. Hyperlipidemia: Continue Crestor.  4. Hypothyroidism: On Synthroid. TSH OK   5. History of PE past- more recent DVT/ PE post op heart surgery. Repeat venous doppler showed persistent common femoral and popliteal DVT but improved signal distally. We had discussed early on possible thrombectomy but he deferred. Will continue Eliquis chronically and support hose.   6. Second degree AV block Mobitz type 1 progressing to CHB post septal myectomy. S/p DDDR pacemaker with Medtronic generator. Pacer check on 06/22/20 was OK.   7. Paroxysmal Afib/flutter. Post op heart surgery and in setting of acute PE. Was in persistent Afib/flutter in follow up. S/p successful DCCV on 04/12/20. Maintaining NSR so far. Amiodarone discontinued due to side effects. If Afib recurs will need to consider alternative therapies.   Medication Adjustments/Labs and Tests Ordered: Current medicines are reviewed at length with the patient today.  Concerns regarding medicines are outlined above.   Tests Ordered: No orders of the defined types were placed in this encounter.   Medication Changes: No orders of the defined types were placed in this encounter.   Follow Up: 6 months with Echo prior to visit.  Signed, Chase 04/14/20, MD  08/18/2020 9:27 AM    [facility]

## 2020-08-18 ENCOUNTER — Encounter: Payer: Self-pay | Admitting: Cardiology

## 2020-08-18 ENCOUNTER — Other Ambulatory Visit: Payer: Self-pay

## 2020-08-18 ENCOUNTER — Ambulatory Visit (INDEPENDENT_AMBULATORY_CARE_PROVIDER_SITE_OTHER): Payer: BC Managed Care – PPO | Admitting: Cardiology

## 2020-08-18 VITALS — BP 104/72 | HR 74 | Ht 71.0 in | Wt 210.0 lb

## 2020-08-18 DIAGNOSIS — I421 Obstructive hypertrophic cardiomyopathy: Secondary | ICD-10-CM

## 2020-08-18 DIAGNOSIS — I422 Other hypertrophic cardiomyopathy: Secondary | ICD-10-CM

## 2020-08-18 DIAGNOSIS — Z950 Presence of cardiac pacemaker: Secondary | ICD-10-CM | POA: Diagnosis not present

## 2020-08-18 DIAGNOSIS — I480 Paroxysmal atrial fibrillation: Secondary | ICD-10-CM

## 2020-08-18 DIAGNOSIS — I824Y9 Acute embolism and thrombosis of unspecified deep veins of unspecified proximal lower extremity: Secondary | ICD-10-CM

## 2020-08-18 DIAGNOSIS — I442 Atrioventricular block, complete: Secondary | ICD-10-CM

## 2020-08-19 DIAGNOSIS — E785 Hyperlipidemia, unspecified: Secondary | ICD-10-CM | POA: Diagnosis not present

## 2020-08-19 DIAGNOSIS — Z125 Encounter for screening for malignant neoplasm of prostate: Secondary | ICD-10-CM | POA: Diagnosis not present

## 2020-08-19 DIAGNOSIS — E039 Hypothyroidism, unspecified: Secondary | ICD-10-CM | POA: Diagnosis not present

## 2020-08-24 DIAGNOSIS — Z0000 Encounter for general adult medical examination without abnormal findings: Secondary | ICD-10-CM | POA: Diagnosis not present

## 2020-08-24 DIAGNOSIS — Z1212 Encounter for screening for malignant neoplasm of rectum: Secondary | ICD-10-CM | POA: Diagnosis not present

## 2020-08-24 DIAGNOSIS — R82998 Other abnormal findings in urine: Secondary | ICD-10-CM | POA: Diagnosis not present

## 2020-08-24 DIAGNOSIS — I739 Peripheral vascular disease, unspecified: Secondary | ICD-10-CM | POA: Diagnosis not present

## 2020-09-22 ENCOUNTER — Ambulatory Visit (INDEPENDENT_AMBULATORY_CARE_PROVIDER_SITE_OTHER): Payer: BC Managed Care – PPO

## 2020-09-22 DIAGNOSIS — I442 Atrioventricular block, complete: Secondary | ICD-10-CM | POA: Diagnosis not present

## 2020-09-23 LAB — CUP PACEART REMOTE DEVICE CHECK
Battery Remaining Longevity: 148 mo
Battery Voltage: 3.13 V
Brady Statistic AP VP Percent: 28.45 %
Brady Statistic AP VS Percent: 0.06 %
Brady Statistic AS VP Percent: 68.88 %
Brady Statistic AS VS Percent: 2.61 %
Brady Statistic RA Percent Paced: 29.58 %
Brady Statistic RV Percent Paced: 97.34 %
Date Time Interrogation Session: 20220208220001
Implantable Lead Implant Date: 20210604
Implantable Lead Implant Date: 20210604
Implantable Lead Location: 753859
Implantable Lead Location: 753860
Implantable Lead Model: 4076
Implantable Lead Model: 4076
Implantable Pulse Generator Implant Date: 20210604
Lead Channel Impedance Value: 285 Ohm
Lead Channel Impedance Value: 513 Ohm
Lead Channel Impedance Value: 513 Ohm
Lead Channel Impedance Value: 589 Ohm
Lead Channel Pacing Threshold Amplitude: 0.375 V
Lead Channel Pacing Threshold Amplitude: 0.75 V
Lead Channel Pacing Threshold Pulse Width: 0.4 ms
Lead Channel Pacing Threshold Pulse Width: 0.4 ms
Lead Channel Sensing Intrinsic Amplitude: 4.25 mV
Lead Channel Sensing Intrinsic Amplitude: 4.25 mV
Lead Channel Sensing Intrinsic Amplitude: 5.75 mV
Lead Channel Sensing Intrinsic Amplitude: 5.75 mV
Lead Channel Setting Pacing Amplitude: 1.5 V
Lead Channel Setting Pacing Amplitude: 2 V
Lead Channel Setting Pacing Pulse Width: 0.4 ms
Lead Channel Setting Sensing Sensitivity: 0.9 mV

## 2020-09-27 NOTE — Progress Notes (Signed)
Remote pacemaker transmission.   

## 2020-11-06 DIAGNOSIS — Z20822 Contact with and (suspected) exposure to covid-19: Secondary | ICD-10-CM | POA: Diagnosis not present

## 2020-12-22 ENCOUNTER — Ambulatory Visit (INDEPENDENT_AMBULATORY_CARE_PROVIDER_SITE_OTHER): Payer: BC Managed Care – PPO

## 2020-12-22 DIAGNOSIS — I442 Atrioventricular block, complete: Secondary | ICD-10-CM | POA: Diagnosis not present

## 2020-12-23 LAB — CUP PACEART REMOTE DEVICE CHECK
Battery Remaining Longevity: 143 mo
Battery Voltage: 3.08 V
Brady Statistic AP VP Percent: 28.15 %
Brady Statistic AP VS Percent: 0.05 %
Brady Statistic AS VP Percent: 70.62 %
Brady Statistic AS VS Percent: 1.18 %
Brady Statistic RA Percent Paced: 28.37 %
Brady Statistic RV Percent Paced: 98.77 %
Date Time Interrogation Session: 20220510232645
Implantable Lead Implant Date: 20210604
Implantable Lead Implant Date: 20210604
Implantable Lead Location: 753859
Implantable Lead Location: 753860
Implantable Lead Model: 4076
Implantable Lead Model: 4076
Implantable Pulse Generator Implant Date: 20210604
Lead Channel Impedance Value: 285 Ohm
Lead Channel Impedance Value: 456 Ohm
Lead Channel Impedance Value: 494 Ohm
Lead Channel Impedance Value: 570 Ohm
Lead Channel Pacing Threshold Amplitude: 0.5 V
Lead Channel Pacing Threshold Amplitude: 0.625 V
Lead Channel Pacing Threshold Pulse Width: 0.4 ms
Lead Channel Pacing Threshold Pulse Width: 0.4 ms
Lead Channel Sensing Intrinsic Amplitude: 4.25 mV
Lead Channel Sensing Intrinsic Amplitude: 4.25 mV
Lead Channel Sensing Intrinsic Amplitude: 5.625 mV
Lead Channel Sensing Intrinsic Amplitude: 5.625 mV
Lead Channel Setting Pacing Amplitude: 1.5 V
Lead Channel Setting Pacing Amplitude: 2 V
Lead Channel Setting Pacing Pulse Width: 0.4 ms
Lead Channel Setting Sensing Sensitivity: 0.9 mV

## 2021-01-13 ENCOUNTER — Telehealth: Payer: Self-pay | Admitting: Cardiology

## 2021-01-13 NOTE — Progress Notes (Signed)
Remote pacemaker transmission.   

## 2021-01-13 NOTE — Telephone Encounter (Signed)
Spoke with pt, he reports he woke this morning with his heart racing but when he checked his pulse it was 65 bpm so he thinks he had an atrial fib episode. He reports it lasted about 3-4 min and he could feel the fluttering in his chest but otherwise felt fine. He currently feel fine and reports this is the first time he has felt the atrial fib. He has a follow up appointment with both dr Swaziland and dr Lalla Brothers this month and is fine waiting for those appointments. Patient voiced understanding to call back if the episodes become more frequent or they last an extended period of time. Pt agreed with this plan.

## 2021-01-13 NOTE — Telephone Encounter (Signed)
PT is calling in with concerns about his health.PT states it is a urgent matter

## 2021-01-25 ENCOUNTER — Other Ambulatory Visit: Payer: Self-pay

## 2021-01-25 DIAGNOSIS — I824Y9 Acute embolism and thrombosis of unspecified deep veins of unspecified proximal lower extremity: Secondary | ICD-10-CM

## 2021-01-25 NOTE — Progress Notes (Signed)
Spoke to patient he requested lower ext venous doppler of left leg to check old DVT.Stated left lower leg slightly red and swollen.No pain.

## 2021-01-25 NOTE — Telephone Encounter (Signed)
Spoke to patient advised Dr.Jordan is out of office this week.Stated left lower leg slightly swollen and red.No pain.He would like a doppler to check status of old clot before he sees Dr.Jordan in August.Advised I will place order and scheduler will call back with appointment.

## 2021-01-28 ENCOUNTER — Other Ambulatory Visit: Payer: Self-pay

## 2021-01-28 ENCOUNTER — Encounter: Payer: Self-pay | Admitting: Cardiology

## 2021-01-28 ENCOUNTER — Ambulatory Visit (INDEPENDENT_AMBULATORY_CARE_PROVIDER_SITE_OTHER): Payer: BC Managed Care – PPO | Admitting: Cardiology

## 2021-01-28 VITALS — BP 124/76 | HR 67 | Ht 68.0 in | Wt 208.0 lb

## 2021-01-28 DIAGNOSIS — I421 Obstructive hypertrophic cardiomyopathy: Secondary | ICD-10-CM | POA: Diagnosis not present

## 2021-01-28 DIAGNOSIS — I442 Atrioventricular block, complete: Secondary | ICD-10-CM

## 2021-01-28 DIAGNOSIS — I480 Paroxysmal atrial fibrillation: Secondary | ICD-10-CM

## 2021-01-28 NOTE — Patient Instructions (Signed)
Medication Instructions:  No changes *If you need a refill on your cardiac medications before your next appointment, please call your pharmacy*   Lab Work: none If you have labs (blood work) drawn today and your tests are completely normal, you will receive your results only by: MyChart Message (if you have MyChart) OR A paper copy in the mail If you have any lab test that is abnormal or we need to change your treatment, we will call you to review the results.   Testing/Procedures: none   Follow-Up: At [facility], you and your health needs are our priority.  As part of our continuing mission to provide you with exceptional heart care, we have created designated Provider Care Teams.  These Care Teams include your primary Cardiologist (physician) and Advanced Practice Providers (APPs -  Physician Assistants and Nurse Practitioners) who all work together to provide you with the care you need, when you need it.  Your next appointment:   12 month(s)  The format for your next appointment:   In Person  Provider:   You may see Lanier Prude, MD or one of the following Advanced Practice Providers on your designated Care Team:    Francis Dowse, [place] Casimiro Needle "Mardelle Matte" Lanna Poche, [place]   Other Instructions

## 2021-01-28 NOTE — Progress Notes (Signed)
Electrophysiology Office Follow up Visit Note:    Date:  01/28/2021   ID:  Chase Hill, DOB [birth year], MRN [id number]  PCP:  [name], MD  [facility] Cardiologist:  Peter Swaziland, MD  [facility] Electrophysiologist:  Lanier Prude, MD    Interval History:    Chase Hill is a 75 y.o. male who presents for a follow up visit.  I last saw the patient May 26, 2020 for his atrial fibrillation.  We had previously tried amiodarone to help maintain normal rhythm but the patient did not tolerate the drug with significant neurologic side effects.  At the time of our last appointment he was off amiodarone but maintaining normal rhythm.  Since I last saw him there is a phone note documenting a probable atrial fibrillation episode that was symptomatic.  Today he tells me that he played golf time [place] recently and on the drive back he noted some left lower extremity swelling and redness.  This is the same like he had DVTs in the past.  The swelling and redness is now completely resolved.  He has been taking his Eliquis uninterrupted.  He also tells me there was an episode in the recent past where he woke up with a pounding sensation in his chest.  He was concerned that he was having an abnormal heart rhythm.  He continue with his day and ultimately the sensation resolved.    Past Medical History:  Diagnosis Date   ALLERGIC RHINITIS 08/16/2009   Qualifier: Diagnosis of  By: Alwyn Ren MD, Chrissie Noa   Onset:as a child Character: perennial but increased seasonally Triggers: dust mites; minor as pollen Allergy testing: yes X 2; as child & in 1995, Dr Jethro Bolus. Never took shots Maintenance medications/ response:Loratidine daily Smoking history:never Family history pulmonary disease: no      Cervical radiculopathy at C7    PMH of   Chest pain 11/22/2012   Evaluated by Dr Marca Ancona, Cardiology 2014 ECHO, MR : ? hypertrophic cardiomyopathy Rx: Beta blocker     Diverticulosis    colonoscopy 10-15-2006   DIVERTICULOSIS, COLON 08/16/2009   Qualifier: Diagnosis of  By: Alwyn Ren MD, Chrissie Noa   Dr Jarold Motto, GI     DVT (deep venous thrombosis) (HCC)    ELECTROCARDIOGRAM, ABNORMAL 08/16/2009   Qualifier: Diagnosis of  By: Alwyn Ren MD, William   Right bundle branch block, left posterior fascicular block( bifascicular block), not present 01/17/2007; noted 08/16/2009     GERD (gastroesophageal reflux disease)    Sullivan Lone syndrome    GILBERT'S SYNDROME 01/14/2007   Qualifier: Diagnosis of  By: Alwyn Ren MD, William     Hemorrhoids    Hyperlipidemia    Hypothyroidism    Obstructive hypertrophic cardiomyopathy (HCC) 01/15/2013   Perennial allergic rhinitis with seasonal variation    Pulmonary embolism (HCC)    REFLUX, ESOPHAGEAL 01/14/2007   Qualifier: Diagnosis of  By: Alwyn Ren MD, William     Thoracic aortic aneurysm without rupture (HCC) 03/20/2017   THYROID FUNCTION TEST, ABNORMAL 09/07/2010   Qualifier: Diagnosis of  By: Alwyn Ren MD, Chrissie Noa      Varicose veins of right lower extremity with complications 02/04/2018    Past Surgical History:  Procedure Laterality Date   CARDIOVERSION N/A 04/12/2020   Procedure: CARDIOVERSION;  Surgeon: Jodelle Red, MD;  Location: [facility] ENDOSCOPY;  Service: Cardiovascular;  Laterality: N/A;   COLONOSCOPY  2010   Tics   ENDOVENOUS ABLATION SAPHENOUS VEIN W/ LASER Right 08/21/2018   endovenous laser ablation right  greater saphenous vein and stab phlebectomy > 20 incisions right leg by Fabienne Bruns MD    RIGHT/LEFT HEART CATH AND CORONARY ANGIOGRAPHY N/A 06/26/2019   Procedure: RIGHT/LEFT HEART CATH AND CORONARY ANGIOGRAPHY;  Surgeon: Swaziland, Peter M, MD;  Location: [facility] INVASIVE CV LAB;  Service: Cardiovascular;  Laterality: N/A;   WISDOM TOOTH EXTRACTION      Current Medications: Current Meds  Medication Sig   docusate sodium (COLACE) 100 MG capsule Take 200 mg by mouth 2 (two) times daily as needed (constipation).    ELIQUIS 5 MG  TABS tablet Take 5 mg by mouth 2 (two) times daily.   levothyroxine (SYNTHROID) 150 MCG tablet Take 150 mcg by mouth daily.   loratadine (CLARITIN) 10 MG tablet Take 10 mg by mouth daily after breakfast.    Melatonin 5 MG CAPS Take 5 mg by mouth at bedtime as needed (sleep).   Multiple Vitamin (MULTIVITAMIN WITH MINERALS) TABS tablet Take 1 tablet by mouth every evening.   rosuvastatin (CRESTOR) 5 MG tablet Take 5 mg by mouth every Monday, Wednesday, and Friday at 8 PM. BEDTIME.     Allergies:   Patient has no known allergies.   Social History   Socioeconomic History   Marital status: Married    Spouse name: Not on file   Number of children: 2   Years of education: Not on file   Highest education level: Not on file  Occupational History   Occupation: LAWYER  Tobacco Use   Smoking status: Never   Smokeless tobacco: Never  Vaping Use   Vaping Use: Never used  Substance and Sexual Activity   Alcohol use: Yes    Alcohol/week: 2.0 standard drinks    Types: 2 Glasses of wine per week    Comment:  < 3 /week , only socially   Drug use: No   Sexual activity: Not on file  Other Topics Concern   Not on file  Social History Narrative   Daily caffeine    Social Determinants of Health   Financial Resource Strain: Not on file  Food Insecurity: Not on file  Transportation Needs: Not on file  Physical Activity: Not on file  Stress: Not on file  Social Connections: Not on file     Family History: The patient's family history includes Breast cancer in his mother; Congestive Heart Failure in his father; Coronary artery disease in his father and paternal aunt; Stroke (age of onset: 67) in his maternal grandmother. There is no history of Diabetes, Hypertension, or Colon cancer.  ROS:   Please see the history of present illness.    All other systems reviewed and are negative.  EKGs/Labs/Other Studies Reviewed:    The following studies were reviewed today:  January 28, 2021 device  interrogation in clinic personally reviewed Battery longevity stable Lead parameter stable No tachycardia events  EKG:  The ekg ordered today demonstrates AV sequential pacing  Recent Labs: 04/12/2020: BUN 24; Creatinine, Ser 1.20; Hemoglobin 13.9; Potassium 4.7; Sodium 143  Recent Lipid Panel    Component Value Date/Time   CHOL 182 06/23/2019 0919   CHOL 202 (H) 04/29/2015 0734   TRIG 180 (H) 06/23/2019 0919   TRIG 165 (H) 04/29/2015 0734   HDL 51 06/23/2019 0919   HDL 47 04/29/2015 0734   CHOLHDL 3.6 06/23/2019 0919   CHOLHDL 3 08/24/2016 0727   VLDL 21.6 08/24/2016 0727   LDLCALC 100 (H) 06/23/2019 0919   LDLCALC 122 (H) 04/29/2015 0734   LDLDIRECT 78.7  03/28/2013 0732    Physical Exam:    VS:  BP 124/76   Pulse 67   Ht 5\' 8"  (1.727 m)   Wt 208 lb (94.3 kg)   BMI 31.63 kg/m     Wt Readings from Last 3 Encounters:  01/28/21 208 lb (94.3 kg)  08/18/20 210 lb (95.3 kg)  05/26/20 208 lb (94.3 kg)     GEN:  Well nourished, well developed in no acute distress HEENT: Normal NECK: No JVD; No carotid bruits LYMPHATICS: No lymphadenopathy CARDIAC: RRR, no murmurs, rubs, gallops.  Lower extremities warm and nonedematous. RESPIRATORY:  Clear to auscultation without rales, wheezing or rhonchi  ABDOMEN: Soft, non-tender, non-distended MUSCULOSKELETAL:  No edema; No deformity  SKIN: Warm and dry NEUROLOGIC:  Alert and oriented x 3 PSYCHIATRIC:  Normal affect   ASSESSMENT:    1. Paroxysmal atrial fibrillation (HCC)   2. Obstructive hypertrophic cardiomyopathy (HCC)   3. Complete heart block (HCC)    PLAN:    In order of problems listed above:  Paroxysmal atrial fibrillation On Eliquis for stroke prophylaxis.  No episodes since last device interrogation.  2.  HOCM Doing well.  No obstructive symptoms today.  3.  Complete heart block post permanent pacemaker implant Pacemaker functioning well.  Continue remote interrogations.  I will plan to see him back in  person in 1 year or sooner as needed.  Total time spent with patient today 35 minutes. This includes reviewing records, evaluating the patient and coordinating care.   Medication Adjustments/Labs and Tests Ordered: Current medicines are reviewed at length with the patient today.  Concerns regarding medicines are outlined above.  Orders Placed This Encounter  Procedures   EKG 12-Lead    No orders of the defined types were placed in this encounter.    Signed, 05/28/20, MD, [facility], [facility] 01/28/2021 5:13 PM    Electrophysiology [facility]

## 2021-02-02 ENCOUNTER — Other Ambulatory Visit: Payer: Self-pay

## 2021-02-02 ENCOUNTER — Ambulatory Visit (HOSPITAL_COMMUNITY)
Admission: RE | Admit: 2021-02-02 | Discharge: 2021-02-02 | Disposition: A | Discharge status: Home / Self Care | Payer: BC Managed Care – PPO | Source: Ambulatory Visit | Attending: Cardiovascular Disease | Admitting: Cardiovascular Disease

## 2021-02-02 DIAGNOSIS — I824Y9 Acute embolism and thrombosis of unspecified deep veins of unspecified proximal lower extremity: Secondary | ICD-10-CM

## 2021-02-02 DIAGNOSIS — R600 Localized edema: Secondary | ICD-10-CM | POA: Diagnosis not present

## 2021-02-02 DIAGNOSIS — I824Y2 Acute embolism and thrombosis of unspecified deep veins of left proximal lower extremity: Secondary | ICD-10-CM | POA: Diagnosis not present

## 2021-02-16 DIAGNOSIS — E039 Hypothyroidism, unspecified: Secondary | ICD-10-CM | POA: Diagnosis not present

## 2021-02-16 DIAGNOSIS — E063 Autoimmune thyroiditis: Secondary | ICD-10-CM | POA: Diagnosis not present

## 2021-02-21 DIAGNOSIS — E063 Autoimmune thyroiditis: Secondary | ICD-10-CM | POA: Diagnosis not present

## 2021-02-21 DIAGNOSIS — E039 Hypothyroidism, unspecified: Secondary | ICD-10-CM | POA: Diagnosis not present

## 2021-03-10 ENCOUNTER — Other Ambulatory Visit (HOSPITAL_COMMUNITY): Payer: BC Managed Care – PPO

## 2021-03-15 ENCOUNTER — Ambulatory Visit (HOSPITAL_COMMUNITY): Payer: BC Managed Care – PPO | Attending: Cardiology

## 2021-03-15 ENCOUNTER — Other Ambulatory Visit: Payer: Self-pay

## 2021-03-15 DIAGNOSIS — I422 Other hypertrophic cardiomyopathy: Secondary | ICD-10-CM | POA: Diagnosis not present

## 2021-03-15 LAB — ECHOCARDIOGRAM COMPLETE
Area-P 1/2: 3.68 cm2
P 1/2 time: 648 msec
S' Lateral: 2.8 cm

## 2021-03-15 MED ORDER — PERFLUTREN LIPID MICROSPHERE
1.0000 mL | INTRAVENOUS | Status: AC | PRN
  Administered 2021-03-15: 1 mL via INTRAVENOUS

## 2021-03-21 DIAGNOSIS — R972 Elevated prostate specific antigen [PSA]: Secondary | ICD-10-CM | POA: Diagnosis not present

## 2021-03-23 ENCOUNTER — Ambulatory Visit (INDEPENDENT_AMBULATORY_CARE_PROVIDER_SITE_OTHER): Payer: BC Managed Care – PPO

## 2021-03-23 DIAGNOSIS — I442 Atrioventricular block, complete: Secondary | ICD-10-CM | POA: Diagnosis not present

## 2021-03-24 LAB — CUP PACEART REMOTE DEVICE CHECK
Battery Remaining Longevity: 138 mo
Battery Voltage: 3.04 V
Brady Statistic AP VP Percent: 32.93 %
Brady Statistic AP VS Percent: 0.13 %
Brady Statistic AS VP Percent: 64.9 %
Brady Statistic AS VS Percent: 2.04 %
Brady Statistic RA Percent Paced: 33.55 %
Brady Statistic RV Percent Paced: 97.82 %
Date Time Interrogation Session: 20220810024342
Implantable Lead Implant Date: 20210604
Implantable Lead Implant Date: 20210604
Implantable Lead Location: 753859
Implantable Lead Location: 753860
Implantable Lead Model: 4076
Implantable Lead Model: 4076
Implantable Pulse Generator Implant Date: 20210604
Lead Channel Impedance Value: 266 Ohm
Lead Channel Impedance Value: 456 Ohm
Lead Channel Impedance Value: 475 Ohm
Lead Channel Impedance Value: 532 Ohm
Lead Channel Pacing Threshold Amplitude: 0.5 V
Lead Channel Pacing Threshold Amplitude: 0.875 V
Lead Channel Pacing Threshold Pulse Width: 0.4 ms
Lead Channel Pacing Threshold Pulse Width: 0.4 ms
Lead Channel Sensing Intrinsic Amplitude: 5.125 mV
Lead Channel Sensing Intrinsic Amplitude: 5.125 mV
Lead Channel Sensing Intrinsic Amplitude: 6.125 mV
Lead Channel Sensing Intrinsic Amplitude: 6.125 mV
Lead Channel Setting Pacing Amplitude: 1.75 V
Lead Channel Setting Pacing Amplitude: 2 V
Lead Channel Setting Pacing Pulse Width: 0.4 ms
Lead Channel Setting Sensing Sensitivity: 0.9 mV

## 2021-04-08 NOTE — Progress Notes (Signed)
Date:  04/12/2021   ID:  Chase Hill, DOB 12/12/1945, MRN [id number]  PCP:  Chase Matin, MD  Cardiologist:  Chase Bugay [place], MD  Electrophysiologist:  Chase Prude, MD   Evaluation Performed:  Follow-Up Visit   Chief Complaint:  Chase Hill  History of Present Illness:    Chase Hill is a 75 y.o. male with PMH of hypertrophic cardiomyopathy, history of DVT/PE, TAA, hyperlipidemia, RBBB, Gilbert's syndrome, hypothyroidism.   Stress Myoview in 2011 showed inferior and apical ischemia.  Subsequent cardiac cath showed no obstructive disease.  In 2014, echocardiogram demonstrated basal septal hypertrophy with mild LV outflow tract gradient and chordal versus valvular SAM, moderate diastolic dysfunction.  Cardiac Hill obtained in June 2014 showed mild focal basal septal hypertrophy with systolic anterior motion of the mitral valve and mild MR.  There was no significant delayed enhancement.  He has been intolerant of pravastatin due to myalgia.  He also developed  fatigue and orthostasis on beta-blocker.   Due to increased septal hypertrophy and outflow gradient on previous echo in July 2018, he was started on Cardizem.  This was later discontinued due to concern of conduction disease with first-degree AV block, left anterior fascicular block and a right bundle branch block. He later underwent laser ablation of varicose veins by Chase. Darrick Hill. He was diagnosed with PE on CT angiogram of the chest in January 2019 and was placed on Xarelto.  PE was felt to be provoked by laser vein treatment.  He completed 6 months of therapy with Xarelto and switch to aspirin.  CT angiogram of the chest recently showed 4.6 cm thoracic aortic aneurysm.  Recent lower extremity venous Doppler obtained on 04/10/2019 was negative for DVT.  Repeat echocardiogram obtained on 04/24/2019, EF was greater than 65%, severe LVH, significant outflow obstruction at the mid cavity and  LVOT with peak gradient of at least 100 mmHg,  systolic anterior motion of mitral leaflet, mild dilatation of the ascending aorta measuring 43 mm.   We did proceed with cardiac cath on 06/26/19. This showed normal coronary anatomy. Normal LV and right heart pressures. LVOT gradient 20 mm Hg by cath. Hill was also done showing typical features of Chase Hill with predominant sigmoid septum and SAM. There was only mild late gadolinium enhancement. An Event monitor was placed with results noted below.  He was subsequently referred to Chase Hill and underwent a Septal myectomy on 01/14/20 by Chase Hill. On 01/16/20 he had a left sided DDD pacemaker (Chase Hill) placed for development of complete heart block. He did well and was DC on 01/20/20. He was readmitted 6/24-6/29/21 with acute pulmonary embolus. He had a 10 day history of increased SOB. Was noted on pacemaker check to have paroxysmal AFib. Was started on Eliquis but symptoms progressed and he presented to the ED.EKG showed atrial flutter. Chest x-ray showed minimal fibrosis/atelectasis in left lung base, otherwise clear. Chest CT showed acute multilobe for segmental and subsegmental thrombo emboli affecting all lobes. Chest CT and bedside echocardiogram did not reveal evidence of right heart strain. The patient received Chase Hill fluids, loading dose of heparin and was initiated on high-intensity heparin GTT. The following morning the patient did have a dip in his platelets and vascular Medicine had concern for possible Chase Hill, which was positive. The patient was transitioned to bivalirudin. Lupus anticoagulant was negative. Upon discharge the patient transition back to apixaban 10 mg b.i.d. x7 days and then 5 mg b.i.d. x3 months or indefinitely for atrial  fibrillation. LE doppler showed extensive DVT in left leg from the common femoral vein down.The patient's outpatient cardiologist, Chase. Gwendalyn Hill, was contacted regarding starting amiodarone while the patient was hospitalized. He recommended initiating amiodarone 200 mg  b.i.d. x7 days followed by 200 mg daily with Cardiology follow up.  Since then he has had persistent Afib. Was seen by Chase Hill who initially increased his amiodarone. He then developed side effects on the amiodarone with tremors, difficulty writing and typing,  and imbalance. He had DCCV on 04/12/20 and amiodarone was discontinued. Last pacer check in November showed Afib burden < 0.1%. He was seen by EP in June and doing well. Venous dopplers done at that time due to swelling and were unchanged from before with left femoral and popliteal chronic DVT.   Echo done 03/15/21 results as noted below.   On follow up today he states that he is doing well. Active playing golf and working.   His breathing is good. No chest pain or palpitations. His energy level is good. No pain.. he feels it in his left leg at times but no increased swelling. Does wear compression hose.     Past Medical History:  Diagnosis Date   ALLERGIC RHINITIS 08/16/2009   Qualifier: Diagnosis of  By: Alwyn Ren MD, Chrissie Noa   Onset:as a child Character: perennial but increased seasonally Triggers: dust mites; minor as pollen Allergy testing: yes X 2; as child & in 1995, Chase Jethro Bolus. Never took shots Maintenance medications/ response:Loratidine daily Smoking history:never Family history pulmonary disease: no      Cervical radiculopathy at C7    PMH of   Chest pain 11/22/2012   Evaluated by Chase Marca Ancona, Cardiology 2014 ECHO, MR : ? hypertrophic cardiomyopathy Rx: Beta blocker    Diverticulosis    colonoscopy 10-15-2006   DIVERTICULOSIS, COLON 08/16/2009   Qualifier: Diagnosis of  By: Alwyn Ren MD, Chrissie Noa   Chase Jarold Motto, GI     DVT (deep venous thrombosis) (HCC)    ELECTROCARDIOGRAM, ABNORMAL 08/16/2009   Qualifier: Diagnosis of  By: Alwyn Ren MD, William   Right bundle branch block, left posterior fascicular block( bifascicular block), not present 01/17/2007; noted 08/16/2009     GERD (gastroesophageal reflux disease)    Sullivan Lone syndrome     GILBERT'S SYNDROME 01/14/2007   Qualifier: Diagnosis of  By: Alwyn Ren MD, William     Hemorrhoids    Hyperlipidemia    Hypothyroidism    Obstructive hypertrophic cardiomyopathy (HCC) 01/15/2013   Perennial allergic rhinitis with seasonal variation    Pulmonary embolism (HCC)    REFLUX, ESOPHAGEAL 01/14/2007   Qualifier: Diagnosis of  By: Alwyn Ren MD, William     Thoracic aortic aneurysm without rupture (HCC) 03/20/2017   THYROID FUNCTION TEST, ABNORMAL 09/07/2010   Qualifier: Diagnosis of  By: Alwyn Ren MD, Chrissie Noa      Varicose veins of right lower extremity with complications 02/04/2018   Past Surgical History:  Procedure Laterality Date   CARDIOVERSION N/A 04/12/2020   Procedure: CARDIOVERSION;  Surgeon: Jodelle Red, MD;  Location: [facility] ENDOSCOPY;  Service: Cardiovascular;  Laterality: N/A;   COLONOSCOPY  2010   Tics   ENDOVENOUS ABLATION SAPHENOUS VEIN W/ LASER Right 08/21/2018   endovenous laser ablation right greater saphenous vein and stab phlebectomy > 20 incisions right leg by Fabienne Bruns MD    RIGHT/LEFT HEART CATH AND CORONARY ANGIOGRAPHY N/A 06/26/2019   Procedure: RIGHT/LEFT HEART CATH AND CORONARY ANGIOGRAPHY;  Surgeon: Hill, Jaria Conway M, MD;  Location: [facility] INVASIVE CV LAB;  Service: Cardiovascular;  Laterality: N/A;   WISDOM TOOTH EXTRACTION       Current Meds  Medication Sig   ELIQUIS 5 MG TABS tablet Take 5 mg by mouth 2 (two) times daily.   levothyroxine (SYNTHROID) 150 MCG tablet Take 150 mcg by mouth daily.   loratadine (CLARITIN) 10 MG tablet Take 10 mg by mouth daily after breakfast.    Melatonin 5 MG CAPS Take 5 mg by mouth at bedtime as needed (sleep).   Multiple Vitamin (MULTIVITAMIN WITH MINERALS) TABS tablet Take 1 tablet by mouth every evening.   rosuvastatin (CRESTOR) 5 MG tablet Take 5 mg by mouth every Monday, Wednesday, and Friday at 8 PM. BEDTIME.     Allergies:   Heparin   Social History   Tobacco Use   Smoking status: Never   Smokeless tobacco:  Never  Vaping Use   Vaping Use: Never used  Substance Use Topics   Alcohol use: Yes    Alcohol/week: 2.0 standard drinks    Types: 2 Glasses of wine per week    Comment:  < 3 /week , only socially   Drug use: No     Family Hx: The patient's family history includes Breast cancer in his mother; Congestive Heart Failure in his father; Coronary artery disease in his father and paternal aunt; Stroke (age of onset: 39) in his maternal grandmother. There is no history of Diabetes, Hypertension, or Colon cancer.  ROS:   Please see the history of present illness.    All other systems reviewed and are negative.   Prior CV studies:   The following studies were reviewed today:  CTA of chest 02/19/2019 IMPRESSION: 1. No change in caliber of the tubular ascending thoracic aorta, measuring 4.6 x 4.3 cm, not significantly changed in caliber, measuring 4.5 x 4.2 cm on examinations dating back to 03/23/2017. The sinuses of Valsalva measure up to 4.4 cm in caliber. Normal caliber of the aortic valve. The descending thoracic aorta is normal in caliber. No significant atherosclerosis.   2. Examination is not tailored for the evaluation of pulmonary embolism, however there is no obvious residual pulmonary embolism in the right lower lobe at the site of embolism seen on prior examination dated 09/11/2018.   3.  Stable, benign small pulmonary nodules.     Venous doppler 04/10/2019 Summary: Right: Successful vein closure. There is no evidence of deep vein thrombosis in the lower extremity. No cystic structure found in the popliteal fossa. No significant change compared to previous study. Left: No evidence of common femoral vein obstruction.   *See table(s) above for measurements and observations.     Echo 04/24/2019 IMPRESSIONS  1. The left ventricle has hyperdynamic systolic function, with an ejection fraction of >65%. The cavity size was normal. There is severely increased left ventricular wall  thickness. Left ventricular diastolic Doppler parameters are consistent with  impaired relaxation.  2. LVOT is narrowed The LV is severely hypertrophied. There is significant outflow obstruction at mid cavity and through LVOT with peak gradient at least 100 mm Hg. All consistent with Chase Hill.  3. The right ventricle has normal systolic function. The cavity was normal. There is no increase in right ventricular wall thickness.  4. Left atrial size was mildly dilated.  5. There is systolic anterior motion of mitral leaflets.  6. The aortic valve is tricuspid. Mild thickening of the aortic valve. Aortic valve regurgitation is mild by color flow Doppler.  7. The aorta is abnormal unless otherwise noted.  8. The ascending aorta is normal in size and structure.  9. There is mild dilatation of the ascending aorta measuring 43 mm.    Cardiac cath 06/26/19:  RIGHT/LEFT HEART CATH AND CORONARY ANGIOGRAPHY  Conclusion    The left ventricular systolic function is normal. LV end diastolic pressure is normal. The left ventricular ejection fraction is 55-65% by visual estimate.   1. Normal coronary anatomy 2. Normal LV function' 3. 20 mm Hg LVOT gradient at rest. Typical Brockenbrough sign post PVC. 4. Normal/low LV filling pressures 5. Normal right  Heart pressures 6. Normal cardiac output 7. Moderate MR but post PVC   Plan: recommend further evaluation with cardiac Hill and event monitor.      CLINICAL DATA:  Hypertrophic cardiomyopathy suspected, further testing   COMPARISON: No images available from prior cardiac Hill for comparison   EXAM: CARDIAC Hill   TECHNIQUE: The patient was scanned on a 1.5 Tesla GE magnet. A dedicated cardiac coil was used. Functional imaging was done using Fiesta sequences. 2,3, and 4 chamber views were done to assess for RWMA's. Modified Simpson's rule using a short axis stack was used to calculate an ejection fraction on a dedicated work Chief Technology Officer. The patient received 27mL GADAVIST GADOBUTROL 1 MMOL/ML Chase Hill SOLN. After 10 minutes inversion recovery sequences were used to assess for infiltration and scar tissue.   CONTRAST:  57mL GADAVIST GADOBUTROL 1 MMOL/ML Chase Hill SOLN   FINDINGS: LEFT VENTRICLE:   Normal left ventricular size. Hyperdynamic systolic function (LVEF = 69%).   There are no regional wall motion abnormalities.   Late gadolinium enhancement in the left ventricular myocardium is seen in the inferior RV insertion point at the mid ventricle and apex. This finding is seen in an area of increased myocardial wall thickness. Differential diagnosis includes fibrosis secondary to hypertrophic cardiomyopathy vs increased pulmonary pressures.   Normal T1 myocardial nulling kinetics suggest against a diagnosis of cardiac amyloidosis.   Maximal wall thickness: 18 mm   Location: Basal septum.   There is systolic anterior motion of the mitral valve. Flow dephasing suggests left ventricular outflow tract obstruction.   Mitral regurgitation is present and posteriorly directed.   No evidence of left ventricular apical aneurysm.   Findings are consistent with hypertrophic cardiomyopathy with obstruction. Morphologic subtype: Sigmoid subtype.   RIGHT VENTRICLE:   Normal right ventricular size, thickness and systolic function (RVEF = 63%).   There are no regional wall motion abnormalities.   No delayed myocardial enhancement in the right ventricle.   ATRIA:   Mildly dilated left atrial chamber size.  normal right atrial size.   VALVES:   Systolic anterior motion of the mitral valve with posteriorly directed mitral regurgitation, qualitatively mild.   Qualitatively trivial aortic valve regurgitation by flow dephasing.   PERICARDIUM:   Normal pericardium.  No pericardial effusion.   AORTA:   Mid ascending aorta measures 43 mm in a sagittal transverse plane. Orthogonal measurements unable to be performed  for true axial measurement.   OTHER:   MEASUREMENTS:   LVEDV: 145 ml   LVESV: 45 ml   SV: 100 ml   CO: 6 L/min   Myocardial mass: 184 g   RVEDV: 100 ml   RVEDS: 38 ml   RVSV: 63 mL   IMPRESSION: 1.  Hyperdynamic left ventricular systolic function.  LVEF 69%.   2.  Normal right ventricular chamber size and function.  RVEF 63%.   3. Findings consistent with hypertrophic cardiomyopathy, sigmoid  subtype, maximal wall thickness 18 mm at the basal left ventricular septum.   4. Left ventricular outflow tract obstruction. Flow dephasing seen in the LVOT with systolic anterior motion of the mitral valve, and posteriorly directed mitral valve regurgitation.   5. Delayed myocardial enhancement is seen in an area of increased left ventricular wall thickness at the mid ventricle, at the inferior RV insertion point. This finding could represent fibrosis in the setting hypertrophic cardiomyopathy vs. increased pulmonary pressures.   6.  Mid ascending aorta measures 43 mm in a transverse plane.     Electronically Signed   By: Weston BrassGayatri  Acharya  Event monitor 08/19/19.Study Highlights  Normal sinus rhythm with first degree AV block Episodes of second degree AV block Mobitz type 1 PVCs. One run of AIVR PACs with short bursts of PACs- longest 6 beats. No Afib noted- episodes noted on monitor appear to be artifact.   Echo 01/19/20:Echocardiogram 01/19/2020 Final Impressions 1. Hypertrophic cardiomyopathy status post left ventricular septal myectomy (14-Jan-2020). 2. Systolic anterior motion of mitral apparatus with trivial mitral valve regurgitation. No dynamic left ventricular outflow tract obstruction at rest. 3. Mild aortic valve regurgitation (central jet). ERO 0.07 cm2, regurgitant volume 19 mL. 4. Normal left ventricular chamber size, calculated 2-D linear ejection fraction 70 %. 5. Normal right ventricular chamber size, mildly reduced systolic function, estimated right  ventricular systolic pressure 22 mmHg (systolic blood pressure 129 mmHg). 6. Mildly enlarged mid ascending aorta diameter (diameter 45 mm at mid level). Upper limits of normal for age, sex, and BSA = 43 mm. 7. No pericardial effusion.    Echo 01/19/20: Final Impressions  1. Hypertrophic cardiomyopathy status post left ventricular septal myectomy (14-Jan-2020).  2. Systolic anterior motion of mitral apparatus with trivial mitral valve regurgitation. No  dynamic left ventricular outflow tract obstruction at rest.  3. Mild aortic valve regurgitation (central jet). ERO 0.07 cm^2, regurgitant volume 19 mL.  4. Normal left ventricular chamber size, calculated 2-D linear ejection fraction 70 %.  5. Normal right ventricular chamber size, mildly reduced systolic function, estimated right  ventricular systolic pressure 22 mmHg (systolic blood pressure 129 mmHg).  6. Mildly enlarged mid ascending aorta diameter (diameter 45 mm at mid level).  Upper limits of  normal for age, sex, and BSA = 43 mm.  7. No pericardial effusion.  Echo : 03/15/21: IMPRESSIONS     1. Left ventricular ejection fraction, by estimation, is 55 to 60%. The  left ventricle has normal function. The left ventricle has no regional  wall motion abnormalities but there is septal-lateral dyssynchrony  consistent with RV pacing. There is mild  left ventricular hypertrophy. Left ventricular diastolic parameters are  consistent with Grade I diastolic dysfunction (impaired relaxation). There  is no significant LV outflow gradient noted and no mitral valve SAM noted.   2. Right ventricular systolic function is normal. The right ventricular  size is normal. Tricuspid regurgitation signal is inadequate for assessing  PA pressure.   3. The mitral valve is normal in structure. Trivial mitral valve  regurgitation. No evidence of mitral stenosis.   4. The aortic valve is tricuspid. Aortic valve regurgitation is mild to  moderate. Mild aortic  valve sclerosis is present, with no evidence of  aortic valve stenosis.   5. Aortic dilatation noted. There is moderate dilatation of the ascending  aorta, measuring 46 mm.   6. The inferior vena cava is normal in size with greater than 50%  respiratory variability, suggesting right atrial pressure of  3 mmHg.   Comparison(s): 04/24/19 EF >65%.    Labs/Other Tests and Data Reviewed:    EKG:  No ECG reviewed.  Recent Labs: No results found for requested labs within last 8760 hours.   Recent Lipid Panel Lab Results  Component Value Date/Time   CHOL 182 06/23/2019 09:19 AM   CHOL 202 (H) 04/29/2015 07:34 AM   TRIG 180 (H) 06/23/2019 09:19 AM   TRIG 165 (H) 04/29/2015 07:34 AM   HDL 51 06/23/2019 09:19 AM   HDL 47 04/29/2015 07:34 AM   CHOLHDL 3.6 06/23/2019 09:19 AM   CHOLHDL 3 08/24/2016 07:27 AM   LDLCALC 100 (H) 06/23/2019 09:19 AM   LDLCALC 122 (H) 04/29/2015 07:34 AM   LDLDIRECT 78.7 03/28/2013 07:32 AM   Dated 08/12/19: creatinine 1.2. otherwise CMET normal. CBC and TSH normal. Cholesterol 177, triglycerides 209, HDL 43, LDL 92.  Dated 02/19/20: Hgb 12.9, creatinine 1.3. TSH 5.22. ALT 25. Dated 08/19/20: cholesterol 161, triglycerides 186, HDL 43, LDL 81. CMET normal.    Wt Readings from Last 3 Encounters:  04/12/21 209 lb (94.8 kg)  01/28/21 208 lb (94.3 kg)  08/18/20 210 lb (95.3 kg)     Objective:    Vital Signs:  BP 118/72   Pulse 87   Resp 20   Ht  (1.803 m)   Wt 209 lb (94.8 kg)   SpO2 99%   BMI 29.15 kg/m    GENERAL:  Well appearing WM in NAD HEENT:  PERRL, EOMI, sclera are clear. Oropharynx is clear. NECK:  No jugular venous distention, carotid upstroke brisk and symmetric, no bruits, no thyromegaly or adenopathy LUNGS:  Clear to auscultation bilaterally CHEST:  Unremarkable HEART:  RRR,  PMI not displaced or sustained,S1 and S2 within normal limits, no S3, no S4: no clicks, no rubs, no murmurs.  ABD:  Soft, nontender. BS +, no masses or bruits.  No hepatomegaly, no splenomegaly EXT:  2 + pulses throughout, trace LLE edema, no cyanosis no clubbing no redness SKIN:  Warm and dry.  No rashes NEURO:  Alert and oriented x 3. Cranial nerves II through XII intact. PSYCH:  Cognitively intact   ASSESSMENT & PLAN:    Hypertrophic cardiomyopathy:  with marked increase in LVOT gradient 100 mm Hg last year. Symptom of dyspnea on exertion and dizziness with exertion.  Intolerant of beta blocker in the past due to dizziness. Now s/p septal Myectomy at [facility] in June 2021. S/p DDDR pacemaker for CHB which was anticipated. Course complicated by development of large Left leg DVT and pulmonary embolus. This was associated with  AFib/flutter.On Eliquis.  Echo post op at [facility] on 01/19/20 showed no outflow gradient and trivial MR. Normal EF. Recent Echo showed no LV outflow gradient. Mild LVH. Mild to mod AI. Clinically doing very well.   Thoracic aortic aneurysm: Mildly dilated aorta on recent echocardiogram measuring up to 43 mm. Follow up yearly with Echo   Hyperlipidemia: Continue Crestor.   Hypothyroidism: On Synthroid. TSH OK    History of PE past- more recent DVT/ PE post op heart surgery. Repeat venous doppler showed persistent common femoral and popliteal DVT but improved signal distally.Will continue Eliquis chronically and support hose.   Second degree AV block Mobitz type 1 progressing to CHB post septal myectomy. S/p DDDR pacemaker with Chase generator. Pacer check on Aug 10 was OK.   7. Paroxysmal Afib/flutter. Post op heart surgery and in setting of acute PE. Was in persistent Afib/flutter in  follow up. S/p successful DCCV on 04/12/20. Maintaining NSR so far. Amiodarone discontinued due to side effects. If Afib recurs will need to consider alternative therapies.   8.   History of Chase Hill.    Medication Adjustments/Labs and Tests Ordered: Current medicines are reviewed at length with the patient today.  Concerns regarding medicines are  outlined above.   Tests Ordered: No orders of the defined types were placed in this encounter.   Medication Changes: No orders of the defined types were placed in this encounter.   Follow Up: 6 months   Signed, Claude Waldman [place], MD  04/12/2021 8:58 AM    [facility]

## 2021-04-12 ENCOUNTER — Encounter: Payer: Self-pay | Admitting: Cardiology

## 2021-04-12 ENCOUNTER — Other Ambulatory Visit: Payer: Self-pay

## 2021-04-12 ENCOUNTER — Ambulatory Visit (INDEPENDENT_AMBULATORY_CARE_PROVIDER_SITE_OTHER): Payer: BC Managed Care – PPO | Admitting: Cardiology

## 2021-04-12 VITALS — BP 118/72 | HR 87 | Resp 20 | Ht 71.0 in | Wt 209.0 lb

## 2021-04-12 DIAGNOSIS — I442 Atrioventricular block, complete: Secondary | ICD-10-CM

## 2021-04-12 DIAGNOSIS — I480 Paroxysmal atrial fibrillation: Secondary | ICD-10-CM | POA: Diagnosis not present

## 2021-04-12 DIAGNOSIS — I422 Other hypertrophic cardiomyopathy: Secondary | ICD-10-CM | POA: Diagnosis not present

## 2021-04-12 DIAGNOSIS — Z950 Presence of cardiac pacemaker: Secondary | ICD-10-CM | POA: Diagnosis not present

## 2021-04-12 DIAGNOSIS — Z86711 Personal history of pulmonary embolism: Secondary | ICD-10-CM

## 2021-04-14 NOTE — Progress Notes (Signed)
Remote pacemaker transmission.   

## 2021-04-20 DIAGNOSIS — Z85828 Personal history of other malignant neoplasm of skin: Secondary | ICD-10-CM | POA: Diagnosis not present

## 2021-04-20 DIAGNOSIS — H43813 Vitreous degeneration, bilateral: Secondary | ICD-10-CM | POA: Diagnosis not present

## 2021-04-20 DIAGNOSIS — L817 Pigmented purpuric dermatosis: Secondary | ICD-10-CM | POA: Diagnosis not present

## 2021-04-20 DIAGNOSIS — L570 Actinic keratosis: Secondary | ICD-10-CM | POA: Diagnosis not present

## 2021-04-20 DIAGNOSIS — D2261 Melanocytic nevi of right upper limb, including shoulder: Secondary | ICD-10-CM | POA: Diagnosis not present

## 2021-04-20 DIAGNOSIS — D225 Melanocytic nevi of trunk: Secondary | ICD-10-CM | POA: Diagnosis not present

## 2021-05-13 DIAGNOSIS — Z23 Encounter for immunization: Secondary | ICD-10-CM | POA: Diagnosis not present

## 2021-06-22 ENCOUNTER — Ambulatory Visit (INDEPENDENT_AMBULATORY_CARE_PROVIDER_SITE_OTHER): Payer: BC Managed Care – PPO

## 2021-06-22 DIAGNOSIS — I442 Atrioventricular block, complete: Secondary | ICD-10-CM | POA: Diagnosis not present

## 2021-06-22 LAB — CUP PACEART REMOTE DEVICE CHECK
Battery Remaining Longevity: 132 mo
Battery Voltage: 3.03 V
Brady Statistic AP VP Percent: 39.8 %
Brady Statistic AP VS Percent: 0.14 %
Brady Statistic AS VP Percent: 57.92 %
Brady Statistic AS VS Percent: 2.13 %
Brady Statistic RA Percent Paced: 40.48 %
Brady Statistic RV Percent Paced: 97.73 %
Date Time Interrogation Session: 20221109010731
Implantable Lead Implant Date: 20210604
Implantable Lead Implant Date: 20210604
Implantable Lead Location: 753859
Implantable Lead Location: 753860
Implantable Lead Model: 4076
Implantable Lead Model: 4076
Implantable Pulse Generator Implant Date: 20210604
Lead Channel Impedance Value: 266 Ohm
Lead Channel Impedance Value: 437 Ohm
Lead Channel Impedance Value: 456 Ohm
Lead Channel Impedance Value: 513 Ohm
Lead Channel Pacing Threshold Amplitude: 0.5 V
Lead Channel Pacing Threshold Amplitude: 0.875 V
Lead Channel Pacing Threshold Pulse Width: 0.4 ms
Lead Channel Pacing Threshold Pulse Width: 0.4 ms
Lead Channel Sensing Intrinsic Amplitude: 12.25 mV
Lead Channel Sensing Intrinsic Amplitude: 12.25 mV
Lead Channel Sensing Intrinsic Amplitude: 5.625 mV
Lead Channel Sensing Intrinsic Amplitude: 5.625 mV
Lead Channel Setting Pacing Amplitude: 2 V
Lead Channel Setting Pacing Amplitude: 2 V
Lead Channel Setting Pacing Pulse Width: 0.4 ms
Lead Channel Setting Sensing Sensitivity: 0.9 mV

## 2021-06-30 NOTE — Progress Notes (Signed)
Remote pacemaker transmission.   

## 2021-09-14 DIAGNOSIS — R972 Elevated prostate specific antigen [PSA]: Secondary | ICD-10-CM | POA: Diagnosis not present

## 2021-09-21 ENCOUNTER — Ambulatory Visit (INDEPENDENT_AMBULATORY_CARE_PROVIDER_SITE_OTHER): Payer: BC Managed Care – PPO

## 2021-09-21 DIAGNOSIS — I442 Atrioventricular block, complete: Secondary | ICD-10-CM

## 2021-09-21 DIAGNOSIS — R972 Elevated prostate specific antigen [PSA]: Secondary | ICD-10-CM | POA: Diagnosis not present

## 2021-09-21 DIAGNOSIS — N400 Enlarged prostate without lower urinary tract symptoms: Secondary | ICD-10-CM | POA: Diagnosis not present

## 2021-09-21 LAB — CUP PACEART REMOTE DEVICE CHECK
Battery Remaining Longevity: 130 mo
Battery Voltage: 3.03 V
Brady Statistic AP VP Percent: 42.81 %
Brady Statistic AP VS Percent: 0.11 %
Brady Statistic AS VP Percent: 55.53 %
Brady Statistic AS VS Percent: 1.55 %
Brady Statistic RA Percent Paced: 43.16 %
Brady Statistic RV Percent Paced: 98.34 %
Date Time Interrogation Session: 20230207222102
Implantable Lead Implant Date: 20210604
Implantable Lead Implant Date: 20210604
Implantable Lead Location: 753859
Implantable Lead Location: 753860
Implantable Lead Model: 4076
Implantable Lead Model: 4076
Implantable Pulse Generator Implant Date: 20210604
Lead Channel Impedance Value: 266 Ohm
Lead Channel Impedance Value: 399 Ohm
Lead Channel Impedance Value: 418 Ohm
Lead Channel Impedance Value: 494 Ohm
Lead Channel Pacing Threshold Amplitude: 0.5 V
Lead Channel Pacing Threshold Amplitude: 0.625 V
Lead Channel Pacing Threshold Pulse Width: 0.4 ms
Lead Channel Pacing Threshold Pulse Width: 0.4 ms
Lead Channel Sensing Intrinsic Amplitude: 12.25 mV
Lead Channel Sensing Intrinsic Amplitude: 12.25 mV
Lead Channel Sensing Intrinsic Amplitude: 6.625 mV
Lead Channel Sensing Intrinsic Amplitude: 6.625 mV
Lead Channel Setting Pacing Amplitude: 1.5 V
Lead Channel Setting Pacing Amplitude: 2 V
Lead Channel Setting Pacing Pulse Width: 0.4 ms
Lead Channel Setting Sensing Sensitivity: 0.9 mV

## 2021-09-26 NOTE — Progress Notes (Signed)
Remote pacemaker transmission.   

## 2021-10-12 DIAGNOSIS — Z0000 Encounter for general adult medical examination without abnormal findings: Secondary | ICD-10-CM | POA: Diagnosis not present

## 2021-10-12 DIAGNOSIS — E785 Hyperlipidemia, unspecified: Secondary | ICD-10-CM | POA: Diagnosis not present

## 2021-10-12 DIAGNOSIS — E039 Hypothyroidism, unspecified: Secondary | ICD-10-CM | POA: Diagnosis not present

## 2021-10-17 DIAGNOSIS — Z0000 Encounter for general adult medical examination without abnormal findings: Secondary | ICD-10-CM | POA: Diagnosis not present

## 2021-10-17 DIAGNOSIS — Z1339 Encounter for screening examination for other mental health and behavioral disorders: Secondary | ICD-10-CM | POA: Diagnosis not present

## 2021-10-17 DIAGNOSIS — Z1331 Encounter for screening for depression: Secondary | ICD-10-CM | POA: Diagnosis not present

## 2021-10-17 DIAGNOSIS — E039 Hypothyroidism, unspecified: Secondary | ICD-10-CM | POA: Diagnosis not present

## 2021-12-21 ENCOUNTER — Ambulatory Visit (INDEPENDENT_AMBULATORY_CARE_PROVIDER_SITE_OTHER): Payer: BC Managed Care – PPO

## 2021-12-21 DIAGNOSIS — I442 Atrioventricular block, complete: Secondary | ICD-10-CM | POA: Diagnosis not present

## 2021-12-22 LAB — CUP PACEART REMOTE DEVICE CHECK
Battery Remaining Longevity: 125 mo
Battery Voltage: 3.02 V
Brady Statistic AP VP Percent: 37.1 %
Brady Statistic AP VS Percent: 0.1 %
Brady Statistic AS VP Percent: 61.06 %
Brady Statistic AS VS Percent: 1.74 %
Brady Statistic RA Percent Paced: 37.51 %
Brady Statistic RV Percent Paced: 98.16 %
Date Time Interrogation Session: 20230509211534
Implantable Lead Implant Date: 20210604
Implantable Lead Implant Date: 20210604
Implantable Lead Location: 753859
Implantable Lead Location: 753860
Implantable Lead Model: 4076
Implantable Lead Model: 4076
Implantable Pulse Generator Implant Date: 20210604
Lead Channel Impedance Value: 266 Ohm
Lead Channel Impedance Value: 323 Ohm
Lead Channel Impedance Value: 399 Ohm
Lead Channel Impedance Value: 475 Ohm
Lead Channel Pacing Threshold Amplitude: 0.625 V
Lead Channel Pacing Threshold Amplitude: 0.625 V
Lead Channel Pacing Threshold Pulse Width: 0.4 ms
Lead Channel Pacing Threshold Pulse Width: 0.4 ms
Lead Channel Sensing Intrinsic Amplitude: 12.25 mV
Lead Channel Sensing Intrinsic Amplitude: 12.25 mV
Lead Channel Sensing Intrinsic Amplitude: 5.5 mV
Lead Channel Sensing Intrinsic Amplitude: 5.5 mV
Lead Channel Setting Pacing Amplitude: 1.5 V
Lead Channel Setting Pacing Amplitude: 2 V
Lead Channel Setting Pacing Pulse Width: 0.4 ms
Lead Channel Setting Sensing Sensitivity: 0.9 mV

## 2022-01-02 NOTE — Progress Notes (Signed)
Remote pacemaker transmission.   

## 2022-01-20 NOTE — Progress Notes (Unsigned)
Date:  01/20/2022   ID:  Chase Hill, DOB 05/10/1946, MRN [id number]  PCP:  Garlan Fillers, MD  Cardiologist:  Tahni Porchia [place], MD  Electrophysiologist:  Lanier Prude, MD   Evaluation Performed:  Follow-Up Visit   Chief Complaint:  HOCM  History of Present Illness:    Chase Hill is a 76 y.o. male with PMH of hypertrophic cardiomyopathy, history of DVT/PE, TAA, hyperlipidemia, RBBB, Gilbert's syndrome, hypothyroidism.   Stress Myoview in 2011 showed inferior and apical ischemia.  Subsequent cardiac cath showed no obstructive disease.  In 2014, echocardiogram demonstrated basal septal hypertrophy with mild LV outflow tract gradient and chordal versus valvular SAM, moderate diastolic dysfunction.  Cardiac MRI obtained in June 2014 showed mild focal basal septal hypertrophy with systolic anterior motion of the mitral valve and mild MR.  There was no significant delayed enhancement.  He has been intolerant of pravastatin due to myalgia.  He also developed  fatigue and orthostasis on beta-blocker.   Due to increased septal hypertrophy and outflow gradient on previous echo in July 2018, he was started on Cardizem.  This was later discontinued due to concern of conduction disease with first-degree AV block, left anterior fascicular block and a right bundle branch block. He later underwent laser ablation of varicose veins by Dr. Darrick Penna. He was diagnosed with PE on CT angiogram of the chest in January 2019 and was placed on Xarelto.  PE was felt to be provoked by laser vein treatment.  He completed 6 months of therapy with Xarelto and switch to aspirin.  CT angiogram of the chest recently showed 4.6 cm thoracic aortic aneurysm.  Recent lower extremity venous Doppler obtained on 04/10/2019 was negative for DVT.  Repeat echocardiogram obtained on 04/24/2019, EF was greater than 65%, severe LVH, significant outflow obstruction at the mid cavity and  LVOT with peak gradient of at least 100 mmHg,  systolic anterior motion of mitral leaflet, mild dilatation of the ascending aorta measuring 43 mm.   We did proceed with cardiac cath on 06/26/19. This showed normal coronary anatomy. Normal LV and right heart pressures. LVOT gradient 20 mm Hg by cath. MRI was also done showing typical features of HOCM with predominant sigmoid septum and SAM. There was only mild late gadolinium enhancement. An Event monitor was placed with results noted below.  He was subsequently referred to [facility] and underwent a Septal myectomy on 01/14/20 by Dr Maryanna Shape. On 01/16/20 he had a left sided DDD pacemaker (Medtronic XT AZT MRI) placed for development of complete heart block. He did well and was DC on 01/20/20. He was readmitted 6/24-6/29/21 with acute pulmonary embolus. He had a 10 day history of increased SOB. Was noted on pacemaker check to have paroxysmal AFib. Was started on Eliquis but symptoms progressed and he presented to the ED.EKG showed atrial flutter. Chest x-ray showed minimal fibrosis/atelectasis in left lung base, otherwise clear. Chest CT showed acute multilobe for segmental and subsegmental thrombo emboli affecting all lobes. Chest CT and bedside echocardiogram did not reveal evidence of right heart strain. The patient received IV fluids, loading dose of heparin and was initiated on high-intensity heparin GTT. The following morning the patient did have a dip in his platelets and vascular Medicine had concern for possible HIT, which was positive. The patient was transitioned to bivalirudin. Lupus anticoagulant was negative. Upon discharge the patient transition back to apixaban 10 mg b.i.d. x7 days and then 5 mg b.i.d. x3 months or indefinitely for  atrial fibrillation. LE doppler showed extensive DVT in left leg from the common femoral vein down.The patient's outpatient cardiologist, Dr. Gwendalyn Ege, was contacted regarding starting amiodarone while the patient was hospitalized. He recommended initiating amiodarone 200 mg  b.i.d. x7 days followed by 200 mg daily with Cardiology follow up.  Since then he has had persistent Afib. Was seen by Dr Lalla Brothers who initially increased his amiodarone. He then developed side effects on the amiodarone with tremors, difficulty writing and typing,  and imbalance. He had DCCV on 04/12/20 and amiodarone was discontinued. Last pacer check in November showed Afib burden < 0.1%. He was seen by EP in June and doing well. Venous dopplers done at that time due to swelling and were unchanged from before with left femoral and popliteal chronic DVT.   Echo done 03/15/21 results as noted below.   On follow up today he states that he is doing well. Active playing golf and working.   His breathing is good. No chest pain or palpitations. His energy level is good. No pain.. he feels it in his left leg at times but no increased swelling. Does wear compression hose.     Past Medical History:  Diagnosis Date   ALLERGIC RHINITIS 08/16/2009   Qualifier: Diagnosis of  By: Alwyn Ren MD, Chrissie Noa   Onset:as a child Character: perennial but increased seasonally Triggers: dust mites; minor as pollen Allergy testing: yes X 2; as child & in 1995, Dr Jethro Bolus. Never took shots Maintenance medications/ response:Loratidine daily Smoking history:never Family history pulmonary disease: no      Cervical radiculopathy at C7    PMH of   Chest pain 11/22/2012   Evaluated by Dr Marca Ancona, Cardiology 2014 ECHO, MR : ? hypertrophic cardiomyopathy Rx: Beta blocker    Diverticulosis    colonoscopy 10-15-2006   DIVERTICULOSIS, COLON 08/16/2009   Qualifier: Diagnosis of  By: Alwyn Ren MD, Chrissie Noa   Dr Jarold Motto, GI     DVT (deep venous thrombosis) (HCC)    ELECTROCARDIOGRAM, ABNORMAL 08/16/2009   Qualifier: Diagnosis of  By: Alwyn Ren MD, William   Right bundle branch block, left posterior fascicular block( bifascicular block), not present 01/17/2007; noted 08/16/2009     GERD (gastroesophageal reflux disease)    Sullivan Lone syndrome     GILBERT'S SYNDROME 01/14/2007   Qualifier: Diagnosis of  By: Alwyn Ren MD, William     Hemorrhoids    Hyperlipidemia    Hypothyroidism    Obstructive hypertrophic cardiomyopathy (HCC) 01/15/2013   Perennial allergic rhinitis with seasonal variation    Pulmonary embolism (HCC)    REFLUX, ESOPHAGEAL 01/14/2007   Qualifier: Diagnosis of  By: Alwyn Ren MD, William     Thoracic aortic aneurysm without rupture (HCC) 03/20/2017   THYROID FUNCTION TEST, ABNORMAL 09/07/2010   Qualifier: Diagnosis of  By: Alwyn Ren MD, Chrissie Noa      Varicose veins of right lower extremity with complications 02/04/2018   Past Surgical History:  Procedure Laterality Date   CARDIOVERSION N/A 04/12/2020   Procedure: CARDIOVERSION;  Surgeon: Jodelle Red, MD;  Location: [facility] ENDOSCOPY;  Service: Cardiovascular;  Laterality: N/A;   COLONOSCOPY  2010   Tics   ENDOVENOUS ABLATION SAPHENOUS VEIN W/ LASER Right 08/21/2018   endovenous laser ablation right greater saphenous vein and stab phlebectomy > 20 incisions right leg by Fabienne Bruns MD    RIGHT/LEFT HEART CATH AND CORONARY ANGIOGRAPHY N/A 06/26/2019   Procedure: RIGHT/LEFT HEART CATH AND CORONARY ANGIOGRAPHY;  Surgeon: [place], Chase Galluzzo M, MD;  Location: [facility] INVASIVE CV LAB;  Service: Cardiovascular;  Laterality: N/A;   WISDOM TOOTH EXTRACTION       No outpatient medications have been marked as taking for the 01/24/22 encounter (Appointment) with Chase Hill, Chase Stryker M, MD.     Allergies:   Heparin   Social History   Tobacco Use   Smoking status: Never   Smokeless tobacco: Never  Vaping Use   Vaping Use: Never used  Substance Use Topics   Alcohol use: Yes    Alcohol/week: 2.0 standard drinks of alcohol    Types: 2 Glasses of wine per week    Comment:  < 3 /week , only socially   Drug use: No     Family Hx: The patient's family history includes Breast cancer in his mother; Congestive Heart Failure in his father; Coronary artery disease in his father and paternal aunt;  Stroke (age of onset: 1889) in his maternal grandmother. There is no history of Diabetes, Hypertension, or Colon cancer.  ROS:   Please see the history of present illness.    All other systems reviewed and are negative.   Prior CV studies:   The following studies were reviewed today:  CTA of chest 02/19/2019 IMPRESSION: 1. No change in caliber of the tubular ascending thoracic aorta, measuring 4.6 x 4.3 cm, not significantly changed in caliber, measuring 4.5 x 4.2 cm on examinations dating back to 03/23/2017. The sinuses of Valsalva measure up to 4.4 cm in caliber. Normal caliber of the aortic valve. The descending thoracic aorta is normal in caliber. No significant atherosclerosis.   2. Examination is not tailored for the evaluation of pulmonary embolism, however there is no obvious residual pulmonary embolism in the right lower lobe at the site of embolism seen on prior examination dated 09/11/2018.   3.  Stable, benign small pulmonary nodules.     Venous doppler 04/10/2019 Summary: Right: Successful vein closure. There is no evidence of deep vein thrombosis in the lower extremity. No cystic structure found in the popliteal fossa. No significant change compared to previous study. Left: No evidence of common femoral vein obstruction.   *See table(s) above for measurements and observations.     Echo 04/24/2019 IMPRESSIONS  1. The left ventricle has hyperdynamic systolic function, with an ejection fraction of >65%. The cavity size was normal. There is severely increased left ventricular wall thickness. Left ventricular diastolic Doppler parameters are consistent with  impaired relaxation.  2. LVOT is narrowed The LV is severely hypertrophied. There is significant outflow obstruction at mid cavity and through LVOT with peak gradient at least 100 mm Hg. All consistent with HOCM.  3. The right ventricle has normal systolic function. The cavity was normal. There is no increase in right  ventricular wall thickness.  4. Left atrial size was mildly dilated.  5. There is systolic anterior motion of mitral leaflets.  6. The aortic valve is tricuspid. Mild thickening of the aortic valve. Aortic valve regurgitation is mild by color flow Doppler.  7. The aorta is abnormal unless otherwise noted.  8. The ascending aorta is normal in size and structure.  9. There is mild dilatation of the ascending aorta measuring 43 mm.    Cardiac cath 06/26/19:  RIGHT/LEFT HEART CATH AND CORONARY ANGIOGRAPHY  Conclusion    The left ventricular systolic function is normal. LV end diastolic pressure is normal. The left ventricular ejection fraction is 55-65% by visual estimate.   1. Normal coronary anatomy 2. Normal LV function' 3. 20 mm Hg LVOT gradient at rest.  Typical Brockenbrough sign post PVC. 4. Normal/low LV filling pressures 5. Normal right  Heart pressures 6. Normal cardiac output 7. Moderate MR but post PVC   Plan: recommend further evaluation with cardiac MRI and event monitor.      CLINICAL DATA:  Hypertrophic cardiomyopathy suspected, further testing   COMPARISON: No images available from prior cardiac MRI for comparison   EXAM: CARDIAC MRI   TECHNIQUE: The patient was scanned on a 1.5 Tesla GE magnet. A dedicated cardiac coil was used. Functional imaging was done using Fiesta sequences. 2,3, and 4 chamber views were done to assess for RWMA's. Modified Simpson's rule using a short axis stack was used to calculate an ejection fraction on a dedicated work [employer]. The patient received 10mL GADAVIST GADOBUTROL 1 MMOL/ML IV SOLN. After 10 minutes inversion recovery sequences were used to assess for infiltration and scar tissue.   CONTRAST:  10mL GADAVIST GADOBUTROL 1 MMOL/ML IV SOLN   FINDINGS: LEFT VENTRICLE:   Normal left ventricular size. Hyperdynamic systolic function (LVEF = 69%).   There are no regional wall motion abnormalities.    Late gadolinium enhancement in the left ventricular myocardium is seen in the inferior RV insertion point at the mid ventricle and apex. This finding is seen in an area of increased myocardial wall thickness. Differential diagnosis includes fibrosis secondary to hypertrophic cardiomyopathy vs increased pulmonary pressures.   Normal T1 myocardial nulling kinetics suggest against a diagnosis of cardiac amyloidosis.   Maximal wall thickness: 18 mm   Location: Basal septum.   There is systolic anterior motion of the mitral valve. Flow dephasing suggests left ventricular outflow tract obstruction.   Mitral regurgitation is present and posteriorly directed.   No evidence of left ventricular apical aneurysm.   Findings are consistent with hypertrophic cardiomyopathy with obstruction. Morphologic subtype: Sigmoid subtype.   RIGHT VENTRICLE:   Normal right ventricular size, thickness and systolic function (RVEF = 63%).   There are no regional wall motion abnormalities.   No delayed myocardial enhancement in the right ventricle.   ATRIA:   Mildly dilated left atrial chamber size.  normal right atrial size.   VALVES:   Systolic anterior motion of the mitral valve with posteriorly directed mitral regurgitation, qualitatively mild.   Qualitatively trivial aortic valve regurgitation by flow dephasing.   PERICARDIUM:   Normal pericardium.  No pericardial effusion.   AORTA:   Mid ascending aorta measures 43 mm in a sagittal transverse plane. Orthogonal measurements unable to be performed for true axial measurement.   OTHER:   MEASUREMENTS:   LVEDV: 145 ml   LVESV: 45 ml   SV: 100 ml   CO: 6 L/min   Myocardial mass: 184 g   RVEDV: 100 ml   RVEDS: 38 ml   RVSV: 63 mL   IMPRESSION: 1.  Hyperdynamic left ventricular systolic function.  LVEF 69%.   2.  Normal right ventricular chamber size and function.  RVEF 63%.   3. Findings consistent with hypertrophic  cardiomyopathy, sigmoid subtype, maximal wall thickness 18 mm at the basal left ventricular septum.   4. Left ventricular outflow tract obstruction. Flow dephasing seen in the LVOT with systolic anterior motion of the mitral valve, and posteriorly directed mitral valve regurgitation.   5. Delayed myocardial enhancement is seen in an area of increased left ventricular wall thickness at the mid ventricle, at the inferior RV insertion point. This finding could represent fibrosis in the setting hypertrophic cardiomyopathy vs. increased pulmonary pressures.  6.  Mid ascending aorta measures 43 mm in a transverse plane.     Electronically Signed   By: Weston Brass  Event monitor 08/19/19.Study Highlights  Normal sinus rhythm with first degree AV block Episodes of second degree AV block Mobitz type 1 PVCs. One run of AIVR PACs with short bursts of PACs- longest 6 beats. No Afib noted- episodes noted on monitor appear to be artifact.   Echo 01/19/20:Echocardiogram 01/19/2020 Final Impressions 1. Hypertrophic cardiomyopathy status post left ventricular septal myectomy (14-Jan-2020). 2. Systolic anterior motion of mitral apparatus with trivial mitral valve regurgitation. No dynamic left ventricular outflow tract obstruction at rest. 3. Mild aortic valve regurgitation (central jet). ERO 0.07 cm2, regurgitant volume 19 mL. 4. Normal left ventricular chamber size, calculated 2-D linear ejection fraction 70 %. 5. Normal right ventricular chamber size, mildly reduced systolic function, estimated right ventricular systolic pressure 22 mmHg (systolic blood pressure 129 mmHg). 6. Mildly enlarged mid ascending aorta diameter (diameter 45 mm at mid level). Upper limits of normal for age, sex, and BSA = 43 mm. 7. No pericardial effusion.    Echo 01/19/20: Final Impressions  1. Hypertrophic cardiomyopathy status post left ventricular septal myectomy (14-Jan-2020).  2. Systolic anterior motion of mitral  apparatus with trivial mitral valve regurgitation. No  dynamic left ventricular outflow tract obstruction at rest.  3. Mild aortic valve regurgitation (central jet). ERO 0.07 cm^2, regurgitant volume 19 mL.  4. Normal left ventricular chamber size, calculated 2-D linear ejection fraction 70 %.  5. Normal right ventricular chamber size, mildly reduced systolic function, estimated right  ventricular systolic pressure 22 mmHg (systolic blood pressure 129 mmHg).  6. Mildly enlarged mid ascending aorta diameter (diameter 45 mm at mid level).  Upper limits of  normal for age, sex, and BSA = 43 mm.  7. No pericardial effusion.  Echo : 03/15/21: IMPRESSIONS     1. Left ventricular ejection fraction, by estimation, is 55 to 60%. The  left ventricle has normal function. The left ventricle has no regional  wall motion abnormalities but there is septal-lateral dyssynchrony  consistent with RV pacing. There is mild  left ventricular hypertrophy. Left ventricular diastolic parameters are  consistent with Grade I diastolic dysfunction (impaired relaxation). There  is no significant LV outflow gradient noted and no mitral valve SAM noted.   2. Right ventricular systolic function is normal. The right ventricular  size is normal. Tricuspid regurgitation signal is inadequate for assessing  PA pressure.   3. The mitral valve is normal in structure. Trivial mitral valve  regurgitation. No evidence of mitral stenosis.   4. The aortic valve is tricuspid. Aortic valve regurgitation is mild to  moderate. Mild aortic valve sclerosis is present, with no evidence of  aortic valve stenosis.   5. Aortic dilatation noted. There is moderate dilatation of the ascending  aorta, measuring 46 mm.   6. The inferior vena cava is normal in size with greater than 50%  respiratory variability, suggesting right atrial pressure of 3 mmHg.   Comparison(s): 04/24/19 EF >65%.    Labs/Other Tests and Data Reviewed:    EKG:  No  ECG reviewed.  Recent Labs: No results found for requested labs within last 365 days.   Recent Lipid Panel Lab Results  Component Value Date/Time   CHOL 182 06/23/2019 09:19 AM   CHOL 202 (H) 04/29/2015 07:34 AM   TRIG 180 (H) 06/23/2019 09:19 AM   TRIG 165 (H) 04/29/2015 07:34 AM   HDL 51  06/23/2019 09:19 AM   HDL 47 04/29/2015 07:34 AM   CHOLHDL 3.6 06/23/2019 09:19 AM   CHOLHDL 3 08/24/2016 07:27 AM   LDLCALC 100 (H) 06/23/2019 09:19 AM   LDLCALC 122 (H) 04/29/2015 07:34 AM   LDLDIRECT 78.7 03/28/2013 07:32 AM   Dated 08/12/19: creatinine 1.2. otherwise CMET normal. CBC and TSH normal. Cholesterol 177, triglycerides 209, HDL 43, LDL 92.  Dated 02/19/20: Hgb 12.9, creatinine 1.3. TSH 5.22. ALT 25. Dated 08/19/20: cholesterol 161, triglycerides 186, HDL 43, LDL 81. CMET normal.  Dated 10/12/21: cholesterol 153, triglycerides 105, HDL 45, LDL 89. Creatinine 1.36. otherwise CMET, CBC, and TFTs normal.   Wt Readings from Last 3 Encounters:  04/12/21 209 lb (94.8 kg)  01/28/21 208 lb (94.3 kg)  08/18/20 210 lb (95.3 kg)     Objective:    Vital Signs:  There were no vitals taken for this visit.   GENERAL:  Well appearing WM in NAD HEENT:  PERRL, EOMI, sclera are clear. Oropharynx is clear. NECK:  No jugular venous distention, carotid upstroke brisk and symmetric, no bruits, no thyromegaly or adenopathy LUNGS:  Clear to auscultation bilaterally CHEST:  Unremarkable HEART:  RRR,  PMI not displaced or sustained,S1 and S2 within normal limits, no S3, no S4: no clicks, no rubs, no murmurs.  ABD:  Soft, nontender. BS +, no masses or bruits. No hepatomegaly, no splenomegaly EXT:  2 + pulses throughout, trace LLE edema, no cyanosis no clubbing no redness SKIN:  Warm and dry.  No rashes NEURO:  Alert and oriented x 3. Cranial nerves II through XII intact. PSYCH:  Cognitively intact   ASSESSMENT & PLAN:    Hypertrophic cardiomyopathy:  with marked increase in LVOT gradient 100 mm Hg  last year. Symptom of dyspnea on exertion and dizziness with exertion.  Intolerant of beta blocker in the past due to dizziness. Now s/p septal Myectomy at [facility] in June 2021. S/p DDDR pacemaker for CHB which was anticipated. Course complicated by development of large Left leg DVT and pulmonary embolus. This was associated with  AFib/flutter.On Eliquis.  Echo post op at [facility] on 01/19/20 showed no outflow gradient and trivial MR. Normal EF. Recent Echo showed no LV outflow gradient. Mild LVH. Mild to mod AI. Clinically doing very well.   Thoracic aortic aneurysm: Mildly dilated aorta on recent echocardiogram measuring up to 43 mm. Follow up yearly with Echo   Hyperlipidemia: Continue Crestor.   Hypothyroidism: On Synthroid. TSH OK    History of PE past- more recent DVT/ PE post op heart surgery. Repeat venous doppler showed persistent common femoral and popliteal DVT but improved signal distally.Will continue Eliquis chronically and support hose.   Second degree AV block Mobitz type 1 progressing to CHB post septal myectomy. S/p DDDR pacemaker with Medtronic generator. Pacer check on Aug 10 was OK.   7. Paroxysmal Afib/flutter. Post op heart surgery and in setting of acute PE. Was in persistent Afib/flutter in follow up. S/p successful DCCV on 04/12/20. Maintaining NSR so far. Amiodarone discontinued due to side effects. If Afib recurs will need to consider alternative therapies.   8.   History of HIT.    Medication Adjustments/Labs and Tests Ordered: Current medicines are reviewed at length with the patient today.  Concerns regarding medicines are outlined above.   Tests Ordered: No orders of the defined types were placed in this encounter.    Medication Changes: No orders of the defined types were placed in this encounter.  Follow Up: 6 months   Signed, Meilech Virts [place], MD  01/20/2022 8:27 AM    [facility]

## 2022-01-24 ENCOUNTER — Encounter: Payer: Self-pay | Admitting: Cardiology

## 2022-01-24 ENCOUNTER — Ambulatory Visit (INDEPENDENT_AMBULATORY_CARE_PROVIDER_SITE_OTHER): Payer: BC Managed Care – PPO | Admitting: Cardiology

## 2022-01-24 VITALS — BP 120/85 | HR 65 | Ht 71.0 in | Wt 209.4 lb

## 2022-01-24 DIAGNOSIS — I480 Paroxysmal atrial fibrillation: Secondary | ICD-10-CM

## 2022-01-24 DIAGNOSIS — I422 Other hypertrophic cardiomyopathy: Secondary | ICD-10-CM | POA: Diagnosis not present

## 2022-01-24 DIAGNOSIS — Z950 Presence of cardiac pacemaker: Secondary | ICD-10-CM

## 2022-01-24 DIAGNOSIS — I442 Atrioventricular block, complete: Secondary | ICD-10-CM | POA: Diagnosis not present

## 2022-01-24 NOTE — Patient Instructions (Signed)
  Follow-Up: At [facility], you and your health needs are our priority.  As part of our continuing mission to provide you with exceptional heart care, we have created designated Provider Care Teams.  These Care Teams include your primary Cardiologist (physician) and Advanced Practice Providers (APPs -  Physician Assistants and Nurse Practitioners) who all work together to provide you with the care you need, when you need it.  We recommend signing up for the patient portal called "MyChart".  Sign up information is provided on this After Visit Summary.  MyChart is used to connect with patients for Virtual Visits (Telemedicine).  Patients are able to view lab/test results, encounter notes, upcoming appointments, etc.  Non-urgent messages can be sent to your provider as well.   To learn more about what you can do with MyChart, go to [email or web address].    Your next appointment:   12 month(s)  The format for your next appointment:   In Person  Provider:   Peter Jordan, MD      Important Information About Sugar       

## 2022-03-13 DIAGNOSIS — N400 Enlarged prostate without lower urinary tract symptoms: Secondary | ICD-10-CM | POA: Diagnosis not present

## 2022-03-20 DIAGNOSIS — R972 Elevated prostate specific antigen [PSA]: Secondary | ICD-10-CM | POA: Diagnosis not present

## 2022-03-20 DIAGNOSIS — N400 Enlarged prostate without lower urinary tract symptoms: Secondary | ICD-10-CM | POA: Diagnosis not present

## 2022-03-22 ENCOUNTER — Ambulatory Visit (INDEPENDENT_AMBULATORY_CARE_PROVIDER_SITE_OTHER): Payer: BC Managed Care – PPO

## 2022-03-22 DIAGNOSIS — Z950 Presence of cardiac pacemaker: Secondary | ICD-10-CM | POA: Diagnosis not present

## 2022-03-22 DIAGNOSIS — I422 Other hypertrophic cardiomyopathy: Secondary | ICD-10-CM | POA: Diagnosis not present

## 2022-03-22 LAB — CUP PACEART REMOTE DEVICE CHECK
Battery Remaining Longevity: 121 mo
Battery Voltage: 3.02 V
Brady Statistic AP VP Percent: 48.24 %
Brady Statistic AP VS Percent: 0.15 %
Brady Statistic AS VP Percent: 49.31 %
Brady Statistic AS VS Percent: 2.31 %
Brady Statistic RA Percent Paced: 49.11 %
Brady Statistic RV Percent Paced: 97.54 %
Date Time Interrogation Session: 20230809055258
Implantable Lead Implant Date: 20210604
Implantable Lead Implant Date: 20210604
Implantable Lead Location: 753859
Implantable Lead Location: 753860
Implantable Lead Model: 4076
Implantable Lead Model: 4076
Implantable Pulse Generator Implant Date: 20210604
Lead Channel Impedance Value: 266 Ohm
Lead Channel Impedance Value: 342 Ohm
Lead Channel Impedance Value: 399 Ohm
Lead Channel Impedance Value: 456 Ohm
Lead Channel Pacing Threshold Amplitude: 0.5 V
Lead Channel Pacing Threshold Amplitude: 0.5 V
Lead Channel Pacing Threshold Pulse Width: 0.4 ms
Lead Channel Pacing Threshold Pulse Width: 0.4 ms
Lead Channel Sensing Intrinsic Amplitude: 12.5 mV
Lead Channel Sensing Intrinsic Amplitude: 12.5 mV
Lead Channel Sensing Intrinsic Amplitude: 5.75 mV
Lead Channel Sensing Intrinsic Amplitude: 5.75 mV
Lead Channel Setting Pacing Amplitude: 1.5 V
Lead Channel Setting Pacing Amplitude: 2 V
Lead Channel Setting Pacing Pulse Width: 0.4 ms
Lead Channel Setting Sensing Sensitivity: 0.9 mV

## 2022-04-11 DIAGNOSIS — H43813 Vitreous degeneration, bilateral: Secondary | ICD-10-CM | POA: Diagnosis not present

## 2022-04-25 NOTE — Progress Notes (Signed)
Remote pacemaker transmission.   

## 2022-06-14 ENCOUNTER — Telehealth: Payer: Self-pay | Admitting: Cardiology

## 2022-06-14 NOTE — Telephone Encounter (Signed)
Called patient, he was requesting a name of another physician here in the office for a friend of his, information given to patient.   No other questions/concerns at this time.

## 2022-06-14 NOTE — Telephone Encounter (Signed)
Patient wants to speak to nurse about a referral. Wouldn't go into further detail.

## 2022-06-21 ENCOUNTER — Ambulatory Visit (INDEPENDENT_AMBULATORY_CARE_PROVIDER_SITE_OTHER): Payer: Medicare Other

## 2022-06-21 DIAGNOSIS — I442 Atrioventricular block, complete: Secondary | ICD-10-CM

## 2022-06-21 DIAGNOSIS — Z950 Presence of cardiac pacemaker: Secondary | ICD-10-CM | POA: Diagnosis not present

## 2022-06-22 LAB — CUP PACEART REMOTE DEVICE CHECK
Battery Remaining Longevity: 119 mo
Battery Voltage: 3.02 V
Brady Statistic AP VP Percent: 46.25 %
Brady Statistic AP VS Percent: 0.12 %
Brady Statistic AS VP Percent: 51.73 %
Brady Statistic AS VS Percent: 1.89 %
Brady Statistic RA Percent Paced: 46.88 %
Brady Statistic RV Percent Paced: 97.99 %
Date Time Interrogation Session: 20231108035025
Implantable Lead Connection Status: 753985
Implantable Lead Connection Status: 753985
Implantable Lead Implant Date: 20210604
Implantable Lead Implant Date: 20210604
Implantable Lead Location: 753859
Implantable Lead Location: 753860
Implantable Lead Model: 4076
Implantable Lead Model: 4076
Implantable Pulse Generator Implant Date: 20210604
Lead Channel Impedance Value: 285 Ohm
Lead Channel Impedance Value: 361 Ohm
Lead Channel Impedance Value: 418 Ohm
Lead Channel Impedance Value: 475 Ohm
Lead Channel Pacing Threshold Amplitude: 0.5 V
Lead Channel Pacing Threshold Amplitude: 0.625 V
Lead Channel Pacing Threshold Pulse Width: 0.4 ms
Lead Channel Pacing Threshold Pulse Width: 0.4 ms
Lead Channel Sensing Intrinsic Amplitude: 20.125 mV
Lead Channel Sensing Intrinsic Amplitude: 20.125 mV
Lead Channel Sensing Intrinsic Amplitude: 6.375 mV
Lead Channel Sensing Intrinsic Amplitude: 6.375 mV
Lead Channel Setting Pacing Amplitude: 1.5 V
Lead Channel Setting Pacing Amplitude: 2 V
Lead Channel Setting Pacing Pulse Width: 0.4 ms
Lead Channel Setting Sensing Sensitivity: 0.9 mV
Zone Setting Status: 755011
Zone Setting Status: 755011

## 2022-07-10 NOTE — Progress Notes (Signed)
Remote pacemaker transmission.   

## 2022-09-20 ENCOUNTER — Ambulatory Visit (INDEPENDENT_AMBULATORY_CARE_PROVIDER_SITE_OTHER): Payer: BC Managed Care – PPO

## 2022-09-20 DIAGNOSIS — I442 Atrioventricular block, complete: Secondary | ICD-10-CM

## 2022-09-20 LAB — CUP PACEART REMOTE DEVICE CHECK
Battery Remaining Longevity: 115 mo
Battery Voltage: 3.01 V
Brady Statistic AP VP Percent: 38.13 %
Brady Statistic AP VS Percent: 0.15 %
Brady Statistic AS VP Percent: 58.7 %
Brady Statistic AS VS Percent: 3.01 %
Brady Statistic RA Percent Paced: 39.25 %
Brady Statistic RV Percent Paced: 96.83 %
Date Time Interrogation Session: 20240207034259
Implantable Lead Connection Status: 753985
Implantable Lead Connection Status: 753985
Implantable Lead Implant Date: 20210604
Implantable Lead Implant Date: 20210604
Implantable Lead Location: 753859
Implantable Lead Location: 753860
Implantable Lead Model: 4076
Implantable Lead Model: 4076
Implantable Pulse Generator Implant Date: 20210604
Lead Channel Impedance Value: 285 Ohm
Lead Channel Impedance Value: 361 Ohm
Lead Channel Impedance Value: 399 Ohm
Lead Channel Impedance Value: 456 Ohm
Lead Channel Pacing Threshold Amplitude: 0.5 V
Lead Channel Pacing Threshold Amplitude: 0.625 V
Lead Channel Pacing Threshold Pulse Width: 0.4 ms
Lead Channel Pacing Threshold Pulse Width: 0.4 ms
Lead Channel Sensing Intrinsic Amplitude: 13.625 mV
Lead Channel Sensing Intrinsic Amplitude: 13.625 mV
Lead Channel Sensing Intrinsic Amplitude: 5.25 mV
Lead Channel Sensing Intrinsic Amplitude: 5.25 mV
Lead Channel Setting Pacing Amplitude: 1.5 V
Lead Channel Setting Pacing Amplitude: 2 V
Lead Channel Setting Pacing Pulse Width: 0.4 ms
Lead Channel Setting Sensing Sensitivity: 0.9 mV
Zone Setting Status: 755011
Zone Setting Status: 755011

## 2022-10-17 ENCOUNTER — Telehealth: Payer: Self-pay | Admitting: Cardiology

## 2022-10-17 NOTE — Progress Notes (Signed)
Remote pacemaker transmission.   

## 2022-10-17 NOTE — Telephone Encounter (Signed)
Called pt. He states "my breathing problems are back. I thought they took it all out at the [facility] but I think I need to see him." Appt made for April 10th. Asked pt if he wanted this message relied to Dr. [place] he states "it does not make a difference. I'll wait till April 10th."

## 2022-10-17 NOTE — Telephone Encounter (Signed)
Pt c/o Shortness Of Breath: STAT if SOB developed within the last 24 hours or pt is noticeably SOB on the phone  1. Are you currently SOB (can you hear that pt is SOB on the phone)? Yes -- refused to talk to anyone else but Dr. Doug Sou nurse. Did not want to wait to speak to anyone else.   2. How long have you been experiencing SOB? For awhile now he says, but was told to call when it came back.   3. Are you SOB when sitting or when up moving around? Moving around   4. Are you currently experiencing any other symptoms? No

## 2022-11-18 NOTE — H&P (View-Only) (Signed)
Date:  01/24/2022   ID:  Chase Hill, DOB Jul 31, 1946, MRN [id number]  PCP:  Garlan Fillers, MD  Cardiologist:  Ivory Maduro [place], MD  Electrophysiologist:  Lanier Prude, MD   Evaluation Performed:  Follow-Up Visit   Chief Complaint:  HOCM  History of Present Illness:    Chase Hill is a 77 y.o. male with PMH of hypertrophic cardiomyopathy, history of DVT/PE, TAA, hyperlipidemia, RBBB, Gilbert's syndrome, hypothyroidism.   Stress Myoview in 2011 showed inferior and apical ischemia.  Subsequent cardiac cath showed no obstructive disease.  In 2014, echocardiogram demonstrated basal septal hypertrophy with mild LV outflow tract gradient and chordal versus valvular SAM, moderate diastolic dysfunction.  Cardiac MRI obtained in June 2014 showed mild focal basal septal hypertrophy with systolic anterior motion of the mitral valve and mild MR.  There was no significant delayed enhancement.  He has been intolerant of pravastatin due to myalgia.  He also developed  fatigue and orthostasis on beta-blocker.   Due to increased septal hypertrophy and outflow gradient on previous echo in July 2018, he was started on Cardizem.  This was later discontinued due to concern of conduction disease with first-degree AV block, left anterior fascicular block and a right bundle branch block. He later underwent laser ablation of varicose veins by Dr. Darrick Penna. He was diagnosed with PE on CT angiogram of the chest in January 2019 and was placed on Xarelto.  PE was felt to be provoked by laser vein treatment.  He completed 6 months of therapy with Xarelto and switch to aspirin.  CT angiogram of the chest recently showed 4.6 cm thoracic aortic aneurysm.  Recent lower extremity venous Doppler obtained on 04/10/2019 was negative for DVT.  Repeat echocardiogram obtained on 04/24/2019, EF was greater than 65%, severe LVH, significant outflow obstruction at the mid cavity and  LVOT with peak gradient of at least 100  mmHg, systolic anterior motion of mitral leaflet, mild dilatation of the ascending aorta measuring 43 mm.   We did proceed with cardiac cath on 06/26/19. This showed normal coronary anatomy. Normal LV and right heart pressures. LVOT gradient 20 mm Hg by cath. MRI was also done showing typical features of HOCM with predominant sigmoid septum and SAM. There was only mild late gadolinium enhancement. An Event monitor was placed with results noted below.  He was subsequently referred to [facility] and underwent a Septal myectomy on 01/14/20 by Dr Maryanna Shape. On 01/16/20 he had a left sided DDD pacemaker (Medtronic XT AZT MRI) placed for development of complete heart block. He did well and was DC on 01/20/20. He was readmitted 6/24-6/29/21 with acute pulmonary embolus. He had a 10 day history of increased SOB. Was noted on pacemaker check to have paroxysmal AFib. Was started on Eliquis but symptoms progressed and he presented to the ED.EKG showed atrial flutter. Chest x-ray showed minimal fibrosis/atelectasis in left lung base, otherwise clear. Chest CT showed acute multilobe for segmental and subsegmental thrombo emboli affecting all lobes. Chest CT and bedside echocardiogram did not reveal evidence of right heart strain. The patient received IV fluids, loading dose of heparin and was initiated on high-intensity heparin GTT. The following morning the patient did have a dip in his platelets and vascular Medicine had concern for possible HIT, which was positive. The patient was transitioned to bivalirudin. Lupus anticoagulant was negative. Upon discharge the patient transition back to apixaban 10 mg b.i.d. x7 days and then 5 mg b.i.d. x3 months or indefinitely for  atrial fibrillation. LE doppler showed extensive DVT in left leg from the common femoral vein down.The patient's outpatient cardiologist, Dr. Gwendalyn Ege, was contacted regarding starting amiodarone while the patient was hospitalized. He recommended initiating amiodarone  200 mg b.i.d. x7 days followed by 200 mg daily with Cardiology follow up.  Since then he has had persistent Afib. Was seen by Dr Lalla Brothers who initially increased his amiodarone. He then developed side effects on the amiodarone with tremors, difficulty writing and typing,  and imbalance. He had DCCV on 04/12/20 and amiodarone was discontinued. Last pacer check in May 2023 showed Afib burden < 0.1%. In June 2022 Venous dopplers done at that time due to swelling and were unchanged from before with left femoral and popliteal chronic DVT.   Echo done 03/15/21 results as noted below.   He was doing very well until 6 weeks ago. Since then has noted increased fatigue and gives out completely after walking a flight of stairs or going up hill. No real palpitations. Does get SOB easily. No chest pain. Has been playing golf but finds this is difficult now. Before he would walk up 3 flights of stairs at work for exercise and he can no longer do so.  Still working.   His breathing is good. No chest pain or palpitations. His energy level is go states he will miss an occasional Eliquis dose in the evening - last 2 weeks ago.     Past Medical History:  Diagnosis Date   ALLERGIC RHINITIS 08/16/2009   Qualifier: Diagnosis of  By: Alwyn Ren MD, Chrissie Noa   Onset:as a child Character: perennial but increased seasonally Triggers: dust mites; minor as pollen Allergy testing: yes X 2; as child & in 1995, Dr Jethro Bolus. Never took shots Maintenance medications/ response:Loratidine daily Smoking history:never Family history pulmonary disease: no      Cervical radiculopathy at C7    PMH of   Chest pain 11/22/2012   Evaluated by Dr Marca Ancona, Cardiology 2014 ECHO, MR : ? hypertrophic cardiomyopathy Rx: Beta blocker    Diverticulosis    colonoscopy 10-15-2006   DIVERTICULOSIS, COLON 08/16/2009   Qualifier: Diagnosis of  By: Alwyn Ren MD, Chrissie Noa   Dr Jarold Motto, GI     DVT (deep venous thrombosis) (HCC)    ELECTROCARDIOGRAM, ABNORMAL  08/16/2009   Qualifier: Diagnosis of  By: Alwyn Ren MD, William   Right bundle branch block, left posterior fascicular block( bifascicular block), not present 01/17/2007; noted 08/16/2009     GERD (gastroesophageal reflux disease)    [place] Lone syndrome    GILBERT'S SYNDROME 01/14/2007   Qualifier: Diagnosis of  By: Alwyn Ren MD, William     Hemorrhoids    Hyperlipidemia    Hypothyroidism    Obstructive hypertrophic cardiomyopathy (HCC) 01/15/2013   Perennial allergic rhinitis with seasonal variation    Pulmonary embolism (HCC)    REFLUX, ESOPHAGEAL 01/14/2007   Qualifier: Diagnosis of  By: Alwyn Ren MD, William     Thoracic aortic aneurysm without rupture (HCC) 03/20/2017   THYROID FUNCTION TEST, ABNORMAL 09/07/2010   Qualifier: Diagnosis of  By: Alwyn Ren MD, Chrissie Noa      Varicose veins of right lower extremity with complications 02/04/2018   Past Surgical History:  Procedure Laterality Date   CARDIOVERSION N/A 04/12/2020   Procedure: CARDIOVERSION;  Surgeon: Jodelle Red, MD;  Location: [facility] ENDOSCOPY;  Service: Cardiovascular;  Laterality: N/A;   COLONOSCOPY  2010   Tics   ENDOVENOUS ABLATION SAPHENOUS VEIN W/ LASER Right 08/21/2018   endovenous laser ablation right  greater saphenous vein and stab phlebectomy > 20 incisions right leg by Fabienne Bruns MD    RIGHT/LEFT HEART CATH AND CORONARY ANGIOGRAPHY N/A 06/26/2019   Procedure: RIGHT/LEFT HEART CATH AND CORONARY ANGIOGRAPHY;  Surgeon: [place], Ashir Kunz M, MD;  Location: [facility] INVASIVE CV LAB;  Service: Cardiovascular;  Laterality: N/A;   WISDOM TOOTH EXTRACTION       Current Meds  Medication Sig   ELIQUIS 5 MG TABS tablet Take 5 mg by mouth 2 (two) times daily.   levothyroxine (SYNTHROID) 150 MCG tablet Take 150 mcg by mouth daily.   loratadine (CLARITIN) 10 MG tablet Take 10 mg by mouth daily after breakfast.    Melatonin 5 MG CAPS Take 5 mg by mouth at bedtime as needed (sleep).   Multiple Vitamin (MULTIVITAMIN WITH MINERALS) TABS tablet Take 1  tablet by mouth every evening.   rosuvastatin (CRESTOR) 5 MG tablet Take 5 mg by mouth every Monday, Wednesday, and Friday at 8 PM. BEDTIME.     Allergies:   Heparin   Social History   Tobacco Use   Smoking status: Never   Smokeless tobacco: Never  Vaping Use   Vaping Use: Never used  Substance Use Topics   Alcohol use: Yes    Alcohol/week: 2.0 standard drinks of alcohol    Types: 2 Glasses of wine per week    Comment:  < 3 /week , only socially   Drug use: No     Family Hx: The patient's family history includes Breast cancer in his mother; Congestive Heart Failure in his father; Coronary artery disease in his father and paternal aunt; Stroke (age of onset: 43) in his maternal grandmother. There is no history of Diabetes, Hypertension, or Colon cancer.  ROS:   Please see the history of present illness.    All other systems reviewed and are negative.   Prior CV studies:   The following studies were reviewed today:  CTA of chest 02/19/2019 IMPRESSION: 1. No change in caliber of the tubular ascending thoracic aorta, measuring 4.6 x 4.3 cm, not significantly changed in caliber, measuring 4.5 x 4.2 cm on examinations dating back to 03/23/2017. The sinuses of Valsalva measure up to 4.4 cm in caliber. Normal caliber of the aortic valve. The descending thoracic aorta is normal in caliber. No significant atherosclerosis.   2. Examination is not tailored for the evaluation of pulmonary embolism, however there is no obvious residual pulmonary embolism in the right lower lobe at the site of embolism seen on prior examination dated 09/11/2018.   3.  Stable, benign small pulmonary nodules.     Venous doppler 04/10/2019 Summary: Right: Successful vein closure. There is no evidence of deep vein thrombosis in the lower extremity. No cystic structure found in the popliteal fossa. No significant change compared to previous study. Left: No evidence of common femoral vein obstruction.    *See table(s) above for measurements and observations.     Echo 04/24/2019 IMPRESSIONS  1. The left ventricle has hyperdynamic systolic function, with an ejection fraction of >65%. The cavity size was normal. There is severely increased left ventricular wall thickness. Left ventricular diastolic Doppler parameters are consistent with  impaired relaxation.  2. LVOT is narrowed The LV is severely hypertrophied. There is significant outflow obstruction at mid cavity and through LVOT with peak gradient at least 100 mm Hg. All consistent with HOCM.  3. The right ventricle has normal systolic function. The cavity was normal. There is no increase in right ventricular wall  thickness.  4. Left atrial size was mildly dilated.  5. There is systolic anterior motion of mitral leaflets.  6. The aortic valve is tricuspid. Mild thickening of the aortic valve. Aortic valve regurgitation is mild by color flow Doppler.  7. The aorta is abnormal unless otherwise noted.  8. The ascending aorta is normal in size and structure.  9. There is mild dilatation of the ascending aorta measuring 43 mm.    Cardiac cath 06/26/19:  RIGHT/LEFT HEART CATH AND CORONARY ANGIOGRAPHY  Conclusion    The left ventricular systolic function is normal. LV end diastolic pressure is normal. The left ventricular ejection fraction is 55-65% by visual estimate.   1. Normal coronary anatomy 2. Normal LV function' 3. 20 mm Hg LVOT gradient at rest. Typical Brockenbrough sign post PVC. 4. Normal/low LV filling pressures 5. Normal right  Heart pressures 6. Normal cardiac output 7. Moderate MR but post PVC   Plan: recommend further evaluation with cardiac MRI and event monitor.      CLINICAL DATA:  Hypertrophic cardiomyopathy suspected, further testing   COMPARISON: No images available from prior cardiac MRI for comparison   EXAM: CARDIAC MRI   TECHNIQUE: The patient was scanned on a 1.5 Tesla GE magnet. A  dedicated cardiac coil was used. Functional imaging was done using Fiesta sequences. 2,3, and 4 chamber views were done to assess for RWMA's. Modified Simpson's rule using a short axis stack was used to calculate an ejection fraction on a dedicated work [employer]. The patient received 10mL GADAVIST GADOBUTROL 1 MMOL/ML IV SOLN. After 10 minutes inversion recovery sequences were used to assess for infiltration and scar tissue.   CONTRAST:  10mL GADAVIST GADOBUTROL 1 MMOL/ML IV SOLN   FINDINGS: LEFT VENTRICLE:   Normal left ventricular size. Hyperdynamic systolic function (LVEF = 69%).   There are no regional wall motion abnormalities.   Late gadolinium enhancement in the left ventricular myocardium is seen in the inferior RV insertion point at the mid ventricle and apex. This finding is seen in an area of increased myocardial wall thickness. Differential diagnosis includes fibrosis secondary to hypertrophic cardiomyopathy vs increased pulmonary pressures.   Normal T1 myocardial nulling kinetics suggest against a diagnosis of cardiac amyloidosis.   Maximal wall thickness: 18 mm   Location: Basal septum.   There is systolic anterior motion of the mitral valve. Flow dephasing suggests left ventricular outflow tract obstruction.   Mitral regurgitation is present and posteriorly directed.   No evidence of left ventricular apical aneurysm.   Findings are consistent with hypertrophic cardiomyopathy with obstruction. Morphologic subtype: Sigmoid subtype.   RIGHT VENTRICLE:   Normal right ventricular size, thickness and systolic function (RVEF = 63%).   There are no regional wall motion abnormalities.   No delayed myocardial enhancement in the right ventricle.   ATRIA:   Mildly dilated left atrial chamber size.  normal right atrial size.   VALVES:   Systolic anterior motion of the mitral valve with posteriorly directed mitral regurgitation,  qualitatively mild.   Qualitatively trivial aortic valve regurgitation by flow dephasing.   PERICARDIUM:   Normal pericardium.  No pericardial effusion.   AORTA:   Mid ascending aorta measures 43 mm in a sagittal transverse plane. Orthogonal measurements unable to be performed for true axial measurement.   OTHER:   MEASUREMENTS:   LVEDV: 145 ml   LVESV: 45 ml   SV: 100 ml   CO: 6 L/min   Myocardial mass: 184 g  RVEDV: 100 ml   RVEDS: 38 ml   RVSV: 63 mL   IMPRESSION: 1.  Hyperdynamic left ventricular systolic function.  LVEF 69%.   2.  Normal right ventricular chamber size and function.  RVEF 63%.   3. Findings consistent with hypertrophic cardiomyopathy, sigmoid subtype, maximal wall thickness 18 mm at the basal left ventricular septum.   4. Left ventricular outflow tract obstruction. Flow dephasing seen in the LVOT with systolic anterior motion of the mitral valve, and posteriorly directed mitral valve regurgitation.   5. Delayed myocardial enhancement is seen in an area of increased left ventricular wall thickness at the mid ventricle, at the inferior RV insertion point. This finding could represent fibrosis in the setting hypertrophic cardiomyopathy vs. increased pulmonary pressures.   6.  Mid ascending aorta measures 43 mm in a transverse plane.     Electronically Signed   By: Weston Brass  Event monitor 08/19/19.Study Highlights  Normal sinus rhythm with first degree AV block Episodes of second degree AV block Mobitz type 1 PVCs. One run of AIVR PACs with short bursts of PACs- longest 6 beats. No Afib noted- episodes noted on monitor appear to be artifact.   Echo 01/19/20:Echocardiogram 01/19/2020 Final Impressions 1. Hypertrophic cardiomyopathy status post left ventricular septal myectomy (14-Jan-2020). 2. Systolic anterior motion of mitral apparatus with trivial mitral valve regurgitation. No dynamic left ventricular outflow tract  obstruction at rest. 3. Mild aortic valve regurgitation (central jet). ERO 0.07 cm2, regurgitant volume 19 mL. 4. Normal left ventricular chamber size, calculated 2-D linear ejection fraction 70 %. 5. Normal right ventricular chamber size, mildly reduced systolic function, estimated right ventricular systolic pressure 22 mmHg (systolic blood pressure 129 mmHg). 6. Mildly enlarged mid ascending aorta diameter (diameter 45 mm at mid level). Upper limits of normal for age, sex, and BSA = 43 mm. 7. No pericardial effusion.    Echo 01/19/20: Final Impressions  1. Hypertrophic cardiomyopathy status post left ventricular septal myectomy (14-Jan-2020).  2. Systolic anterior motion of mitral apparatus with trivial mitral valve regurgitation. No  dynamic left ventricular outflow tract obstruction at rest.  3. Mild aortic valve regurgitation (central jet). ERO 0.07 cm^2, regurgitant volume 19 mL.  4. Normal left ventricular chamber size, calculated 2-D linear ejection fraction 70 %.  5. Normal right ventricular chamber size, mildly reduced systolic function, estimated right  ventricular systolic pressure 22 mmHg (systolic blood pressure 129 mmHg).  6. Mildly enlarged mid ascending aorta diameter (diameter 45 mm at mid level).  Upper limits of  normal for age, sex, and BSA = 43 mm.  7. No pericardial effusion.  Echo : 03/15/21: IMPRESSIONS     1. Left ventricular ejection fraction, by estimation, is 55 to 60%. The  left ventricle has normal function. The left ventricle has no regional  wall motion abnormalities but there is septal-lateral dyssynchrony  consistent with RV pacing. There is mild  left ventricular hypertrophy. Left ventricular diastolic parameters are  consistent with Grade I diastolic dysfunction (impaired relaxation). There  is no significant LV outflow gradient noted and no mitral valve SAM noted.   2. Right ventricular systolic function is normal. The right ventricular  size is normal.  Tricuspid regurgitation signal is inadequate for assessing  PA pressure.   3. The mitral valve is normal in structure. Trivial mitral valve  regurgitation. No evidence of mitral stenosis.   4. The aortic valve is tricuspid. Aortic valve regurgitation is mild to  moderate. Mild aortic valve sclerosis is present,  with no evidence of  aortic valve stenosis.   5. Aortic dilatation noted. There is moderate dilatation of the ascending  aorta, measuring 46 mm.   6. The inferior vena cava is normal in size with greater than 50%  respiratory variability, suggesting right atrial pressure of 3 mmHg.   Comparison(s): 04/24/19 EF >65%.    Labs/Other Tests and Data Reviewed:    EKG: today shows atrial flutter with V pacing Rate 64. I have personally reviewed and interpreted this study.   Recent Labs: No results found for requested labs within last 365 days.   Recent Lipid Panel Lab Results  Component Value Date/Time   CHOL 182 06/23/2019 09:19 AM   CHOL 202 (H) 04/29/2015 07:34 AM   TRIG 180 (H) 06/23/2019 09:19 AM   TRIG 165 (H) 04/29/2015 07:34 AM   HDL 51 06/23/2019 09:19 AM   HDL 47 04/29/2015 07:34 AM   CHOLHDL 3.6 06/23/2019 09:19 AM   CHOLHDL 3 08/24/2016 07:27 AM   LDLCALC 100 (H) 06/23/2019 09:19 AM   LDLCALC 122 (H) 04/29/2015 07:34 AM   LDLDIRECT 78.7 03/28/2013 07:32 AM   Dated 08/12/19: creatinine 1.2. otherwise CMET normal. CBC and TSH normal. Cholesterol 177, triglycerides 209, HDL 43, LDL 92.  Dated 02/19/20: Hgb 12.9, creatinine 1.3. TSH 5.22. ALT 25. Dated 08/19/20: cholesterol 161, triglycerides 186, HDL 43, LDL 81. CMET normal.  Dated 10/12/21: cholesterol 153, triglycerides 105, HDL 45, LDL 89. Creatinine 1.36. otherwise CMET, CBC, and TFTs normal.   Wt Readings from Last 3 Encounters:  01/24/22 209 lb 6.4 oz (95 kg)  04/12/21 209 lb (94.8 kg)  01/28/21 208 lb (94.3 kg)     Objective:    Vital Signs:  BP 120/85 (BP Location: Left Arm)   Pulse 65   Ht 5\' 11"   (1.803 m)   Wt 209 lb 6.4 oz (95 kg)   SpO2 96%   BMI 29.21 kg/m    GENERAL:  Well appearing WM in NAD HEENT:  PERRL, EOMI, sclera are clear. Oropharynx is clear. NECK:  No jugular venous distention, carotid upstroke brisk and symmetric, no bruits, no thyromegaly or adenopathy LUNGS:  Clear to auscultation bilaterally CHEST:  Unremarkable HEART:  RRR,  PMI not displaced or sustained,S1 and S2 within normal limits, no S3, no S4: no clicks, no rubs, no murmurs.  ABD:  Soft, nontender. BS +, no masses or bruits. No hepatomegaly, no splenomegaly EXT:  2 + pulses throughout, trace LLE edema, no cyanosis no clubbing no redness SKIN:  Warm and dry.  No rashes NEURO:  Alert and oriented x 3. Cranial nerves II through XII intact. PSYCH:  Cognitively intact   ASSESSMENT & PLAN:    Hypertrophic cardiomyopathy:  Now s/p septal Myectomy at [facility] in June 2021. S/p DDDR pacemaker for CHB which was anticipated. Course complicated by development of large Left leg DVT and pulmonary embolus. This was associated with  AFib/flutter.On Eliquis.  Echo post op at [facility] on 01/19/20 showed no outflow gradient and trivial MR. Normal EF. Echo in August 2022 showed no LV outflow gradient. Mild LVH. Mild to mod AI.    Thoracic aortic aneurysm: Mildly dilated aorta on recent echocardiogram measuring up to 43 mm. Follow up with Echo   Atrial flutter - first recurrence since 2021. Quite symptomatic. Will arrange DCCV in 2 weeks - to allow complete compliance with Eliquis. Update labs today. Will arrange for Echo. Needs follow up with Dr Lalla Brothers to consider ablation. Intolerant of amiodarone  in the past   Hypothyroidism: On Synthroid. Check TFTs   History of PE past- more recent DVT/ PE post op heart surgery. Repeat venous doppler showed persistent common femoral and popliteal DVT but improved signal distally.Will continue Eliquis chronically and support hose.   Second degree AV block Mobitz type 1 progressing to  CHB post septal myectomy. S/p DDDR pacemaker with Medtronic generator. Pacer check in Feb normal.   7. Hypercholesterolemia. On statin- update labs.  8.   History of HIT.    Medication Adjustments/Labs and Tests Ordered: Current medicines are reviewed at length with the patient today.  Concerns regarding medicines are outlined above.   Tests Ordered: Orders Placed This Encounter  Procedures   EKG 12-Lead     Medication Changes: No orders of the defined types were placed in this encounter.    Follow Up: 3 months.   Signed, Lukka Black [place], MD  01/24/2022 9:01 AM    [facility]

## 2022-11-18 NOTE — Progress Notes (Unsigned)
 Date:  01/20/2022   ID:  Mitsuru L Goetzinger, DOB 08/28/1945, MRN [id number]  PCP:  Paterson, Daniel G, MD  Cardiologist:  Mikesha Migliaccio, MD  Electrophysiologist:  CAMERON T LAMBERT, MD   Evaluation Performed:  Follow-Up Visit   Chief Complaint:  HOCM  History of Present Illness:    Chase Hill is a 77 y.o. male with PMH of hypertrophic cardiomyopathy, history of DVT/PE, TAA, hyperlipidemia, RBBB, Gilbert's syndrome, hypothyroidism.   Stress Myoview in 2011 showed inferior and apical ischemia.  Subsequent cardiac cath showed no obstructive disease.  In 2014, echocardiogram demonstrated basal septal hypertrophy with mild LV outflow tract gradient and chordal versus valvular SAM, moderate diastolic dysfunction.  Cardiac MRI obtained in June 2014 showed mild focal basal septal hypertrophy with systolic anterior motion of the mitral valve and mild MR.  There was no significant delayed enhancement.  He has been intolerant of pravastatin due to myalgia.  He also developed  fatigue and orthostasis on beta-blocker.   Due to increased septal hypertrophy and outflow gradient on previous echo in July 2018, he was started on Cardizem.  This was later discontinued due to concern of conduction disease with first-degree AV block, left anterior fascicular block and a right bundle branch block. He later underwent laser ablation of varicose veins by Dr. Fields. He was diagnosed with PE on CT angiogram of the chest in January 2019 and was placed on Xarelto.  PE was felt to be provoked by laser vein treatment.  He completed 6 months of therapy with Xarelto and switch to aspirin.  CT angiogram of the chest recently showed 4.6 cm thoracic aortic aneurysm.  Recent lower extremity venous Doppler obtained on 04/10/2019 was negative for DVT.  Repeat echocardiogram obtained on 04/24/2019, EF was greater than 65%, severe LVH, significant outflow obstruction at the mid cavity and  LVOT with peak gradient of at least 100 mmHg,  systolic anterior motion of mitral leaflet, mild dilatation of the ascending aorta measuring 43 mm.   We did proceed with cardiac cath on 06/26/19. This showed normal coronary anatomy. Normal LV and right heart pressures. LVOT gradient 20 mm Hg by cath. MRI was also done showing typical features of HOCM with predominant sigmoid septum and SAM. There was only mild late gadolinium enhancement. An Event monitor was placed with results noted below.  He was subsequently referred to [facility] and underwent a Septal myectomy on 01/14/20 by Dr Schaff. On 01/16/20 he had a left sided DDD pacemaker (Medtronic XT AZT MRI) placed for development of complete heart block. He did well and was DC on 01/20/20. He was readmitted 6/24-6/29/21 with acute pulmonary embolus. He had a 10 day history of increased SOB. Was noted on pacemaker check to have paroxysmal AFib. Was started on Eliquis but symptoms progressed and he presented to the ED.EKG showed atrial flutter. Chest x-ray showed minimal fibrosis/atelectasis in left lung base, otherwise clear. Chest CT showed acute multilobe for segmental and subsegmental thrombo emboli affecting all lobes. Chest CT and bedside echocardiogram did not reveal evidence of right heart strain. The patient received IV fluids, loading dose of heparin and was initiated on high-intensity heparin GTT. The following morning the patient did have a dip in his platelets and vascular Medicine had concern for possible HIT, which was positive. The patient was transitioned to bivalirudin. Lupus anticoagulant was negative. Upon discharge the patient transition back to apixaban 10 mg b.i.d. x7 days and then 5 mg b.i.d. x3 months or indefinitely for   atrial fibrillation. LE doppler showed extensive DVT in left leg from the common femoral vein down.The patient's outpatient cardiologist, Dr. Killu, was contacted regarding starting amiodarone while the patient was hospitalized. He recommended initiating amiodarone 200 mg  b.i.d. x7 days followed by 200 mg daily with Cardiology follow up.  Since then he has had persistent Afib. Was seen by Dr Lambert who initially increased his amiodarone. He then developed side effects on the amiodarone with tremors, difficulty writing and typing,  and imbalance. He had DCCV on 04/12/20 and amiodarone was discontinued. Last pacer check in November showed Afib burden < 0.1%. He was seen by EP in June and doing well. Venous dopplers done at that time due to swelling and were unchanged from before with left femoral and popliteal chronic DVT.   Echo done 03/15/21 results as noted below.   On follow up today he states that he is doing well. Active playing golf and working.   His breathing is good. No chest pain or palpitations. His energy level is good. No pain.. he feels it in his left leg at times but no increased swelling. Does wear compression hose.     Past Medical History:  Diagnosis Date   ALLERGIC RHINITIS 08/16/2009   Qualifier: Diagnosis of  By: Hopper MD, William   Onset:as a child Character: perennial but increased seasonally Triggers: dust mites; minor as pollen Allergy testing: yes X 2; as child & in 1995, Dr Gene Alliance. Never took shots Maintenance medications/ response:Loratidine daily Smoking history:never Family history pulmonary disease: no      Cervical radiculopathy at C7    PMH of   Chest pain 11/22/2012   Evaluated by Dr Dalton McLean, Cardiology 2014 ECHO, MR : ? hypertrophic cardiomyopathy Rx: Beta blocker    Diverticulosis    colonoscopy 10-15-2006   DIVERTICULOSIS, COLON 08/16/2009   Qualifier: Diagnosis of  By: Hopper MD, William   Dr Patterson, GI     DVT (deep venous thrombosis) (HCC)    ELECTROCARDIOGRAM, ABNORMAL 08/16/2009   Qualifier: Diagnosis of  By: Hopper MD, William   Right bundle branch block, left posterior fascicular block( bifascicular block), not present 01/17/2007; noted 08/16/2009     GERD (gastroesophageal reflux disease)    Gilbert syndrome     GILBERT'S SYNDROME 01/14/2007   Qualifier: Diagnosis of  By: Hopper MD, William     Hemorrhoids    Hyperlipidemia    Hypothyroidism    Obstructive hypertrophic cardiomyopathy (HCC) 01/15/2013   Perennial allergic rhinitis with seasonal variation    Pulmonary embolism (HCC)    REFLUX, ESOPHAGEAL 01/14/2007   Qualifier: Diagnosis of  By: Hopper MD, William     Thoracic aortic aneurysm without rupture (HCC) 03/20/2017   THYROID FUNCTION TEST, ABNORMAL 09/07/2010   Qualifier: Diagnosis of  By: Hopper MD, William      Varicose veins of right lower extremity with complications 02/04/2018   Past Surgical History:  Procedure Laterality Date   CARDIOVERSION N/A 04/12/2020   Procedure: CARDIOVERSION;  Surgeon: Christopher, Bridgette, MD;  Location: [facility] ENDOSCOPY;  Service: Cardiovascular;  Laterality: N/A;   COLONOSCOPY  2010   Tics   ENDOVENOUS ABLATION SAPHENOUS VEIN W/ LASER Right 08/21/2018   endovenous laser ablation right greater saphenous vein and stab phlebectomy > 20 incisions right leg by Charles Fields MD    RIGHT/LEFT HEART CATH AND CORONARY ANGIOGRAPHY N/A 06/26/2019   Procedure: RIGHT/LEFT HEART CATH AND CORONARY ANGIOGRAPHY;  Surgeon: Tarita Deshmukh M, MD;  Location: [facility] INVASIVE CV LAB;    Service: Cardiovascular;  Laterality: N/A;   WISDOM TOOTH EXTRACTION       No outpatient medications have been marked as taking for the 01/24/22 encounter (Appointment) with Corinda Ammon M, MD.     Allergies:   Heparin   Social History   Tobacco Use   Smoking status: Never   Smokeless tobacco: Never  Vaping Use   Vaping Use: Never used  Substance Use Topics   Alcohol use: Yes    Alcohol/week: 2.0 standard drinks of alcohol    Types: 2 Glasses of wine per week    Comment:  < 3 /week , only socially   Drug use: No     Family Hx: The patient's family history includes Breast cancer in his mother; Congestive Heart Failure in his father; Coronary artery disease in his father and paternal aunt;  Stroke (age of onset: 89) in his maternal grandmother. There is no history of Diabetes, Hypertension, or Colon cancer.  ROS:   Please see the history of present illness.    All other systems reviewed and are negative.   Prior CV studies:   The following studies were reviewed today:  CTA of chest 02/19/2019 IMPRESSION: 1. No change in caliber of the tubular ascending thoracic aorta, measuring 4.6 x 4.3 cm, not significantly changed in caliber, measuring 4.5 x 4.2 cm on examinations dating back to 03/23/2017. The sinuses of Valsalva measure up to 4.4 cm in caliber. Normal caliber of the aortic valve. The descending thoracic aorta is normal in caliber. No significant atherosclerosis.   2. Examination is not tailored for the evaluation of pulmonary embolism, however there is no obvious residual pulmonary embolism in the right lower lobe at the site of embolism seen on prior examination dated 09/11/2018.   3.  Stable, benign small pulmonary nodules.     Venous doppler 04/10/2019 Summary: Right: Successful vein closure. There is no evidence of deep vein thrombosis in the lower extremity. No cystic structure found in the popliteal fossa. No significant change compared to previous study. Left: No evidence of common femoral vein obstruction.   *See table(s) above for measurements and observations.     Echo 04/24/2019 IMPRESSIONS  1. The left ventricle has hyperdynamic systolic function, with an ejection fraction of >65%. The cavity size was normal. There is severely increased left ventricular wall thickness. Left ventricular diastolic Doppler parameters are consistent with  impaired relaxation.  2. LVOT is narrowed The LV is severely hypertrophied. There is significant outflow obstruction at mid cavity and through LVOT with peak gradient at least 100 mm Hg. All consistent with HOCM.  3. The right ventricle has normal systolic function. The cavity was normal. There is no increase in right  ventricular wall thickness.  4. Left atrial size was mildly dilated.  5. There is systolic anterior motion of mitral leaflets.  6. The aortic valve is tricuspid. Mild thickening of the aortic valve. Aortic valve regurgitation is mild by color flow Doppler.  7. The aorta is abnormal unless otherwise noted.  8. The ascending aorta is normal in size and structure.  9. There is mild dilatation of the ascending aorta measuring 43 mm.    Cardiac cath 06/26/19:  RIGHT/LEFT HEART CATH AND CORONARY ANGIOGRAPHY  Conclusion    The left ventricular systolic function is normal. LV end diastolic pressure is normal. The left ventricular ejection fraction is 55-65% by visual estimate.   1. Normal coronary anatomy 2. Normal LV function' 3. 20 mm Hg LVOT gradient at rest.   Typical Brockenbrough sign post PVC. 4. Normal/low LV filling pressures 5. Normal right  Heart pressures 6. Normal cardiac output 7. Moderate MR but post PVC   Plan: recommend further evaluation with cardiac MRI and event monitor.      CLINICAL DATA:  Hypertrophic cardiomyopathy suspected, further testing   COMPARISON: No images available from prior cardiac MRI for comparison   EXAM: CARDIAC MRI   TECHNIQUE: The patient was scanned on a 1.5 Tesla GE magnet. A dedicated cardiac coil was used. Functional imaging was done using Fiesta sequences. 2,3, and 4 chamber views were done to assess for RWMA's. Modified Simpson's rule using a short axis stack was used to calculate an ejection fraction on a dedicated work station using Circle software. The patient received 10mL GADAVIST GADOBUTROL 1 MMOL/ML IV SOLN. After 10 minutes inversion recovery sequences were used to assess for infiltration and scar tissue.   CONTRAST:  10mL GADAVIST GADOBUTROL 1 MMOL/ML IV SOLN   FINDINGS: LEFT VENTRICLE:   Normal left ventricular size. Hyperdynamic systolic function (LVEF = 69%).   There are no regional wall motion abnormalities.    Late gadolinium enhancement in the left ventricular myocardium is seen in the inferior RV insertion point at the mid ventricle and apex. This finding is seen in an area of increased myocardial wall thickness. Differential diagnosis includes fibrosis secondary to hypertrophic cardiomyopathy vs increased pulmonary pressures.   Normal T1 myocardial nulling kinetics suggest against a diagnosis of cardiac amyloidosis.   Maximal wall thickness: 18 mm   Location: Basal septum.   There is systolic anterior motion of the mitral valve. Flow dephasing suggests left ventricular outflow tract obstruction.   Mitral regurgitation is present and posteriorly directed.   No evidence of left ventricular apical aneurysm.   Findings are consistent with hypertrophic cardiomyopathy with obstruction. Morphologic subtype: Sigmoid subtype.   RIGHT VENTRICLE:   Normal right ventricular size, thickness and systolic function (RVEF = 63%).   There are no regional wall motion abnormalities.   No delayed myocardial enhancement in the right ventricle.   ATRIA:   Mildly dilated left atrial chamber size.  normal right atrial size.   VALVES:   Systolic anterior motion of the mitral valve with posteriorly directed mitral regurgitation, qualitatively mild.   Qualitatively trivial aortic valve regurgitation by flow dephasing.   PERICARDIUM:   Normal pericardium.  No pericardial effusion.   AORTA:   Mid ascending aorta measures 43 mm in a sagittal transverse plane. Orthogonal measurements unable to be performed for true axial measurement.   OTHER:   MEASUREMENTS:   LVEDV: 145 ml   LVESV: 45 ml   SV: 100 ml   CO: 6 L/min   Myocardial mass: 184 g   RVEDV: 100 ml   RVEDS: 38 ml   RVSV: 63 mL   IMPRESSION: 1.  Hyperdynamic left ventricular systolic function.  LVEF 69%.   2.  Normal right ventricular chamber size and function.  RVEF 63%.   3. Findings consistent with hypertrophic  cardiomyopathy, sigmoid subtype, maximal wall thickness 18 mm at the basal left ventricular septum.   4. Left ventricular outflow tract obstruction. Flow dephasing seen in the LVOT with systolic anterior motion of the mitral valve, and posteriorly directed mitral valve regurgitation.   5. Delayed myocardial enhancement is seen in an area of increased left ventricular wall thickness at the mid ventricle, at the inferior RV insertion point. This finding could represent fibrosis in the setting hypertrophic cardiomyopathy vs. increased pulmonary pressures.     6.  Mid ascending aorta measures 43 mm in a transverse plane.     Electronically Signed   By: Gayatri  Acharya  Event monitor 08/19/19.Study Highlights  Normal sinus rhythm with first degree AV block Episodes of second degree AV block Mobitz type 1 PVCs. One run of AIVR PACs with short bursts of PACs- longest 6 beats. No Afib noted- episodes noted on monitor appear to be artifact.   Echo 01/19/20:Echocardiogram 01/19/2020 Final Impressions 1. Hypertrophic cardiomyopathy status post left ventricular septal myectomy (14-Jan-2020). 2. Systolic anterior motion of mitral apparatus with trivial mitral valve regurgitation. No dynamic left ventricular outflow tract obstruction at rest. 3. Mild aortic valve regurgitation (central jet). ERO 0.07 cm2, regurgitant volume 19 mL. 4. Normal left ventricular chamber size, calculated 2-D linear ejection fraction 70 %. 5. Normal right ventricular chamber size, mildly reduced systolic function, estimated right ventricular systolic pressure 22 mmHg (systolic blood pressure 129 mmHg). 6. Mildly enlarged mid ascending aorta diameter (diameter 45 mm at mid level). Upper limits of normal for age, sex, and BSA = 43 mm. 7. No pericardial effusion.    Echo 01/19/20: Final Impressions  1. Hypertrophic cardiomyopathy status post left ventricular septal myectomy (14-Jan-2020).  2. Systolic anterior motion of mitral  apparatus with trivial mitral valve regurgitation. No  dynamic left ventricular outflow tract obstruction at rest.  3. Mild aortic valve regurgitation (central jet). ERO 0.07 cm^2, regurgitant volume 19 mL.  4. Normal left ventricular chamber size, calculated 2-D linear ejection fraction 70 %.  5. Normal right ventricular chamber size, mildly reduced systolic function, estimated right  ventricular systolic pressure 22 mmHg (systolic blood pressure 129 mmHg).  6. Mildly enlarged mid ascending aorta diameter (diameter 45 mm at mid level).  Upper limits of  normal for age, sex, and BSA = 43 mm.  7. No pericardial effusion.  Echo : 03/15/21: IMPRESSIONS     1. Left ventricular ejection fraction, by estimation, is 55 to 60%. The  left ventricle has normal function. The left ventricle has no regional  wall motion abnormalities but there is septal-lateral dyssynchrony  consistent with RV pacing. There is mild  left ventricular hypertrophy. Left ventricular diastolic parameters are  consistent with Grade I diastolic dysfunction (impaired relaxation). There  is no significant LV outflow gradient noted and no mitral valve SAM noted.   2. Right ventricular systolic function is normal. The right ventricular  size is normal. Tricuspid regurgitation signal is inadequate for assessing  PA pressure.   3. The mitral valve is normal in structure. Trivial mitral valve  regurgitation. No evidence of mitral stenosis.   4. The aortic valve is tricuspid. Aortic valve regurgitation is mild to  moderate. Mild aortic valve sclerosis is present, with no evidence of  aortic valve stenosis.   5. Aortic dilatation noted. There is moderate dilatation of the ascending  aorta, measuring 46 mm.   6. The inferior vena cava is normal in size with greater than 50%  respiratory variability, suggesting right atrial pressure of 3 mmHg.   Comparison(s): 04/24/19 EF >65%.    Labs/Other Tests and Data Reviewed:    EKG:  No  ECG reviewed.  Recent Labs: No results found for requested labs within last 365 days.   Recent Lipid Panel Lab Results  Component Value Date/Time   CHOL 182 06/23/2019 09:19 AM   CHOL 202 (H) 04/29/2015 07:34 AM   TRIG 180 (H) 06/23/2019 09:19 AM   TRIG 165 (H) 04/29/2015 07:34 AM   HDL 51   06/23/2019 09:19 AM   HDL 47 04/29/2015 07:34 AM   CHOLHDL 3.6 06/23/2019 09:19 AM   CHOLHDL 3 08/24/2016 07:27 AM   LDLCALC 100 (H) 06/23/2019 09:19 AM   LDLCALC 122 (H) 04/29/2015 07:34 AM   LDLDIRECT 78.7 03/28/2013 07:32 AM   Dated 08/12/19: creatinine 1.2. otherwise CMET normal. CBC and TSH normal. Cholesterol 177, triglycerides 209, HDL 43, LDL 92.  Dated 02/19/20: Hgb 12.9, creatinine 1.3. TSH 5.22. ALT 25. Dated 08/19/20: cholesterol 161, triglycerides 186, HDL 43, LDL 81. CMET normal.  Dated 10/12/21: cholesterol 153, triglycerides 105, HDL 45, LDL 89. Creatinine 1.36. otherwise CMET, CBC, and TFTs normal.   Wt Readings from Last 3 Encounters:  04/12/21 209 lb (94.8 kg)  01/28/21 208 lb (94.3 kg)  08/18/20 210 lb (95.3 kg)     Objective:    Vital Signs:  There were no vitals taken for this visit.   GENERAL:  Well appearing WM in NAD HEENT:  PERRL, EOMI, sclera are clear. Oropharynx is clear. NECK:  No jugular venous distention, carotid upstroke brisk and symmetric, no bruits, no thyromegaly or adenopathy LUNGS:  Clear to auscultation bilaterally CHEST:  Unremarkable HEART:  RRR,  PMI not displaced or sustained,S1 and S2 within normal limits, no S3, no S4: no clicks, no rubs, no murmurs.  ABD:  Soft, nontender. BS +, no masses or bruits. No hepatomegaly, no splenomegaly EXT:  2 + pulses throughout, trace LLE edema, no cyanosis no clubbing no redness SKIN:  Warm and dry.  No rashes NEURO:  Alert and oriented x 3. Cranial nerves II through XII intact. PSYCH:  Cognitively intact   ASSESSMENT & PLAN:    Hypertrophic cardiomyopathy:  with marked increase in LVOT gradient 100 mm Hg  last year. Symptom of dyspnea on exertion and dizziness with exertion.  Intolerant of beta blocker in the past due to dizziness. Now s/p septal Myectomy at [facility] in June 2021. S/p DDDR pacemaker for CHB which was anticipated. Course complicated by development of large Left leg DVT and pulmonary embolus. This was associated with  AFib/flutter.On Eliquis.  Echo post op at [facility] on 01/19/20 showed no outflow gradient and trivial MR. Normal EF. Recent Echo showed no LV outflow gradient. Mild LVH. Mild to mod AI. Clinically doing very well.   Thoracic aortic aneurysm: Mildly dilated aorta on recent echocardiogram measuring up to 43 mm. Follow up yearly with Echo   Hyperlipidemia: Continue Crestor.   Hypothyroidism: On Synthroid. TSH OK    History of PE past- more recent DVT/ PE post op heart surgery. Repeat venous doppler showed persistent common femoral and popliteal DVT but improved signal distally.Will continue Eliquis chronically and support hose.   Second degree AV block Mobitz type 1 progressing to CHB post septal myectomy. S/p DDDR pacemaker with Medtronic generator. Pacer check on Aug 10 was OK.   7. Paroxysmal Afib/flutter. Post op heart surgery and in setting of acute PE. Was in persistent Afib/flutter in follow up. S/p successful DCCV on 04/12/20. Maintaining NSR so far. Amiodarone discontinued due to side effects. If Afib recurs will need to consider alternative therapies.   8.   History of HIT.    Medication Adjustments/Labs and Tests Ordered: Current medicines are reviewed at length with the patient today.  Concerns regarding medicines are outlined above.   Tests Ordered: No orders of the defined types were placed in this encounter.    Medication Changes: No orders of the defined types were placed in this encounter.      Follow Up: 6 months   Signed, Ford Peddie, MD  01/20/2022 8:27 AM    [facility] 

## 2022-11-22 ENCOUNTER — Ambulatory Visit: Payer: Medicare Other | Attending: Cardiology | Admitting: Cardiology

## 2022-11-22 ENCOUNTER — Other Ambulatory Visit: Payer: Self-pay | Admitting: Cardiology

## 2022-11-22 ENCOUNTER — Encounter: Payer: Self-pay | Admitting: Cardiology

## 2022-11-22 VITALS — BP 122/62 | HR 64 | Ht 71.0 in | Wt 210.0 lb

## 2022-11-22 DIAGNOSIS — I483 Typical atrial flutter: Secondary | ICD-10-CM

## 2022-11-22 DIAGNOSIS — I422 Other hypertrophic cardiomyopathy: Secondary | ICD-10-CM | POA: Diagnosis not present

## 2022-11-22 DIAGNOSIS — Z86711 Personal history of pulmonary embolism: Secondary | ICD-10-CM | POA: Diagnosis present

## 2022-11-22 DIAGNOSIS — I4892 Unspecified atrial flutter: Secondary | ICD-10-CM

## 2022-11-22 DIAGNOSIS — E039 Hypothyroidism, unspecified: Secondary | ICD-10-CM | POA: Diagnosis not present

## 2022-11-22 DIAGNOSIS — Z950 Presence of cardiac pacemaker: Secondary | ICD-10-CM

## 2022-11-22 DIAGNOSIS — E785 Hyperlipidemia, unspecified: Secondary | ICD-10-CM | POA: Diagnosis not present

## 2022-11-22 NOTE — Patient Instructions (Addendum)
Medication Instructions:  The current medical regimen is effective;  continue present plan and medications.  *If you need a refill on your cardiac medications before your next appointment, please call your pharmacy*   Lab Work: CBC, CMET, LIPID, TSH, FREE T4 today   If you have labs (blood work) drawn today and your tests are completely normal, you will receive your results only by: MyChart Message (if you have MyChart) OR A paper copy in the mail If you have any lab test that is abnormal or we need to change your treatment, we will call you to review the results.   Testing/Procedures: Your physician has requested that you have a Cardioversion.Electrical Cardioversion uses a jolt of electricity to your heart either through paddles or wired patches attached to your chest. This is a controlled, usually prescheduled, procedure. This procedure is done at the hospital and you are not awake during the procedure. You usually go home the day of the procedure. Please see the instruction sheet given to you today for more information.  Echocardiogram - Your physician has requested that you have an echocardiogram. Echocardiography is a painless test that uses sound waves to create images of your heart. It provides your doctor with information about the size and shape of your heart and how well your heart's chambers and valves are working. This procedure takes approximately one hour. There are no restrictions for this procedure.    Follow-Up: At [facility], you and your health needs are our priority.  As part of our continuing mission to provide you with exceptional heart care, we have created designated Provider Care Teams.  These Care Teams include your primary Cardiologist (physician) and Advanced Practice Providers (APPs -  Physician Assistants and Nurse Practitioners) who all work together to provide you with the care you need, when you need it.  We recommend signing up for the patient  portal called "MyChart".  Sign up information is provided on this After Visit Summary.  MyChart is used to connect with patients for Virtual Visits (Telemedicine).  Patients are able to view lab/test results, encounter notes, upcoming appointments, etc.  Non-urgent messages can be sent to your provider as well.   To learn more about what you can do with MyChart, go to [email or web address].    Your next appointment:   3 month(s)  Provider:   Peter Swaziland, MD    We will send a message to get you in to follow up with Dr.Lambert   Other Instructions    Dear Chase Hill  You are scheduled for a Cardioversion on Wednesday, April 24 with Dr. Duke Salvia.  Please arrive at the [facility] (Main Entrance A) at [facility]: [address] [place], [place] [address] at 10:30 AM.   DIET:  Nothing to eat or drink after midnight except a sip of water with medications (see medication instructions below)  MEDICATION INSTRUCTIONS: !!IF ANY NEW MEDICATIONS ARE STARTED AFTER TODAY, PLEASE NOTIFY YOUR PROVIDER AS SOON AS POSSIBLE!!  FYI: Medications such as Semaglutide (Ozempic, [place]), Tirzepatide (Mounjaro, Zepbound), Dulaglutide (Trulicity), etc ("GLP1 agonists") must be held around the time of a procedure. Talk to your provider if you take one of these.  Continue taking your anticoagulant (blood thinner): Apixaban (Eliquis).  You will need to continue this after your procedure until you are told by your provider that it is safe to stop.    LABS: today- (CBC, CMET, LIPID, TSH, Free T4)   FYI:  For your safety, and  to allow [place] to monitor your vital signs accurately during the surgery/procedure we request: If you have artificial nails, gel coating, SNS etc, please have those removed prior to your surgery/procedure. Not having the nail coverings /polish removed may result in cancellation or delay of your surgery/procedure.  You must have a responsible person to drive you home and stay in  the waiting area during your procedure. Failure to do so could result in cancellation.  Bring your insurance cards.  *Special Note: Every effort is made to have your procedure done on time. Occasionally there are emergencies that occur at the hospital that may cause delays. Please be patient if a delay does occur.

## 2022-11-23 LAB — CBC
Hematocrit: 47.9 % (ref 37.5–51.0)
Hemoglobin: 16.1 g/dL (ref 13.0–17.7)
MCH: 31.1 pg (ref 26.6–33.0)
MCHC: 33.6 g/dL (ref 31.5–35.7)
MCV: 93 fL (ref 79–97)
Platelets: 202 10*3/uL (ref 150–450)
RBC: 5.17 x10E6/uL (ref 4.14–5.80)
RDW: 12.7 % (ref 11.6–15.4)
WBC: 6.4 10*3/uL (ref 3.4–10.8)

## 2022-11-23 LAB — COMPREHENSIVE METABOLIC PANEL
ALT: 27 IU/L (ref 0–44)
AST: 28 IU/L (ref 0–40)
Albumin/Globulin Ratio: 1.6 (ref 1.2–2.2)
Albumin: 4.4 g/dL (ref 3.8–4.8)
Alkaline Phosphatase: 82 IU/L (ref 44–121)
BUN/Creatinine Ratio: 13 (ref 10–24)
BUN: 16 mg/dL (ref 8–27)
Bilirubin Total: 0.6 mg/dL (ref 0.0–1.2)
CO2: 20 mmol/L (ref 20–29)
Calcium: 9.5 mg/dL (ref 8.6–10.2)
Chloride: 101 mmol/L (ref 96–106)
Creatinine, Ser: 1.24 mg/dL (ref 0.76–1.27)
Globulin, Total: 2.8 g/dL (ref 1.5–4.5)
Glucose: 89 mg/dL (ref 70–99)
Potassium: 4.6 mmol/L (ref 3.5–5.2)
Sodium: 140 mmol/L (ref 134–144)
Total Protein: 7.2 g/dL (ref 6.0–8.5)
eGFR: 60 mL/min/{1.73_m2} (ref >59)

## 2022-11-23 LAB — LIPID PANEL
Chol/HDL Ratio: 3.5 ratio (ref 0.0–5.0)
Cholesterol, Total: 160 mg/dL (ref 100–199)
HDL: 46 mg/dL (ref >39)
LDL Chol Calc (NIH): 69 mg/dL (ref 0–99)
Triglycerides: 279 mg/dL — ABNORMAL HIGH (ref 0–149)
VLDL Cholesterol Cal: 45 mg/dL — ABNORMAL HIGH (ref 5–40)

## 2022-11-23 LAB — TSH: TSH: 0.605 u[IU]/mL (ref 0.450–4.500)

## 2022-11-23 LAB — T4, FREE: Free T4: 1.56 ng/dL (ref 0.82–1.77)

## 2022-11-24 ENCOUNTER — Ambulatory Visit (HOSPITAL_COMMUNITY): Payer: Medicare Other | Attending: Cardiology

## 2022-11-24 DIAGNOSIS — I4892 Unspecified atrial flutter: Secondary | ICD-10-CM | POA: Insufficient documentation

## 2022-11-24 LAB — ECHOCARDIOGRAM COMPLETE
Area-P 1/2: 3.43 cm2
P 1/2 time: 581 msec
S' Lateral: 3 cm

## 2022-11-24 NOTE — Addendum Note (Signed)
Addended by: Jethro Bolus A on: 11/24/2022 10:34 AM   Modules accepted: Orders

## 2022-11-28 ENCOUNTER — Telehealth: Payer: Self-pay | Admitting: Cardiology

## 2022-11-28 NOTE — Telephone Encounter (Signed)
Patient is returning call regarding his Echo results.

## 2022-11-28 NOTE — Telephone Encounter (Signed)
Spoke to patient echo results given. 

## 2022-12-05 NOTE — Anesthesia Preprocedure Evaluation (Signed)
Anesthesia Evaluation  Patient identified by MRN, date of birth, ID band Patient awake    Reviewed: Allergy & Precautions, NPO status , Patient's Chart, lab work & pertinent test results  History of Anesthesia Complications Negative for: history of anesthetic complications  Airway Mallampati: II  TM Distance: >3 FB Neck ROM: Full    Dental  (+) Chipped   Pulmonary PE   breath sounds clear to auscultation       Cardiovascular (-) hypertension(-) angina + DVT  + dysrhythmias Atrial Fibrillation + pacemaker  Rhythm:Irregular Rate:Tachycardia  11/24/2022 ECHO: EF 60-65%, normal LVF, mod LVH with no significant outflow gradient, normal RVF, mild MR, mild-mod AI, mild dilation of aortic root 44mm, asc aorta 46mm   Neuro/Psych negative neurological ROS     GI/Hepatic Neg liver ROS,GERD  Controlled,,  Endo/Other  Hypothyroidism    Renal/GU negative Renal ROS     Musculoskeletal   Abdominal   Peds  Hematology eliquis   Anesthesia Other Findings   Reproductive/Obstetrics                             Anesthesia Physical Anesthesia Plan  ASA: 3  Anesthesia Plan: General   Post-op Pain Management: Minimal or no pain anticipated   Induction:   PONV Risk Score and Plan: 2 and Treatment may vary due to age or medical condition  Airway Management Planned: Natural Airway and Mask  Additional Equipment: None  Intra-op Plan:   Post-operative Plan:   Informed Consent: I have reviewed the patients History and Physical, chart, labs and discussed the procedure including the risks, benefits and alternatives for the proposed anesthesia with the patient or authorized representative who has indicated his/her understanding and acceptance.     Dental advisory given  Plan Discussed with: CRNA and Surgeon  Anesthesia Plan Comments:        Anesthesia Quick Evaluation

## 2022-12-05 NOTE — Pre-Procedure Instructions (Signed)
Spoke with patient procedure time moved up.  New arrival time is 7:45.  Nothing to eat or drink after midnight.  Taken Eliquis twice a day, hasn't missed any doses.  Will take dose in the morning.  You need a responsible adult to drive you home after procedure and stay with you for 24 hours.

## 2022-12-06 ENCOUNTER — Encounter (HOSPITAL_COMMUNITY)
Admission: RE | Disposition: A | Discharge status: Home / Self Care | Payer: Self-pay | Source: Home / Self Care | Attending: Cardiovascular Disease

## 2022-12-06 ENCOUNTER — Encounter (HOSPITAL_COMMUNITY): Payer: Self-pay | Admitting: Cardiovascular Disease

## 2022-12-06 ENCOUNTER — Other Ambulatory Visit: Payer: Self-pay

## 2022-12-06 ENCOUNTER — Ambulatory Visit (HOSPITAL_COMMUNITY)
Admission: RE | Admit: 2022-12-06 | Discharge: 2022-12-06 | Disposition: A | Discharge status: Home / Self Care | Payer: Medicare Other | Attending: Cardiovascular Disease | Admitting: Cardiovascular Disease

## 2022-12-06 ENCOUNTER — Ambulatory Visit (HOSPITAL_BASED_OUTPATIENT_CLINIC_OR_DEPARTMENT_OTHER): Payer: Medicare Other | Admitting: Anesthesiology

## 2022-12-06 ENCOUNTER — Ambulatory Visit (HOSPITAL_COMMUNITY): Payer: Medicare Other | Admitting: Anesthesiology

## 2022-12-06 DIAGNOSIS — I2699 Other pulmonary embolism without acute cor pulmonale: Secondary | ICD-10-CM

## 2022-12-06 DIAGNOSIS — I712 Thoracic aortic aneurysm, without rupture, unspecified: Secondary | ICD-10-CM | POA: Insufficient documentation

## 2022-12-06 DIAGNOSIS — Z7901 Long term (current) use of anticoagulants: Secondary | ICD-10-CM | POA: Insufficient documentation

## 2022-12-06 DIAGNOSIS — I421 Obstructive hypertrophic cardiomyopathy: Secondary | ICD-10-CM | POA: Insufficient documentation

## 2022-12-06 DIAGNOSIS — Z79890 Hormone replacement therapy (postmenopausal): Secondary | ICD-10-CM | POA: Insufficient documentation

## 2022-12-06 DIAGNOSIS — Z950 Presence of cardiac pacemaker: Secondary | ICD-10-CM | POA: Insufficient documentation

## 2022-12-06 DIAGNOSIS — I4892 Unspecified atrial flutter: Secondary | ICD-10-CM | POA: Diagnosis present

## 2022-12-06 DIAGNOSIS — E039 Hypothyroidism, unspecified: Secondary | ICD-10-CM | POA: Insufficient documentation

## 2022-12-06 DIAGNOSIS — Z86711 Personal history of pulmonary embolism: Secondary | ICD-10-CM | POA: Diagnosis not present

## 2022-12-06 DIAGNOSIS — E7800 Pure hypercholesterolemia, unspecified: Secondary | ICD-10-CM | POA: Diagnosis not present

## 2022-12-06 DIAGNOSIS — I483 Typical atrial flutter: Secondary | ICD-10-CM

## 2022-12-06 HISTORY — PX: CARDIOVERSION: SHX1299

## 2022-12-06 SURGERY — CARDIOVERSION
Anesthesia: General

## 2022-12-06 MED ORDER — LIDOCAINE 2% (20 MG/ML) 5 ML SYRINGE
INTRAMUSCULAR | Status: DC | PRN
  Administered 2022-12-06: 30 mg via INTRAVENOUS

## 2022-12-06 MED ORDER — SODIUM CHLORIDE 0.9 % IV SOLN: INTRAVENOUS | Status: DC

## 2022-12-06 MED ORDER — PROPOFOL 10 MG/ML IV BOLUS
INTRAVENOUS | Status: DC | PRN
  Administered 2022-12-06: 70 mg via INTRAVENOUS
  Administered 2022-12-06: 10 mg via INTRAVENOUS

## 2022-12-06 SURGICAL SUPPLY — 1 items: ELECT DEFIB PAD ADLT CADENCE (PAD) ×1 IMPLANT

## 2022-12-06 NOTE — Transfer of Care (Signed)
Immediate Anesthesia Transfer of Care Note  Patient: Chase Hill  Procedure(s) Performed: CARDIOVERSION  Patient Location: PACU and Cath Lab  Anesthesia Type:General  Level of Consciousness: patient cooperative and responds to stimulation  Airway & Oxygen Therapy: Patient Spontanous Breathing  Post-op Assessment: Report given to RN and Post -op Vital signs reviewed and stable  Post vital signs: Reviewed and stable  Last Vitals:  Vitals Value Taken Time  BP 136/84 12/06/22 0912  Temp    Pulse 67 12/06/22 0913  Resp 18 12/06/22 0913  SpO2 97 % 12/06/22 0913  Vitals shown include unvalidated device data.  Last Pain:  Vitals:   12/06/22 0804  TempSrc: Temporal  PainSc: 0-No pain         Complications: No notable events documented.

## 2022-12-06 NOTE — CV Procedure (Signed)
Electrical Cardioversion Procedure Note Chase Hill [id number] [birth year]  Procedure: Electrical Cardioversion Indications:  Atrial Flutter  Procedure Details Consent: Risks of procedure as well as the alternatives and risks of each were explained to the (patient/caregiver).  Consent for procedure obtained. Time Out: Verified patient identification, verified procedure, site/side was marked, verified correct patient position, special equipment/implants available, medications/allergies/relevent history reviewed, required imaging and test results available.  Performed  Patient placed on cardiac monitor, pulse oximetry, supplemental oxygen as necessary.  Sedation given:  propofol Pacer pads placed anterior and posterior chest.  Cardioverted 1 time(s).  Cardioverted at 200J.  Evaluation Findings: Post procedure EKG shows:  APVP Complications: None Patient did tolerate procedure well.   Chilton Si, MD 12/06/2022, 9:13 AM

## 2022-12-06 NOTE — Interval H&P Note (Signed)
History and Physical Interval Note:  12/06/2022 8:54 AM  Chase Hill  has presented today for surgery, with the diagnosis of A FLUTTER.  The various methods of treatment have been discussed with the patient and family. After consideration of risks, benefits and other options for treatment, the patient has consented to  Procedure(s): CARDIOVERSION (N/A) as a surgical intervention.  The patient's history has been reviewed, patient examined, no change in status, stable for surgery.  I have reviewed the patient's chart and labs.  Questions were answered to the patient's satisfaction.     Chilton Si, MD

## 2022-12-06 NOTE — Discharge Instructions (Signed)

## 2022-12-06 NOTE — Anesthesia Postprocedure Evaluation (Signed)
Anesthesia Post Note  Patient: TULLIO CHAUSSE  Procedure(s) Performed: CARDIOVERSION     Patient location during evaluation: Cath Lab Anesthesia Type: General Level of consciousness: awake and alert, patient cooperative and oriented Pain management: pain level controlled Vital Signs Assessment: post-procedure vital signs reviewed and stable Respiratory status: spontaneous breathing, nonlabored ventilation and respiratory function stable Cardiovascular status: blood pressure returned to baseline and stable Postop Assessment: no apparent nausea or vomiting and able to ambulate Anesthetic complications: no   No notable events documented.  Last Vitals:  Vitals:   12/06/22 0915 12/06/22 0925  BP: (!) 129/94 131/84  Pulse: 69 66  Resp: (!) 21 15  Temp: 36.7 C   SpO2: 93% 95%    Last Pain:  Vitals:   12/06/22 0925  TempSrc:   PainSc: 0-No pain                 Nygel Prokop,E. Jacori Mulrooney

## 2022-12-20 ENCOUNTER — Ambulatory Visit (INDEPENDENT_AMBULATORY_CARE_PROVIDER_SITE_OTHER): Payer: Medicare Other

## 2022-12-20 DIAGNOSIS — I422 Other hypertrophic cardiomyopathy: Secondary | ICD-10-CM

## 2022-12-20 LAB — CUP PACEART REMOTE DEVICE CHECK
Battery Remaining Longevity: 112 mo
Battery Voltage: 3.01 V
Brady Statistic AP VP Percent: 54.68 %
Brady Statistic AP VS Percent: 0.42 %
Brady Statistic AS VP Percent: 40.23 %
Brady Statistic AS VS Percent: 4.67 %
Brady Statistic RA Percent Paced: 56.85 %
Brady Statistic RV Percent Paced: 94.91 %
Date Time Interrogation Session: 20240508043521
Implantable Lead Connection Status: 753985
Implantable Lead Connection Status: 753985
Implantable Lead Implant Date: 20210604
Implantable Lead Implant Date: 20210604
Implantable Lead Location: 753859
Implantable Lead Location: 753860
Implantable Lead Model: 4076
Implantable Lead Model: 4076
Implantable Pulse Generator Implant Date: 20210604
Lead Channel Impedance Value: 266 Ohm
Lead Channel Impedance Value: 380 Ohm
Lead Channel Impedance Value: 380 Ohm
Lead Channel Impedance Value: 456 Ohm
Lead Channel Pacing Threshold Amplitude: 0.5 V
Lead Channel Pacing Threshold Amplitude: 0.75 V
Lead Channel Pacing Threshold Pulse Width: 0.4 ms
Lead Channel Pacing Threshold Pulse Width: 0.4 ms
Lead Channel Sensing Intrinsic Amplitude: 14.375 mV
Lead Channel Sensing Intrinsic Amplitude: 14.375 mV
Lead Channel Sensing Intrinsic Amplitude: 5.75 mV
Lead Channel Sensing Intrinsic Amplitude: 5.75 mV
Lead Channel Setting Pacing Amplitude: 1.5 V
Lead Channel Setting Pacing Amplitude: 2 V
Lead Channel Setting Pacing Pulse Width: 0.4 ms
Lead Channel Setting Sensing Sensitivity: 0.9 mV
Zone Setting Status: 755011
Zone Setting Status: 755011

## 2023-01-10 NOTE — Progress Notes (Signed)
Remote pacemaker transmission.   

## 2023-01-24 ENCOUNTER — Ambulatory Visit: Payer: Medicare Other | Attending: Cardiology | Admitting: Cardiology

## 2023-01-24 ENCOUNTER — Encounter: Payer: Self-pay | Admitting: Cardiology

## 2023-01-24 VITALS — BP 140/82 | HR 63 | Ht 71.0 in | Wt 215.4 lb

## 2023-01-24 DIAGNOSIS — I4819 Other persistent atrial fibrillation: Secondary | ICD-10-CM | POA: Diagnosis present

## 2023-01-24 DIAGNOSIS — Z950 Presence of cardiac pacemaker: Secondary | ICD-10-CM

## 2023-01-24 DIAGNOSIS — I483 Typical atrial flutter: Secondary | ICD-10-CM | POA: Diagnosis present

## 2023-01-24 DIAGNOSIS — I442 Atrioventricular block, complete: Secondary | ICD-10-CM

## 2023-01-24 NOTE — Progress Notes (Signed)
Electrophysiology Office Follow up Visit Note:    Date:  01/24/2023   ID:  Chase Hill, DOB [birth year], MRN [id number]  PCP:  Chase Fillers, MD  [facility] Cardiologist:  Chase Swaziland, MD  [facility] Electrophysiologist:  Chase Prude, MD    Interval History:    Chase Hill is a 77 y.o. male who presents for a follow up visit. At his last appointment 01/2021 he was doing well with no recurrence of atrial fibrillation.  He was seen by Dr. Swaziland 11/22/2022. About 6 weeks prior he developed increased fatigue, shortness of breath, and was exhausted with walking up a flight of stairs. His EKG showed atrial flutter with V pacing, rate 64. First recurrence since 2021. He underwent DCCV 12/06/2022 and was recommended for EP follow-up to consider ablation. Intolerant of amiodarone in the past.  Today, he is accompanied by his wife. He states he is feeling okay, although he is usually not able to tell that he is in Afib beyond feeling more fatigued. His wife will notice that he becomes more short of breath and is somewhat more lethargic, in a daze.   He confirms that he did feel better after his cardioversion for about a month. He then recognized that he "didn't feel quite as good." Generally he is able to walk, and will breathe a little harder when reaching the top of a hill.  He denies any palpitations, chest pain, peripheral edema, lightheadedness, headaches, syncope, orthopnea, or PND.     Past medical, surgical, social, and family history were reviewed.  ROS:   Please see the history of present illness.    (+) Shortness of breath (+) Fatigue, lethargic All other systems reviewed and are negative.  EKGs/Labs/Other Studies Reviewed:    The following studies were reviewed today:  01/24/2023 In clinic device interrogation personally reviewed: Battery longevity 9.2 years Lead parameter stable A-fib burden has been persistent since mid May.  He was free of  atrial fibrillation for about 1 month after his cardioversion.  EKG:  EKG is personally reviewed.  01/24/2023: Atrial flutter with  Physical Exam:    VS:  BP (!) 140/82   Pulse 63   Ht 5\' 11"  (1.803 m)   Wt 215 lb 6.4 oz (97.7 kg)   SpO2 97%   BMI 30.04 kg/m     Wt Readings from Last 3 Encounters:  01/24/23 215 lb 6.4 oz (97.7 kg)  12/06/22 205 lb (93 kg)  11/22/22 210 lb (95.3 kg)     GEN: Well nourished, well developed in no acute distress CARDIAC: Irregularly irregular, no murmurs, rubs, gallops. PPM pocket well healed. RESPIRATORY:  Clear to auscultation without rales, wheezing or rhonchi       ASSESSMENT:    1. Typical atrial flutter (HCC)   2. Persistent atrial fibrillation (HCC)   3. Complete heart block (HCC)   4. Pacemaker    PLAN:     # Persistent atrial fibrillation On Eliquis for stroke prophylaxis.  Symptomatic recurrence.  Maintain sinus rhythm for about 1 month after his cardioversion.  We discussed treatment options during today's clinic appointment.  He previously was on amiodarone and did not tolerate.  We discussed catheter ablation in detail during today's appointment.  We discussed the risks, recovery and likelihood of success.  He would like to proceed with scheduling.  Discussed treatment options today for AF including antiarrhythmic drug therapy and ablation. Discussed risks, recovery and likelihood of success with each treatment strategy.  Risk, benefits, and alternatives to EP study and radiofrequency ablation for afib were discussed. These risks include but are not limited to stroke, bleeding, vascular damage, tamponade, perforation, damage to the esophagus, lungs, phrenic nerve and other structures, pulmonary vein stenosis, worsening renal function, and death.  Discussed potential need for repeat ablation procedures and antiarrhythmic drugs after an initial ablation. The patient understands these risk and wishes to proceed.  We will therefore proceed  with catheter ablation at the next available time.  Carto, ICE, anesthesia are requested for the procedure.  Will also obtain CT PV protocol prior to the procedure to exclude LAA thrombus and further evaluate atrial anatomy.   Ablation strategy will be PVI plus posterior wall plus CTI   #HOCM Doing well. No obstructive symptoms today.  #Complete heart block post permanent pacemaker implant Pacemaker functioning well. Continue remote interrogations.   I,Mathew Stumpf,acting as a Neurosurgeon for Chase Prude, MD.,have documented all relevant documentation on the behalf of Chase Prude, MD,as directed by  Chase Prude, MD while in the presence of Chase Prude, MD.  I, Chase Prude, MD, have reviewed all documentation for this visit. The documentation on 01/24/23 for the exam, diagnosis, procedures, and orders are all accurate and complete.   Signed, Steffanie Dunn, MD, [facility], [facility] 01/24/2023 10:13 PM    Electrophysiology [facility]

## 2023-01-24 NOTE — Patient Instructions (Signed)
Medication Instructions:  Your physician recommends that you continue on your current medications as directed. Please refer to the Current Medication list given to you today.  *If you need a refill on your cardiac medications before your next appointment, please call your pharmacy*  Lab Work: BMET and CBC on 05/28/23 at [address], [address] anytime between 7:30am and 4:30pm   Testing/Procedures: Your physician has requested that you have cardiac CT. Cardiac computed tomography (CT) is a painless test that uses an x-ray machine to take clear, detailed pictures of your heart. For further information please visit [email or web address]  Your CT scan will be done one week prior to your ablation. We will call you to schedule.   Your physician has recommended that you have an ablation. Catheter ablation is a medical procedure used to treat some cardiac arrhythmias (irregular heartbeats). During catheter ablation, a long, thin, flexible tube is put into a blood vessel in your groin (upper thigh), or neck. This tube is called an ablation catheter. It is then guided to your heart through the blood vessel. Radio frequency waves destroy small areas of heart tissue where abnormal heartbeats may cause an arrhythmia to start.  You are scheduled for Atrial Fibrillation Ablation on Monday, October 28 with Dr. Steffanie Dunn.Please arrive at the Main Entrance A at [facility]: [address] [place], [place] [address] at 5:30 AM    Follow-Up: At [facility], you and your health needs are our priority.  As part of our continuing mission to provide you with exceptional heart care, we have created designated Provider Care Teams.  These Care Teams include your primary Cardiologist (physician) and Advanced Practice Providers (APPs -  Physician Assistants and Nurse Practitioners) who all work together to provide you with the care you need, when you need it.  Your next appointment:   We will  call you to arrange your follow up appointments.

## 2023-02-06 ENCOUNTER — Ambulatory Visit (INDEPENDENT_AMBULATORY_CARE_PROVIDER_SITE_OTHER): Payer: Medicare Other | Admitting: Neurology

## 2023-02-06 ENCOUNTER — Encounter: Payer: Self-pay | Admitting: Neurology

## 2023-02-06 VITALS — BP 110/73 | HR 67 | Ht 71.0 in | Wt 211.6 lb

## 2023-02-06 DIAGNOSIS — I4811 Longstanding persistent atrial fibrillation: Secondary | ICD-10-CM | POA: Diagnosis not present

## 2023-02-06 DIAGNOSIS — I421 Obstructive hypertrophic cardiomyopathy: Secondary | ICD-10-CM

## 2023-02-06 DIAGNOSIS — R351 Nocturia: Secondary | ICD-10-CM | POA: Diagnosis not present

## 2023-02-06 DIAGNOSIS — E663 Overweight: Secondary | ICD-10-CM

## 2023-02-06 DIAGNOSIS — G4700 Insomnia, unspecified: Secondary | ICD-10-CM

## 2023-02-06 NOTE — Patient Instructions (Signed)

## 2023-02-06 NOTE — Progress Notes (Signed)
Subjective:    Patient ID: Chase Hill is a 77 y.o. male.  HPI    Huston Foley, MD, PhD [facility] [address] [address] [place], [place] [address]  Dear Dr. Eloise Harman,  I saw your patient, Chase Hill, upon your kind request in my sleep clinic today for initial consultation of his sleep disorder, in particular, concern for underlying obstructive sleep apnea.  The patient is unaccompanied today.  As you know, [name] is a 77 year old male with an underlying complex medical history of atrial flutter, hypertrophic cardiomyopathy, complete heart block, paroxysmal A-fib with status post cardioversion in August 2021 as well as April 2024, and pending ablation in October 2024, status post pacemaker placement in June 2021, history of DVT and PE, cervical radiculopathy, allergic rhinitis, diverticulosis, reflux disease, hypothyroidism, hyperlipidemia, and overweight state, who reports snoring and difficulty sleeping at night.  He has particularly difficulty maintaining sleep.  This is a chronic problem.  His Epworth sleepiness score is 24, fatigue severity score is 34 out of 63.  I reviewed your office note from 12/18/2022.  He has tried melatonin at 3 mg and 5 mg and it was not very helpful, he is no longer taking it.  He goes to bed around 10 and rise time is around 7 or 8.  He works as an Pensions consultant.  He has nocturia about once per average night and denies recurrent nocturnal or morning headaches.  He lives with his wife, they typically do not sleep in the same bedroom.  She has mentioned snoring to him in the past.  She has not mentioned any apneas to him.  They have grown children, no pets in the household, he does have a TV in the bedroom and has it on at night but tends to turn it off around 10 PM.  He drinks caffeine in the form of tea, 3 to 4 cups/day, no later than lunch typically or a little after lunch.  He is a non-smoker.  He drinks alcohol  occasionally, maybe once a week.  He has no family history of sleep apnea.  He does not believe he has sleep apnea and he is reluctant to consider a CPAP machine.  His Past Medical History Is Significant For: Past Medical History:  Diagnosis Date   ALLERGIC RHINITIS 08/16/2009   Qualifier: Diagnosis of  By: Alwyn Ren MD, Chrissie Noa   Onset:as a child Character: perennial but increased seasonally Triggers: dust mites; minor as pollen Allergy testing: yes X 2; as child & in 1995, Dr Jethro Bolus. Never took shots Maintenance medications/ response:Loratidine daily Smoking history:never Family history pulmonary disease: no      Cervical radiculopathy at C7    PMH of   Chest pain 11/22/2012   Evaluated by Dr Marca Ancona, Cardiology 2014 ECHO, MR : ? hypertrophic cardiomyopathy Rx: Beta blocker    Diverticulosis    colonoscopy 10-15-2006   DIVERTICULOSIS, COLON 08/16/2009   Qualifier: Diagnosis of  By: Alwyn Ren MD, Chrissie Noa   Dr Jarold Motto, GI     DVT (deep venous thrombosis) (HCC)    ELECTROCARDIOGRAM, ABNORMAL 08/16/2009   Qualifier: Diagnosis of  By: Alwyn Ren MD, William   Right bundle branch block, left posterior fascicular block( bifascicular block), not present 01/17/2007; noted 08/16/2009     GERD (gastroesophageal reflux disease)    Sullivan Lone syndrome    GILBERT'S SYNDROME 01/14/2007   Qualifier: Diagnosis of  By: Alwyn Ren MD, Chrissie Noa     Hemorrhoids    Hyperlipidemia  Hypothyroidism    Obstructive hypertrophic cardiomyopathy (HCC) 01/15/2013   Perennial allergic rhinitis with seasonal variation    Pulmonary embolism (HCC)    REFLUX, ESOPHAGEAL 01/14/2007   Qualifier: Diagnosis of  By: Alwyn Ren MD, William     Thoracic aortic aneurysm without rupture (HCC) 03/20/2017   THYROID FUNCTION TEST, ABNORMAL 09/07/2010   Qualifier: Diagnosis of  By: Alwyn Ren MD, Chrissie Noa      Varicose veins of right lower extremity with complications 02/04/2018    His Past Surgical History Is Significant For: Past Surgical History:   Procedure Laterality Date   CARDIOVERSION N/A 04/12/2020   Procedure: CARDIOVERSION;  Surgeon: Jodelle Red, MD;  Location: [facility] ENDOSCOPY;  Service: Cardiovascular;  Laterality: N/A;   CARDIOVERSION N/A 12/06/2022   Procedure: CARDIOVERSION;  Surgeon: Chilton Si, MD;  Location: [facility] INVASIVE CV LAB;  Service: Cardiovascular;  Laterality: N/A;   COLONOSCOPY  2010   Tics   ENDOVENOUS ABLATION SAPHENOUS VEIN W/ LASER Right 08/21/2018   endovenous laser ablation right greater saphenous vein and stab phlebectomy > 20 incisions right leg by Fabienne Bruns MD    RIGHT/LEFT HEART CATH AND CORONARY ANGIOGRAPHY N/A 06/26/2019   Procedure: RIGHT/LEFT HEART CATH AND CORONARY ANGIOGRAPHY;  Surgeon: [place], Peter M, MD;  Location: [facility] INVASIVE CV LAB;  Service: Cardiovascular;  Laterality: N/A;   WISDOM TOOTH EXTRACTION      His Family History Is Significant For: Family History  Problem Relation Age of Onset   Breast cancer Mother    Coronary artery disease Father    Congestive Heart Failure Father    Coronary artery disease Paternal Aunt    Stroke Maternal Grandmother 28   Diabetes Neg Hx    Hypertension Neg Hx    Colon cancer Neg Hx    Sleep apnea Neg Hx     His Social History Is Significant For: Social History   Socioeconomic History   Marital status: Married    Spouse name: Not on file   Number of children: 2   Years of education: Not on file   Highest education level: Not on file  Occupational History   Occupation: LAWYER  Tobacco Use   Smoking status: Never   Smokeless tobacco: Never  Vaping Use   Vaping Use: Never used  Substance and Sexual Activity   Alcohol use: Yes    Alcohol/week: 1.0 standard drink of alcohol    Types: 1 Glasses of wine per week    Comment:  < 3 /week , only socially   Drug use: No   Sexual activity: Not on file  Other Topics Concern   Not on file  Social History Narrative   Daily caffeine    Social Determinants of Health    Financial Resource Strain: Not on file  Food Insecurity: Not on file  Transportation Needs: Not on file  Physical Activity: Not on file  Stress: Not on file  Social Connections: Not on file    His Allergies Are:  Allergies  Allergen Reactions   Heparin     Pt unsure of reaction   :   His Current Medications Are:  Outpatient Encounter Medications as of 02/06/2023  Medication Sig   ELIQUIS 5 MG TABS tablet Take 5 mg by mouth 2 (two) times daily.   levothyroxine (SYNTHROID) 150 MCG tablet Take 150 mcg by mouth daily before breakfast.   loratadine (CLARITIN) 10 MG tablet Take 10 mg by mouth daily after breakfast.    Melatonin 5 MG CAPS Take 5 mg  by mouth at bedtime as needed (sleep).   Multiple Vitamin (MULTIVITAMIN WITH MINERALS) TABS tablet Take 1 tablet by mouth every evening.   rosuvastatin (CRESTOR) 5 MG tablet Take 5 mg by mouth every Monday, Wednesday, and Friday.   No facility-administered encounter medications on file as of 02/06/2023.  :   Review of Systems:  Out of a complete 14 point review of systems, all are reviewed and negative with the exception of these symptoms as listed below:  Review of Systems  Neurological:        Pt here for sleep consult Pt has fatigue,AFIB, Pt denies headaches,snoring ,sleep study,CPAP machine    ESS:2 FSS:34    Objective:  Neurological Exam  Physical Exam Physical Examination:   Vitals:   02/06/23 0902  BP: 110/73  Pulse: 67    General Examination: The patient is a very pleasant 77 y.o. male in no acute distress. He appears well-developed and well-nourished and well groomed.   HEENT: Normocephalic, atraumatic, pupils are equal, round and reactive to light, extraocular tracking is good without limitation to gaze excursion or nystagmus noted.  Corrective eyeglasses in place.  Hearing is grossly intact. Face is symmetric with normal facial animation. Speech is clear with no dysarthria noted. There is no hypophonia. There is  no lip, neck/head, jaw or voice tremor. Neck is supple with full range of passive and active motion. There are no carotid bruits on auscultation. Oropharynx exam reveals: mild mouth dryness, adequate dental hygiene and moderate airway crowding, due to redundant soft palate, wider uvula, Mallampati class III, tonsils on the smaller side.  Tongue protrudes centrally and palate elevates symmetrically.  Neck circumference 17-1/8 inches, no significant overbite, slight crossbite.  Chest: Clear to auscultation without wheezing, rhonchi or crackles noted.  Heart: S1+S2+0, regular and normal without murmurs, rubs or gallops noted.   Abdomen: Soft, non-tender and non-distended.  Extremities: There is no pitting edema in the distal lower extremities bilaterally.  Compression socks up to the knees bilaterally.  Skin: Warm and dry without trophic changes noted.   Musculoskeletal: exam reveals no obvious joint deformities.   Neurologically:  Mental status: The patient is awake, alert and oriented in all 4 spheres. His immediate and remote memory, attention, language skills and fund of knowledge are appropriate. There is no evidence of aphasia, agnosia, apraxia or anomia. Speech is clear with normal prosody and enunciation. Thought process is linear. Mood is normal and affect is normal.  Cranial nerves II - XII are as described above under HEENT exam.  Motor exam: Normal bulk, strength and tone is noted. There is no obvious action or resting tremor.  Fine motor skills and coordination: grossly intact.  Cerebellar testing: No dysmetria or intention tremor. There is no truncal or gait ataxia.  Sensory exam: intact to light touch in the upper and lower extremities.  Gait, station and balance: He stands easily. No veering to one side is noted. No leaning to one side is noted. Posture is age-appropriate and stance is narrow based. Gait shows normal stride length and normal pace. No problems turning are noted.    Assessment and Plan:  In summary, ANDRICK RUST is a very pleasant 77 y.o.-year old male with an underlying complex medical history of atrial flutter, hypertrophic cardiomyopathy, complete heart block, paroxysmal A-fib with status post cardioversion in August 2021 as well as April 2024, and pending ablation in October 2024, status post pacemaker placement in June 2021, history of DVT and PE, cervical radiculopathy, allergic  rhinitis, diverticulosis, reflux disease, hypothyroidism, hyperlipidemia, and overweight state, whose history and physical exam are concerning for sleep disordered breathing, particularly obstructive sleep apnea (OSA).  While a low amatory attended sleep study is typically considered "gold standard" for evaluation of sleep disordered breathing, he would prefer a home sleep test at this time. He is advised to try melatonin again, he is advised to take up to 10 mg 1 or 2 hours before bedtime.  I would not recommend he take melatonin in the middle of the night when he wakes up around 3 AM. I had a long chat with the patient about my findings and the diagnosis of sleep apnea, particularly OSA, its prognosis and treatment options. We talked about medical/conservative treatments, surgical interventions and non-pharmacological approaches for symptom control. I explained, in particular, the risks and ramifications of untreated moderate to severe OSA, especially with respect to developing cardiovascular disease down the road, including congestive heart failure (CHF), difficult to treat hypertension, cardiac arrhythmias (particularly A-fib), neurovascular complications including TIA, stroke and dementia. Even type 2 diabetes has, in part, been linked to untreated OSA. Symptoms of untreated OSA may include (but may not be limited to) daytime sleepiness, nocturia (i.e. frequent nighttime urination), memory problems, mood irritability and suboptimally controlled or worsening mood disorder such as  depression and/or anxiety, lack of energy, lack of motivation, physical discomfort, as well as recurrent headaches, especially morning or nocturnal headaches. We talked about the importance of striving for and maintaining good sleep hygiene.  He is advised not to have any caffeine after 3 PM.  He is advised not to watch TV in his bedroom. I recommended a sleep study at this time. I outlined the differences between a laboratory attended sleep study which is considered more comprehensive and accurate over the option of a home sleep test (HST); the latter may lead to underestimation of sleep disordered breathing in some instances and does not help with diagnosing upper airway resistance syndrome and is not accurate enough to diagnose primary central sleep apnea typically. I outlined possible surgical and non-surgical treatment options of OSA, including the use of a positive airway pressure (PAP) device (i.e. CPAP, AutoPAP/APAP or BiPAP in certain circumstances), a custom-made dental device (aka oral appliance, which would require a referral to a specialist dentist or orthodontist typically, and is generally speaking not considered for patients with full dentures or edentulous state), upper airway surgical options, such as traditional UPPP (which is not considered a first-line treatment) or the Inspire device (hypoglossal nerve stimulator, which would involve a referral for consultation with an ENT surgeon, after careful selection, following inclusion criteria - also not first-line treatment). I explained the PAP treatment option to the patient in detail, as this is generally considered first-line treatment.  The patient indicated that he would be reluctant to try PAP therapy. We will pick up our discussion about the next steps and treatment options after testing.  We will keep him posted as to the test results by phone call and/or MyChart messaging where possible.  We will plan to follow-up in sleep clinic  accordingly as well.  I answered all his questions today and the patient was in agreement.   I encouraged him to call with any interim questions, concerns, problems or updates or email [place] through MyChart.  Generally speaking, sleep test authorizations may take up to 2 weeks, sometimes less, sometimes longer, the patient is encouraged to get in touch with [place] if they do not hear back from the sleep  lab staff directly within the next 2 weeks.  Thank you very much for allowing me to participate in the care of this nice patient. If I can be of any further assistance to you please do not hesitate to call me at [phone].  Sincerely,   Huston Foley, MD, PhD

## 2023-02-21 ENCOUNTER — Encounter: Payer: Medicare Other | Admitting: Cardiology

## 2023-03-06 ENCOUNTER — Ambulatory Visit (INDEPENDENT_AMBULATORY_CARE_PROVIDER_SITE_OTHER): Payer: Medicare Other | Admitting: Neurology

## 2023-03-06 DIAGNOSIS — R0683 Snoring: Secondary | ICD-10-CM

## 2023-03-06 DIAGNOSIS — E663 Overweight: Secondary | ICD-10-CM

## 2023-03-06 DIAGNOSIS — G4733 Obstructive sleep apnea (adult) (pediatric): Secondary | ICD-10-CM | POA: Diagnosis not present

## 2023-03-06 DIAGNOSIS — I4811 Longstanding persistent atrial fibrillation: Secondary | ICD-10-CM

## 2023-03-06 DIAGNOSIS — I421 Obstructive hypertrophic cardiomyopathy: Secondary | ICD-10-CM

## 2023-03-06 DIAGNOSIS — G4700 Insomnia, unspecified: Secondary | ICD-10-CM

## 2023-03-06 DIAGNOSIS — R351 Nocturia: Secondary | ICD-10-CM

## 2023-03-07 NOTE — Progress Notes (Unsigned)
See procedure note.

## 2023-03-08 ENCOUNTER — Telehealth: Payer: Self-pay | Admitting: *Deleted

## 2023-03-08 ENCOUNTER — Telehealth: Payer: Self-pay

## 2023-03-08 NOTE — Telephone Encounter (Signed)
Addressed in other TE 03/08/23

## 2023-03-08 NOTE — Telephone Encounter (Signed)
Spoke to patient gave sleep study results  Gave Dr Johny Sax recommendation pt declined oral device Pt expressed understanding and thanked ne for calling

## 2023-03-08 NOTE — Telephone Encounter (Signed)
-----   Message from Huston Foley sent at 03/08/2023 12:20 PM EDT ----- Patient referred by PCP, seen by me on 02/06/2023, HST on 03/06/2023.   Please call and notify the patient that the recent home sleep test did not show any significant obstructive sleep apnea. Intermittent snoring was detected.  Treatment with a CPAP or similar device called AutoPap is not indicated based on this test.  If he has disturbing snoring, a dental device could be considered for which we can send a referral to dentistry if he would like to explore this treatment option. At this juncture, he can follow up with the referring provider.  Thanks,  Huston Foley, MD, PhD [facility])

## 2023-03-08 NOTE — Telephone Encounter (Signed)
Pt LVM on sleep lab phone, said he was returning a call that said to call back at 1:30pm.

## 2023-03-08 NOTE — Procedures (Signed)
   [facility]  HOME SLEEP TEST (Watch PAT) REPORT  STUDY DATE: 03/06/23  DOB: 05/15/1946  MRN: [id number]  ORDERING CLINICIAN: Huston Foley, MD, PhD   REFERRING CLINICIAN: Garlan Fillers, MD   CLINICAL INFORMATION/HISTORY: 77 year old male with an underlying complex medical history of atrial flutter, hypertrophic cardiomyopathy, complete heart block, paroxysmal A-fib with status post cardioversion in August 2021 as well as April 2024, and pending ablation in October 2024, status post pacemaker placement in June 2021, history of DVT and PE, cervical radiculopathy, allergic rhinitis, diverticulosis, reflux disease, hypothyroidism, hyperlipidemia, and overweight state, who reports snoring and difficulty sleeping at night.   Epworth sleepiness score: 2/24.  BMI: 29.6 kg/m  FINDINGS:   Sleep Summary:   Total Recording Time (hours, min): 9 hours, 6 min  Total Sleep Time (hours, min):  7 hours, 56 min  Percent REM (%):    10.4%   Respiratory Indices:   Calculated pAHI (per hour):  9.7/hour (3.8/h, utilizing the 4% desaturation criteria for hypopneas per Medicare guidelines)        REM pAHI:    9.8/hour       NREM pAHI: 9.7/hour  Central pAHI: 0.1/hour  Oxygen Saturation Statistics:    Oxygen Saturation (%) Mean: 93%   Minimum oxygen saturation (%):                 82%   O2 Saturation Range (%): 82-98%    O2 Saturation (minutes) <=88%: 0.9 min  Pulse Rate Statistics:   Pulse Mean (bpm):    62/min    Pulse Range (45-89/min)   IMPRESSION: Primary snoring   RECOMMENDATION:  This home sleep test does not demonstrate any significant obstructive or central sleep disordered breathing with a total AHI of less than 5/hour.  Utilizing the 4% desaturation criteria for hypopneas per Medicare guidelines, his total AHI was 3.8/h, O2 nadir 82% for the night, time below or at 88% saturation of less than 1 minute for the night.  Snoring was intermittent in  the mild to moderate range, rarely louder.  Treatment with a positive airway pressure device such as AutoPap or CPAP is not indicated based on this test. Snoring may improve with avoidance of the supine sleep position and weight loss (where clinically appropriate).   For disturbing snoring, an oral appliance through dentistry or orthodontics can be considered (in appropriate candidates).  Other causes of the patient's symptoms, including circadian rhythm disturbances, an underlying mood disorder, medication effect and/or an underlying medical problem cannot be ruled out based on this test. Clinical correlation is recommended.  The patient should be cautioned not to drive, work at heights, or operate dangerous or heavy equipment when tired or sleepy. Review and reiteration of good sleep hygiene measures should be pursued with any patient. The patient will be advised to follow up with his referring provider, who will be notified of the test results.   I certify that I have reviewed the raw data recording prior to the issuance of this report in accordance with the standards of the American Academy of Sleep Medicine (AASM).  INTERPRETING PHYSICIAN:   Huston Foley, MD, PhD Medical Director, [facility]) Diplomat, ABPN (Neurology and Sleep)   [facility] [address] [place], [place] [address] [phone]

## 2023-03-11 NOTE — Progress Notes (Unsigned)
Date:  03/13/2023   ID:  Chase Hill, DOB 03/12/1946, MRN [id number]  PCP:  Garlan Fillers, MD  Cardiologist:  Paige Monarrez [place], MD  Electrophysiologist:  Lanier Prude, MD   Evaluation Performed:  Follow-Up Visit   Chief Complaint:  HOCM  History of Present Illness:    Chase Hill is a 77 y.o. male with PMH of hypertrophic cardiomyopathy, history of DVT/PE, TAA, hyperlipidemia, RBBB, Gilbert's syndrome, hypothyroidism.   Stress Myoview in 2011 showed inferior and apical ischemia.  Subsequent cardiac cath showed no obstructive disease.  In 2014, echocardiogram demonstrated basal septal hypertrophy with mild LV outflow tract gradient and chordal versus valvular SAM, moderate diastolic dysfunction.  Cardiac MRI obtained in June 2014 showed mild focal basal septal hypertrophy with systolic anterior motion of the mitral valve and mild MR.  There was no significant delayed enhancement.  He has been intolerant of pravastatin due to myalgia.  He also developed  fatigue and orthostasis on beta-blocker.   Due to increased septal hypertrophy and outflow gradient on previous echo in July 2018, he was started on Cardizem.  This was later discontinued due to concern of conduction disease with first-degree AV block, left anterior fascicular block and a right bundle branch block. He later underwent laser ablation of varicose veins by Dr. Darrick Penna. He was diagnosed with PE on CT angiogram of the chest in January 2019 and was placed on Xarelto.  PE was felt to be provoked by laser vein treatment.  He completed 6 months of therapy with Xarelto and switch to aspirin.  CT angiogram of the chest recently showed 4.6 cm thoracic aortic aneurysm.  Recent lower extremity venous Doppler obtained on 04/10/2019 was negative for DVT.  Repeat echocardiogram obtained on 04/24/2019, EF was greater than 65%, severe LVH, significant outflow obstruction at the mid cavity and  LVOT with peak gradient of at least 100  mmHg, systolic anterior motion of mitral leaflet, mild dilatation of the ascending aorta measuring 43 mm.   We did proceed with cardiac cath on 06/26/19. This showed normal coronary anatomy. Normal LV and right heart pressures. LVOT gradient 20 mm Hg by cath. MRI was also done showing typical features of HOCM with predominant sigmoid septum and SAM. There was only mild late gadolinium enhancement. An Event monitor was placed with results noted below.  He was subsequently referred to [facility] and underwent a Septal myectomy on 01/14/20 by Dr Maryanna Shape. On 01/16/20 he had a left sided DDD pacemaker (Medtronic XT AZT MRI) placed for development of complete heart block. He did well and was DC on 01/20/20. He was readmitted 6/24-6/29/21 with acute pulmonary embolus. He had a 10 day history of increased SOB. Was noted on pacemaker check to have paroxysmal AFib. Was started on Eliquis but symptoms progressed and he presented to the ED.EKG showed atrial flutter. Chest x-ray showed minimal fibrosis/atelectasis in left lung base, otherwise clear. Chest CT showed acute multilobe for segmental and subsegmental thrombo emboli affecting all lobes. Chest CT and bedside echocardiogram did not reveal evidence of right heart strain. The patient received IV fluids, loading dose of heparin and was initiated on high-intensity heparin GTT. The following morning the patient did have a dip in his platelets and vascular Medicine had concern for possible HIT, which was positive. The patient was transitioned to bivalirudin. Lupus anticoagulant was negative. Upon discharge the patient transition back to apixaban 10 mg b.i.d. x7 days and then 5 mg b.i.d. x3 months or indefinitely for  atrial fibrillation. LE doppler showed extensive DVT in left leg from the common femoral vein down.The patient's outpatient cardiologist, Dr. Gwendalyn Ege, was contacted regarding starting amiodarone while the patient was hospitalized. He recommended initiating amiodarone  200 mg b.i.d. x7 days followed by 200 mg daily with Cardiology follow up.  Since then he has had persistent Afib. Was seen by Dr Lalla Brothers who initially increased his amiodarone. He then developed side effects on the amiodarone with tremors, difficulty writing and typing,  and imbalance. He had DCCV on 04/12/20 and amiodarone was discontinued. Last pacer check in May 2023 showed Afib burden < 0.1%. In June 2022 Venous dopplers done at that time due to swelling and were unchanged from before with left femoral and popliteal chronic DVT.   Echo done 03/15/21 results as noted below.   He was seen in April. He had noted increased fatigue and dyspnea with exertion. No real palpitations. No chest pain.  Still working. No chest pain or palpitations. His energy level is good. On his last visit he was found to be back in Aflutter. Echo looked good. He underwent DCCV on April 24. Maintained NSR for about a month then went back into flutter. He was seen by Dr Lalla Brothers and plan is to have Afib ablation in October.   His symptoms are about the same. He is able to do things but still has some dyspnea. Had an episode a couple of weeks ago where he was watching TV and his vision got blurry and he felt things going around. Lasted about 30 seconds and resolved. No symptoms since then.     Past Medical History:  Diagnosis Date   ALLERGIC RHINITIS 08/16/2009   Qualifier: Diagnosis of  By: Alwyn Ren MD, Chrissie Noa   Onset:as a child Character: perennial but increased seasonally Triggers: dust mites; minor as pollen Allergy testing: yes X 2; as child & in 1995, Dr Jethro Bolus. Never took shots Maintenance medications/ response:Loratidine daily Smoking history:never Family history pulmonary disease: no      Cervical radiculopathy at C7    PMH of   Chest pain 11/22/2012   Evaluated by Dr Marca Ancona, Cardiology 2014 ECHO, MR : ? hypertrophic cardiomyopathy Rx: Beta blocker    Diverticulosis    colonoscopy 10-15-2006   DIVERTICULOSIS,  COLON 08/16/2009   Qualifier: Diagnosis of  By: Alwyn Ren MD, Chrissie Noa   Dr Jarold Motto, GI     DVT (deep venous thrombosis) (HCC)    ELECTROCARDIOGRAM, ABNORMAL 08/16/2009   Qualifier: Diagnosis of  By: Alwyn Ren MD, William   Right bundle branch block, left posterior fascicular block( bifascicular block), not present 01/17/2007; noted 08/16/2009     GERD (gastroesophageal reflux disease)    Sullivan Lone syndrome    GILBERT'S SYNDROME 01/14/2007   Qualifier: Diagnosis of  By: Alwyn Ren MD, William     Hemorrhoids    Hyperlipidemia    Hypothyroidism    Obstructive hypertrophic cardiomyopathy (HCC) 01/15/2013   Perennial allergic rhinitis with seasonal variation    Pulmonary embolism (HCC)    REFLUX, ESOPHAGEAL 01/14/2007   Qualifier: Diagnosis of  By: Alwyn Ren MD, William     Thoracic aortic aneurysm without rupture (HCC) 03/20/2017   THYROID FUNCTION TEST, ABNORMAL 09/07/2010   Qualifier: Diagnosis of  By: Alwyn Ren MD, Chrissie Noa      Varicose veins of right lower extremity with complications 02/04/2018   Past Surgical History:  Procedure Laterality Date   CARDIOVERSION N/A 04/12/2020   Procedure: CARDIOVERSION;  Surgeon: Jodelle Red, MD;  Location: [facility] ENDOSCOPY;  Service: Cardiovascular;  Laterality: N/A;   CARDIOVERSION N/A 12/06/2022   Procedure: CARDIOVERSION;  Surgeon: Chilton Si, MD;  Location: [facility] INVASIVE CV LAB;  Service: Cardiovascular;  Laterality: N/A;   COLONOSCOPY  2010   Tics   ENDOVENOUS ABLATION SAPHENOUS VEIN W/ LASER Right 08/21/2018   endovenous laser ablation right greater saphenous vein and stab phlebectomy > 20 incisions right leg by Fabienne Bruns MD    RIGHT/LEFT HEART CATH AND CORONARY ANGIOGRAPHY N/A 06/26/2019   Procedure: RIGHT/LEFT HEART CATH AND CORONARY ANGIOGRAPHY;  Surgeon: [place], Lively Haberman M, MD;  Location: [facility] INVASIVE CV LAB;  Service: Cardiovascular;  Laterality: N/A;   WISDOM TOOTH EXTRACTION       Current Meds  Medication Sig   ELIQUIS 5 MG TABS tablet Take 5 mg by  mouth 2 (two) times daily.   levothyroxine (SYNTHROID) 150 MCG tablet Take 150 mcg by mouth daily before breakfast.   loratadine (CLARITIN) 10 MG tablet Take 10 mg by mouth daily after breakfast.    Multiple Vitamin (MULTIVITAMIN WITH MINERALS) TABS tablet Take 1 tablet by mouth every evening.   rosuvastatin (CRESTOR) 5 MG tablet Take 5 mg by mouth every Monday, Wednesday, and Friday.     Allergies:   Heparin   Social History   Tobacco Use   Smoking status: Never   Smokeless tobacco: Never  Vaping Use   Vaping status: Never Used  Substance Use Topics   Alcohol use: Yes    Alcohol/week: 1.0 standard drink of alcohol    Types: 1 Glasses of wine per week    Comment:  < 3 /week , only socially   Drug use: No     Family Hx: The patient's family history includes Breast cancer in his mother; Congestive Heart Failure in his father; Coronary artery disease in his father and paternal aunt; Stroke (age of onset: 18) in his maternal grandmother. There is no history of Diabetes, Hypertension, Colon cancer, or Sleep apnea.  ROS:   Please see the history of present illness.    All other systems reviewed and are negative.   Prior CV studies:   The following studies were reviewed today:  CTA of chest 02/19/2019 IMPRESSION: 1. No change in caliber of the tubular ascending thoracic aorta, measuring 4.6 x 4.3 cm, not significantly changed in caliber, measuring 4.5 x 4.2 cm on examinations dating back to 03/23/2017. The sinuses of Valsalva measure up to 4.4 cm in caliber. Normal caliber of the aortic valve. The descending thoracic aorta is normal in caliber. No significant atherosclerosis.   2. Examination is not tailored for the evaluation of pulmonary embolism, however there is no obvious residual pulmonary embolism in the right lower lobe at the site of embolism seen on prior examination dated 09/11/2018.   3.  Stable, benign small pulmonary nodules.     Venous doppler  04/10/2019 Summary: Right: Successful vein closure. There is no evidence of deep vein thrombosis in the lower extremity. No cystic structure found in the popliteal fossa. No significant change compared to previous study. Left: No evidence of common femoral vein obstruction.   *See table(s) above for measurements and observations.     Echo 04/24/2019 IMPRESSIONS  1. The left ventricle has hyperdynamic systolic function, with an ejection fraction of >65%. The cavity size was normal. There is severely increased left ventricular wall thickness. Left ventricular diastolic Doppler parameters are consistent with  impaired relaxation.  2. LVOT is narrowed The LV is severely hypertrophied. There is significant outflow obstruction  at mid cavity and through LVOT with peak gradient at least 100 mm Hg. All consistent with HOCM.  3. The right ventricle has normal systolic function. The cavity was normal. There is no increase in right ventricular wall thickness.  4. Left atrial size was mildly dilated.  5. There is systolic anterior motion of mitral leaflets.  6. The aortic valve is tricuspid. Mild thickening of the aortic valve. Aortic valve regurgitation is mild by color flow Doppler.  7. The aorta is abnormal unless otherwise noted.  8. The ascending aorta is normal in size and structure.  9. There is mild dilatation of the ascending aorta measuring 43 mm.    Cardiac cath 06/26/19:  RIGHT/LEFT HEART CATH AND CORONARY ANGIOGRAPHY  Conclusion    The left ventricular systolic function is normal. LV end diastolic pressure is normal. The left ventricular ejection fraction is 55-65% by visual estimate.   1. Normal coronary anatomy 2. Normal LV function' 3. 20 mm Hg LVOT gradient at rest. Typical Brockenbrough sign post PVC. 4. Normal/low LV filling pressures 5. Normal right  Heart pressures 6. Normal cardiac output 7. Moderate MR but post PVC   Plan: recommend further evaluation with cardiac MRI  and event monitor.      CLINICAL DATA:  Hypertrophic cardiomyopathy suspected, further testing   COMPARISON: No images available from prior cardiac MRI for comparison   EXAM: CARDIAC MRI   TECHNIQUE: The patient was scanned on a 1.5 Tesla GE magnet. A dedicated cardiac coil was used. Functional imaging was done using Fiesta sequences. 2,3, and 4 chamber views were done to assess for RWMA's. Modified Simpson's rule using a short axis stack was used to calculate an ejection fraction on a dedicated work [employer]. The patient received 10mL GADAVIST GADOBUTROL 1 MMOL/ML IV SOLN. After 10 minutes inversion recovery sequences were used to assess for infiltration and scar tissue.   CONTRAST:  10mL GADAVIST GADOBUTROL 1 MMOL/ML IV SOLN   FINDINGS: LEFT VENTRICLE:   Normal left ventricular size. Hyperdynamic systolic function (LVEF = 69%).   There are no regional wall motion abnormalities.   Late gadolinium enhancement in the left ventricular myocardium is seen in the inferior RV insertion point at the mid ventricle and apex. This finding is seen in an area of increased myocardial wall thickness. Differential diagnosis includes fibrosis secondary to hypertrophic cardiomyopathy vs increased pulmonary pressures.   Normal T1 myocardial nulling kinetics suggest against a diagnosis of cardiac amyloidosis.   Maximal wall thickness: 18 mm   Location: Basal septum.   There is systolic anterior motion of the mitral valve. Flow dephasing suggests left ventricular outflow tract obstruction.   Mitral regurgitation is present and posteriorly directed.   No evidence of left ventricular apical aneurysm.   Findings are consistent with hypertrophic cardiomyopathy with obstruction. Morphologic subtype: Sigmoid subtype.   RIGHT VENTRICLE:   Normal right ventricular size, thickness and systolic function (RVEF = 63%).   There are no regional wall motion  abnormalities.   No delayed myocardial enhancement in the right ventricle.   ATRIA:   Mildly dilated left atrial chamber size.  normal right atrial size.   VALVES:   Systolic anterior motion of the mitral valve with posteriorly directed mitral regurgitation, qualitatively mild.   Qualitatively trivial aortic valve regurgitation by flow dephasing.   PERICARDIUM:   Normal pericardium.  No pericardial effusion.   AORTA:   Mid ascending aorta measures 43 mm in a sagittal transverse plane. Orthogonal measurements unable  to be performed for true axial measurement.   OTHER:   MEASUREMENTS:   LVEDV: 145 ml   LVESV: 45 ml   SV: 100 ml   CO: 6 L/min   Myocardial mass: 184 g   RVEDV: 100 ml   RVEDS: 38 ml   RVSV: 63 mL   IMPRESSION: 1.  Hyperdynamic left ventricular systolic function.  LVEF 69%.   2.  Normal right ventricular chamber size and function.  RVEF 63%.   3. Findings consistent with hypertrophic cardiomyopathy, sigmoid subtype, maximal wall thickness 18 mm at the basal left ventricular septum.   4. Left ventricular outflow tract obstruction. Flow dephasing seen in the LVOT with systolic anterior motion of the mitral valve, and posteriorly directed mitral valve regurgitation.   5. Delayed myocardial enhancement is seen in an area of increased left ventricular wall thickness at the mid ventricle, at the inferior RV insertion point. This finding could represent fibrosis in the setting hypertrophic cardiomyopathy vs. increased pulmonary pressures.   6.  Mid ascending aorta measures 43 mm in a transverse plane.     Electronically Signed   By: Weston Brass  Event monitor 08/19/19.Study Highlights  Normal sinus rhythm with first degree AV block Episodes of second degree AV block Mobitz type 1 PVCs. One run of AIVR PACs with short bursts of PACs- longest 6 beats. No Afib noted- episodes noted on monitor appear to be artifact.   Echo  01/19/20:Echocardiogram 01/19/2020 Final Impressions 1. Hypertrophic cardiomyopathy status post left ventricular septal myectomy (14-Jan-2020). 2. Systolic anterior motion of mitral apparatus with trivial mitral valve regurgitation. No dynamic left ventricular outflow tract obstruction at rest. 3. Mild aortic valve regurgitation (central jet). ERO 0.07 cm2, regurgitant volume 19 mL. 4. Normal left ventricular chamber size, calculated 2-D linear ejection fraction 70 %. 5. Normal right ventricular chamber size, mildly reduced systolic function, estimated right ventricular systolic pressure 22 mmHg (systolic blood pressure 129 mmHg). 6. Mildly enlarged mid ascending aorta diameter (diameter 45 mm at mid level). Upper limits of normal for age, sex, and BSA = 43 mm. 7. No pericardial effusion.    Echo 01/19/20: Final Impressions  1. Hypertrophic cardiomyopathy status post left ventricular septal myectomy (14-Jan-2020).  2. Systolic anterior motion of mitral apparatus with trivial mitral valve regurgitation. No  dynamic left ventricular outflow tract obstruction at rest.  3. Mild aortic valve regurgitation (central jet). ERO 0.07 cm^2, regurgitant volume 19 mL.  4. Normal left ventricular chamber size, calculated 2-D linear ejection fraction 70 %.  5. Normal right ventricular chamber size, mildly reduced systolic function, estimated right  ventricular systolic pressure 22 mmHg (systolic blood pressure 129 mmHg).  6. Mildly enlarged mid ascending aorta diameter (diameter 45 mm at mid level).  Upper limits of  normal for age, sex, and BSA = 43 mm.  7. No pericardial effusion.  Echo : 03/15/21: IMPRESSIONS     1. Left ventricular ejection fraction, by estimation, is 55 to 60%. The  left ventricle has normal function. The left ventricle has no regional  wall motion abnormalities but there is septal-lateral dyssynchrony  consistent with RV pacing. There is mild  left ventricular hypertrophy. Left ventricular  diastolic parameters are  consistent with Grade I diastolic dysfunction (impaired relaxation). There  is no significant LV outflow gradient noted and no mitral valve SAM noted.   2. Right ventricular systolic function is normal. The right ventricular  size is normal. Tricuspid regurgitation signal is inadequate for assessing  PA pressure.  3. The mitral valve is normal in structure. Trivial mitral valve  regurgitation. No evidence of mitral stenosis.   4. The aortic valve is tricuspid. Aortic valve regurgitation is mild to  moderate. Mild aortic valve sclerosis is present, with no evidence of  aortic valve stenosis.   5. Aortic dilatation noted. There is moderate dilatation of the ascending  aorta, measuring 46 mm.   6. The inferior vena cava is normal in size with greater than 50%  respiratory variability, suggesting right atrial pressure of 3 mmHg.   Comparison(s): 04/24/19 EF >65%.   Echo 11/24/22: IMPRESSIONS     1. Left ventricular ejection fraction, by estimation, is 60 to 65%. The  left ventricle has normal function. The left ventricle has no regional  wall motion abnormalities. Abnormal (paradoxical) septal motion,  consistent with RV pacemaker.      There is moderate concentric left ventricular hypertrophy. Left  ventricular diastolic parameters are indeterminate. No significant LV  outflow gradient and no MV SAM.   2. Right ventricular systolic function is normal. The right ventricular  size is normal. There is normal pulmonary artery systolic pressure. The  estimated right ventricular systolic pressure is 20.1 mmHg.   3. Left atrial size was severely dilated.   4. The mitral valve is normal in structure. Mild mitral valve  regurgitation. No evidence of mitral stenosis.   5. The aortic valve is normal in structure. Aortic valve regurgitation is  mild to moderate. No aortic stenosis is present. Aortic regurgitation PHT  measures 581 msec.   6. Aortic dilatation noted.  There is mild dilatation of the aortic root,  measuring 44 mm. There is moderate dilatation of the ascending aorta,  measuring 46 mm.   7. The inferior vena cava is normal in size with greater than 50%  respiratory variability, suggesting right atrial pressure of 3 mmHg.    Labs/Other Tests and Data Reviewed:    EKG Interpretation Date/Time:  Tuesday March 13 2023 15:20:06 EDT Ventricular Rate:  70 PR Interval:    QRS Duration:  190 QT Interval:  502 QTC Calculation: 542 R Axis:   -82  Text Interpretation: Atrial flutter Ventricular-paced rhythm When compared with ECG of 06-Dec-2022 09:15, in Atrial flutter now. Confirmed by [place], Anwen Cannedy [phone]) on 03/13/2023 3:41:54 PM     Recent Labs: 11/22/2022: ALT 27; BUN 16; Creatinine, Ser 1.24; Hemoglobin 16.1; Platelets 202; Potassium 4.6; Sodium 140; TSH 0.605   Recent Lipid Panel Lab Results  Component Value Date/Time   CHOL 160 11/22/2022 04:21 PM   CHOL 202 (H) 04/29/2015 07:34 AM   TRIG 279 (H) 11/22/2022 04:21 PM   TRIG 165 (H) 04/29/2015 07:34 AM   HDL 46 11/22/2022 04:21 PM   HDL 47 04/29/2015 07:34 AM   CHOLHDL 3.5 11/22/2022 04:21 PM   CHOLHDL 3 08/24/2016 07:27 AM   LDLCALC 69 11/22/2022 04:21 PM   LDLCALC 122 (H) 04/29/2015 07:34 AM   LDLDIRECT 78.7 03/28/2013 07:32 AM   Dated 08/12/19: creatinine 1.2. otherwise CMET normal. CBC and TSH normal. Cholesterol 177, triglycerides 209, HDL 43, LDL 92.  Dated 02/19/20: Hgb 12.9, creatinine 1.3. TSH 5.22. ALT 25. Dated 08/19/20: cholesterol 161, triglycerides 186, HDL 43, LDL 81. CMET normal.  Dated 10/12/21: cholesterol 153, triglycerides 105, HDL 45, LDL 89. Creatinine 1.36. otherwise CMET, CBC, and TFTs normal.   Wt Readings from Last 3 Encounters:  03/13/23 214 lb (97.1 kg)  02/06/23 211 lb 9.6 oz (96 kg)  01/24/23 215 lb 6.4 oz (  97.7 kg)     Objective:    Vital Signs:  BP 118/78   Pulse 70   Ht 5\' 11"  (1.803 m)   Wt 214 lb (97.1 kg)   SpO2 99%   BMI 29.85 kg/m     GENERAL:  Well appearing WM in NAD HEENT:  PERRL, EOMI, sclera are clear. Oropharynx is clear. NECK:  No jugular venous distention, carotid upstroke brisk and symmetric, no bruits, no thyromegaly or adenopathy LUNGS:  Clear to auscultation bilaterally CHEST:  Unremarkable HEART:  IRRR,  PMI not displaced or sustained,S1 and S2 within normal limits, no S3, no S4: no clicks, no rubs, no murmurs.  ABD:  Soft, nontender. BS +, no masses or bruits. No hepatomegaly, no splenomegaly EXT:  2 + pulses throughout, no  LLE edema, no cyanosis no clubbing no redness SKIN:  Warm and dry.  No rashes NEURO:  Alert and oriented x 3. Cranial nerves II through XII intact. PSYCH:  Cognitively intact   ASSESSMENT & PLAN:    Hypertrophic cardiomyopathy:  s/p septal Myectomy at [facility] in June 2021. S/p DDDR pacemaker for CHB which was anticipated. Course complicated by development of large Left leg DVT and pulmonary embolus. This was associated with  AFib/flutter.On Eliquis.  Echo post op at [facility] on 01/19/20 showed no outflow gradient and trivial MR. Normal EF. Echo this year was stable.    Thoracic aortic aneurysm: Mildly dilated aorta on echocardiogram measuring up to 43 mm.    Atrial flutter - first recurrence since 2021. Symptomatic. S/p DCCV in April but only lasted a month. Plan for ablation in October with Dr Lalla Brothers. On Eliquis   Hypothyroidism: On Synthroid.    History of PE past- more recent DVT/ PE post op heart surgery. Repeat venous doppler showed persistent common femoral and popliteal DVT but improved signal distally. Will continue Eliquis chronically and support hose.   Second degree AV block Mobitz type 1 progressing to CHB post septal myectomy. S/p DDDR pacemaker with Medtronic generator. Pacer check normal  7. Hypercholesterolemia. On statin- update labs.  8.   History of HIT.    Medication Adjustments/Labs and Tests Ordered: Current medicines are reviewed at length with the  patient today.  Concerns regarding medicines are outlined above.   Tests Ordered: Orders Placed This Encounter  Procedures   EKG 12-Lead     Medication Changes: No orders of the defined types were placed in this encounter.    Follow Up: 3 months.   Signed, Susen Haskew [place], MD  03/13/2023 4:10 PM    [facility]

## 2023-03-13 ENCOUNTER — Ambulatory Visit: Payer: Medicare Other | Attending: Cardiology | Admitting: Cardiology

## 2023-03-13 ENCOUNTER — Encounter: Payer: Self-pay | Admitting: Cardiology

## 2023-03-13 VITALS — BP 118/78 | HR 70 | Ht 71.0 in | Wt 214.0 lb

## 2023-03-13 DIAGNOSIS — Z950 Presence of cardiac pacemaker: Secondary | ICD-10-CM | POA: Insufficient documentation

## 2023-03-13 DIAGNOSIS — I422 Other hypertrophic cardiomyopathy: Secondary | ICD-10-CM | POA: Diagnosis present

## 2023-03-13 DIAGNOSIS — I4819 Other persistent atrial fibrillation: Secondary | ICD-10-CM | POA: Diagnosis present

## 2023-03-13 DIAGNOSIS — I483 Typical atrial flutter: Secondary | ICD-10-CM | POA: Insufficient documentation

## 2023-03-13 DIAGNOSIS — I442 Atrioventricular block, complete: Secondary | ICD-10-CM | POA: Insufficient documentation

## 2023-03-13 NOTE — Patient Instructions (Signed)
Medication Instructions:  Continue same medications *If you need a refill on your cardiac medications before your next appointment, please call your pharmacy*   Lab Work: None ordered   Testing/Procedures: None ordered   Follow-Up: At [facility], you and your health needs are our priority.  As part of our continuing mission to provide you with exceptional heart care, we have created designated Provider Care Teams.  These Care Teams include your primary Cardiologist (physician) and Advanced Practice Providers (APPs -  Physician Assistants and Nurse Practitioners) who all work together to provide you with the care you need, when you need it.  We recommend signing up for the patient portal called "MyChart".  Sign up information is provided on this After Visit Summary.  MyChart is used to connect with patients for Virtual Visits (Telemedicine).  Patients are able to view lab/test results, encounter notes, upcoming appointments, etc.  Non-urgent messages can be sent to your provider as well.   To learn more about what you can do with MyChart, go to [email or web address].com.    Your next appointment:  6 months    Call in Sept to schedule Jan appointment     Provider:  Dr.Jordan   

## 2023-03-21 ENCOUNTER — Ambulatory Visit (INDEPENDENT_AMBULATORY_CARE_PROVIDER_SITE_OTHER): Payer: Medicare Other

## 2023-03-21 DIAGNOSIS — I422 Other hypertrophic cardiomyopathy: Secondary | ICD-10-CM | POA: Diagnosis not present

## 2023-03-21 LAB — CUP PACEART REMOTE DEVICE CHECK
Battery Remaining Longevity: 110 mo
Battery Voltage: 3.01 V
Brady Statistic RA Percent Paced: 0 %
Brady Statistic RV Percent Paced: 95.02 %
Date Time Interrogation Session: 20240807061150
Implantable Lead Connection Status: 753985
Implantable Lead Connection Status: 753985
Implantable Lead Implant Date: 20210604
Implantable Lead Implant Date: 20210604
Implantable Lead Location: 753859
Implantable Lead Location: 753860
Implantable Lead Model: 4076
Implantable Lead Model: 4076
Implantable Pulse Generator Implant Date: 20210604
Lead Channel Impedance Value: 266 Ohm
Lead Channel Impedance Value: 361 Ohm
Lead Channel Impedance Value: 399 Ohm
Lead Channel Impedance Value: 456 Ohm
Lead Channel Pacing Threshold Amplitude: 0.625 V
Lead Channel Pacing Threshold Amplitude: 0.75 V
Lead Channel Pacing Threshold Pulse Width: 0.4 ms
Lead Channel Pacing Threshold Pulse Width: 0.4 ms
Lead Channel Sensing Intrinsic Amplitude: 10.375 mV
Lead Channel Sensing Intrinsic Amplitude: 10.375 mV
Lead Channel Sensing Intrinsic Amplitude: 6.375 mV
Lead Channel Sensing Intrinsic Amplitude: 6.375 mV
Lead Channel Setting Pacing Amplitude: 1.5 V
Lead Channel Setting Pacing Amplitude: 2 V
Lead Channel Setting Pacing Pulse Width: 0.4 ms
Lead Channel Setting Sensing Sensitivity: 0.9 mV
Zone Setting Status: 755011
Zone Setting Status: 755011

## 2023-04-06 NOTE — Progress Notes (Signed)
Remote pacemaker transmission.   

## 2023-05-14 ENCOUNTER — Other Ambulatory Visit (HOSPITAL_COMMUNITY): Payer: Self-pay | Admitting: Urology

## 2023-05-14 ENCOUNTER — Encounter: Payer: Self-pay | Admitting: Cardiology

## 2023-05-14 DIAGNOSIS — N402 Nodular prostate without lower urinary tract symptoms: Secondary | ICD-10-CM

## 2023-05-14 DIAGNOSIS — R972 Elevated prostate specific antigen [PSA]: Secondary | ICD-10-CM

## 2023-05-28 ENCOUNTER — Ambulatory Visit: Payer: Medicare Other | Attending: Cardiology

## 2023-05-28 DIAGNOSIS — I4819 Other persistent atrial fibrillation: Secondary | ICD-10-CM

## 2023-05-29 LAB — CBC WITH DIFFERENTIAL/PLATELET
Basophils Absolute: 0 10*3/uL (ref 0.0–0.2)
Basos: 1 %
EOS (ABSOLUTE): 0.1 10*3/uL (ref 0.0–0.4)
Eos: 1 %
Hematocrit: 48.6 % (ref 37.5–51.0)
Hemoglobin: 16.2 g/dL (ref 13.0–17.7)
Immature Grans (Abs): 0 10*3/uL (ref 0.0–0.1)
Immature Granulocytes: 0 %
Lymphocytes Absolute: 1.9 10*3/uL (ref 0.7–3.1)
Lymphs: 30 %
MCH: 31.7 pg (ref 26.6–33.0)
MCHC: 33.3 g/dL (ref 31.5–35.7)
MCV: 95 fL (ref 79–97)
Monocytes Absolute: 0.7 10*3/uL (ref 0.1–0.9)
Monocytes: 11 %
Neutrophils Absolute: 3.6 10*3/uL (ref 1.4–7.0)
Neutrophils: 57 %
Platelets: 206 10*3/uL (ref 150–450)
RBC: 5.11 x10E6/uL (ref 4.14–5.80)
RDW: 13.1 % (ref 11.6–15.4)
WBC: 6.3 10*3/uL (ref 3.4–10.8)

## 2023-05-29 LAB — BASIC METABOLIC PANEL
BUN/Creatinine Ratio: 14 (ref 10–24)
BUN: 19 mg/dL (ref 8–27)
CO2: 21 mmol/L (ref 20–29)
Calcium: 9.3 mg/dL (ref 8.6–10.2)
Chloride: 105 mmol/L (ref 96–106)
Creatinine, Ser: 1.34 mg/dL — ABNORMAL HIGH (ref 0.76–1.27)
Glucose: 82 mg/dL (ref 70–99)
Potassium: 4.3 mmol/L (ref 3.5–5.2)
Sodium: 140 mmol/L (ref 134–144)
eGFR: 54 mL/min/{1.73_m2} — ABNORMAL LOW (ref >59)

## 2023-06-04 ENCOUNTER — Ambulatory Visit (HOSPITAL_COMMUNITY)
Admission: RE | Admit: 2023-06-04 | Discharge: 2023-06-04 | Disposition: A | Discharge status: Home / Self Care | Payer: Medicare Other | Source: Ambulatory Visit | Attending: Cardiology | Admitting: Cardiology

## 2023-06-04 DIAGNOSIS — I4819 Other persistent atrial fibrillation: Secondary | ICD-10-CM | POA: Diagnosis present

## 2023-06-04 MED ORDER — IOHEXOL 350 MG/ML SOLN
95.0000 mL | Freq: Once | INTRAVENOUS | Status: AC | PRN
  Administered 2023-06-04: 95 mL via INTRAVENOUS

## 2023-06-08 NOTE — Pre-Procedure Instructions (Signed)
Instructed patient on the following items: Arrival time 0515 Nothing to eat or drink after midnight No meds AM of procedure Responsible person to drive you home and stay with you for 24 hrs  Have you missed any doses of anti-coagulant Eliquis- should be taken twice a day, if you have missed any doses please let us know.  Don't take any medications on Monday morning.

## 2023-06-10 NOTE — Anesthesia Preprocedure Evaluation (Signed)
Anesthesia Evaluation  Patient identified by MRN, date of birth, ID band Patient awake    Reviewed: Allergy & Precautions, NPO status , Patient's Chart, lab work & pertinent test results  Airway Mallampati: II  TM Distance: >3 FB Neck ROM: Full    Dental no notable dental hx.    Pulmonary PE   Pulmonary exam normal        Cardiovascular + DVT  + dysrhythmias Atrial Fibrillation + pacemaker  Rhythm:Irregular Rate:Normal     Neuro/Psych negative neurological ROS  negative psych ROS   GI/Hepatic Neg liver ROS,GERD  ,,  Endo/Other  Hypothyroidism    Renal/GU   negative genitourinary   Musculoskeletal negative musculoskeletal ROS (+)    Abdominal Normal abdominal exam  (+)   Peds  Hematology negative hematology ROS (+)   Anesthesia Other Findings   Reproductive/Obstetrics                             Anesthesia Physical Anesthesia Plan  ASA: 3  Anesthesia Plan: General   Post-op Pain Management: Tylenol PO (pre-op)*   Induction: Intravenous  PONV Risk Score and Plan: 2 and Ondansetron, Dexamethasone and Treatment may vary due to age or medical condition  Airway Management Planned: Mask, Oral ETT and Video Laryngoscope Planned  Additional Equipment: None  Intra-op Plan:   Post-operative Plan: Extubation in OR  Informed Consent: I have reviewed the patients History and Physical, chart, labs and discussed the procedure including the risks, benefits and alternatives for the proposed anesthesia with the patient or authorized representative who has indicated his/her understanding and acceptance.     Dental advisory given  Plan Discussed with: CRNA  Anesthesia Plan Comments:        Anesthesia Quick Evaluation

## 2023-06-11 ENCOUNTER — Ambulatory Visit (HOSPITAL_COMMUNITY)
Admission: RE | Admit: 2023-06-11 | Discharge: 2023-06-11 | Disposition: A | Discharge status: Home / Self Care | Payer: Medicare Other | Attending: Cardiology | Admitting: Cardiology

## 2023-06-11 ENCOUNTER — Other Ambulatory Visit (HOSPITAL_COMMUNITY): Payer: Self-pay

## 2023-06-11 ENCOUNTER — Other Ambulatory Visit: Payer: Self-pay

## 2023-06-11 ENCOUNTER — Encounter (HOSPITAL_COMMUNITY)
Admission: RE | Disposition: A | Discharge status: Home / Self Care | Payer: Self-pay | Source: Home / Self Care | Attending: Cardiology

## 2023-06-11 ENCOUNTER — Ambulatory Visit (HOSPITAL_COMMUNITY): Payer: Medicare Other

## 2023-06-11 ENCOUNTER — Ambulatory Visit (HOSPITAL_BASED_OUTPATIENT_CLINIC_OR_DEPARTMENT_OTHER): Payer: Medicare Other

## 2023-06-11 DIAGNOSIS — I442 Atrioventricular block, complete: Secondary | ICD-10-CM | POA: Diagnosis not present

## 2023-06-11 DIAGNOSIS — I4819 Other persistent atrial fibrillation: Secondary | ICD-10-CM | POA: Diagnosis present

## 2023-06-11 DIAGNOSIS — Z950 Presence of cardiac pacemaker: Secondary | ICD-10-CM | POA: Insufficient documentation

## 2023-06-11 DIAGNOSIS — I4891 Unspecified atrial fibrillation: Secondary | ICD-10-CM

## 2023-06-11 DIAGNOSIS — I421 Obstructive hypertrophic cardiomyopathy: Secondary | ICD-10-CM | POA: Diagnosis not present

## 2023-06-11 DIAGNOSIS — Z7901 Long term (current) use of anticoagulants: Secondary | ICD-10-CM | POA: Insufficient documentation

## 2023-06-11 DIAGNOSIS — Z888 Allergy status to other drugs, medicaments and biological substances status: Secondary | ICD-10-CM | POA: Diagnosis not present

## 2023-06-11 DIAGNOSIS — I483 Typical atrial flutter: Secondary | ICD-10-CM | POA: Insufficient documentation

## 2023-06-11 HISTORY — PX: ATRIAL FIBRILLATION ABLATION: EP1191

## 2023-06-11 SURGERY — ATRIAL FIBRILLATION ABLATION
Anesthesia: General

## 2023-06-11 MED ORDER — SODIUM CHLORIDE 0.9 % IV SOLN
Freq: Once | INTRAVENOUS | Status: AC
  Filled 2023-06-11: qty 10

## 2023-06-11 MED ORDER — PANTOPRAZOLE SODIUM 40 MG PO TBEC
40.0000 mg | DELAYED_RELEASE_TABLET | Freq: Every day | ORAL | Status: DC
  Administered 2023-06-11: 40 mg via ORAL
  Filled 2023-06-11 (×2): qty 1

## 2023-06-11 MED ORDER — BIVALIRUDIN TRIFLUOROACETATE 250 MG IV SOLR
0.7500 mg/kg/h | Freq: Once | INTRAVENOUS | Status: DC
  Filled 2023-06-11: qty 250

## 2023-06-11 MED ORDER — PHENYLEPHRINE HCL-NACL 20-0.9 MG/250ML-% IV SOLN
INTRAVENOUS | Status: DC | PRN
  Administered 2023-06-11: 30 ug/min via INTRAVENOUS

## 2023-06-11 MED ORDER — BIVALIRUDIN BOLUS VIA INFUSION
0.1000 mg/kg | Freq: Once | INTRAVENOUS | Status: DC
  Filled 2023-06-11: qty 10

## 2023-06-11 MED ORDER — CEFAZOLIN SODIUM-DEXTROSE 2-4 GM/100ML-% IV SOLN
INTRAVENOUS | Status: AC
  Filled 2023-06-11: qty 100

## 2023-06-11 MED ORDER — SODIUM CHLORIDE 0.9% FLUSH: 3.0000 mL | INTRAVENOUS | Status: DC | PRN

## 2023-06-11 MED ORDER — SUGAMMADEX SODIUM 200 MG/2ML IV SOLN
INTRAVENOUS | Status: DC | PRN
  Administered 2023-06-11: 200 mg via INTRAVENOUS

## 2023-06-11 MED ORDER — SODIUM CHLORIDE 0.9 % IV SOLN: INTRAVENOUS | Status: DC

## 2023-06-11 MED ORDER — ROCURONIUM BROMIDE 10 MG/ML (PF) SYRINGE
PREFILLED_SYRINGE | INTRAVENOUS | Status: DC | PRN
  Administered 2023-06-11: 50 mg via INTRAVENOUS
  Administered 2023-06-11: 30 mg via INTRAVENOUS
  Administered 2023-06-11: 50 mg via INTRAVENOUS

## 2023-06-11 MED ORDER — APIXABAN 5 MG PO TABS
5.0000 mg | ORAL_TABLET | Freq: Two times a day (BID) | ORAL | Status: DC
  Administered 2023-06-11: 5 mg via ORAL
  Filled 2023-06-11: qty 1

## 2023-06-11 MED ORDER — CEFAZOLIN SODIUM-DEXTROSE 2-3 GM-%(50ML) IV SOLR
INTRAVENOUS | Status: DC | PRN
  Administered 2023-06-11: 2 g via INTRAVENOUS

## 2023-06-11 MED ORDER — COLCHICINE 0.6 MG PO TABS
0.6000 mg | ORAL_TABLET | Freq: Two times a day (BID) | ORAL | 0 refills | Status: DC
  Filled 2023-06-11: qty 10, 5d supply, fill #0

## 2023-06-11 MED ORDER — SODIUM CHLORIDE 0.9 % IV SOLN: 250.0000 mL | INTRAVENOUS | Status: DC | PRN

## 2023-06-11 MED ORDER — PROPOFOL 10 MG/ML IV BOLUS
INTRAVENOUS | Status: DC | PRN
  Administered 2023-06-11: 150 mg via INTRAVENOUS

## 2023-06-11 MED ORDER — SODIUM CHLORIDE 0.9% FLUSH: 3.0000 mL | Freq: Two times a day (BID) | INTRAVENOUS | Status: DC

## 2023-06-11 MED ORDER — DEXAMETHASONE SODIUM PHOSPHATE 10 MG/ML IJ SOLN
INTRAMUSCULAR | Status: DC | PRN
  Administered 2023-06-11: 4 mg via INTRAVENOUS

## 2023-06-11 MED ORDER — BIVALIRUDIN BOLUS VIA INFUSION
0.7500 mg/kg | Freq: Once | INTRAVENOUS | Status: AC
  Administered 2023-06-11: 1.75 mg/kg/h via INTRAVENOUS
  Administered 2023-06-11: 69.8 mg via INTRAVENOUS
  Filled 2023-06-11: qty 70

## 2023-06-11 MED ORDER — ONDANSETRON HCL 4 MG/2ML IJ SOLN: 4.0000 mg | Freq: Four times a day (QID) | INTRAMUSCULAR | Status: DC | PRN

## 2023-06-11 MED ORDER — PANTOPRAZOLE SODIUM 40 MG PO TBEC
40.0000 mg | DELAYED_RELEASE_TABLET | Freq: Every day | ORAL | 0 refills | Status: DC
  Filled 2023-06-11: qty 45, 45d supply, fill #0

## 2023-06-11 MED ORDER — ACETAMINOPHEN 325 MG PO TABS: 650.0000 mg | ORAL_TABLET | ORAL | Status: DC | PRN

## 2023-06-11 MED ORDER — BIVALIRUDIN TRIFLUOROACETATE 250 MG IV SOLR
Freq: Once | INTRAVENOUS | Status: AC
  Filled 2023-06-11: qty 10

## 2023-06-11 MED ORDER — ONDANSETRON HCL 4 MG/2ML IJ SOLN
INTRAMUSCULAR | Status: DC | PRN
  Administered 2023-06-11: 4 mg via INTRAVENOUS

## 2023-06-11 MED ORDER — PHENYLEPHRINE 80 MCG/ML (10ML) SYRINGE FOR IV PUSH (FOR BLOOD PRESSURE SUPPORT)
PREFILLED_SYRINGE | INTRAVENOUS | Status: DC | PRN
  Administered 2023-06-11 (×2): 160 ug via INTRAVENOUS

## 2023-06-11 MED ORDER — COLCHICINE 0.6 MG PO TABS
0.6000 mg | ORAL_TABLET | Freq: Two times a day (BID) | ORAL | Status: DC
  Administered 2023-06-11: 0.6 mg via ORAL
  Filled 2023-06-11 (×2): qty 1

## 2023-06-11 MED ORDER — BIVALIRUDIN TRIFLUOROACETATE 250 MG IV SOLR
Freq: Once | INTRAVENOUS | Status: DC
  Filled 2023-06-11 (×2): qty 10

## 2023-06-11 SURGICAL SUPPLY — 22 items
BAG SNAP BAND KOVER 36X36 (MISCELLANEOUS) ×1
BLANKET WARM UNDERBOD FULL ACC (MISCELLANEOUS) ×1
CABLE PFA RX CATH CONN (CABLE) ×1
CATH FARAWAVE ABLATION 31 (CATHETERS) ×1
CATH OCTARAY 2.0 F 3-3-3-3-3 (CATHETERS) ×1
CATH SMTCH THERMOCOOL SF FJ (CATHETERS) ×1
CATH SOUNDSTAR ECO 8FR (CATHETERS) ×1
CATH WEBSTER BI DIR CS D-F CRV (CATHETERS) ×1
CLOSURE PERCLOSE PROSTYLE (VASCULAR PRODUCTS) ×4
COVER SWIFTLINK CONNECTOR (BAG) ×1
DILATOR VESSEL 38 20CM 16FR (INTRODUCER) ×1
INQWIRE 1.5J .035X260CM (WIRE) ×1
PACK EP LATEX FREE (CUSTOM PROCEDURE TRAY) ×1
PACK EP LF (CUSTOM PROCEDURE TRAY) ×1
PAD DEFIB RADIO PHYSIO CONN (PAD) ×1
PATCH CARTO3 (PAD) ×1
SHEATH FARADRIVE STEERABLE (SHEATH) ×1
SHEATH PINNACLE 8F 10CM (SHEATH) ×2
SHEATH PINNACLE 9F 10CM (SHEATH) ×1
SHEATH PROBE COVER 6X72 (BAG) ×1
SHEATH WIRE KIT BAYLIS SL1 (KITS) ×1
TUBING SMART ABLATE COOLFLOW (TUBING) ×1

## 2023-06-11 NOTE — Anesthesia Procedure Notes (Signed)
Procedure Name: Intubation Date/Time: 06/11/2023 8:08 AM  Performed by: Sandie Ano, CRNAPre-anesthesia Checklist: Patient identified, Emergency Drugs available, Suction available and Patient being monitored Patient Re-evaluated:Patient Re-evaluated prior to induction Oxygen Delivery Method: Circle System Utilized Preoxygenation: Pre-oxygenation with 100% oxygen Induction Type: IV induction Ventilation: Mask ventilation without difficulty Laryngoscope Size: Mac and 3 Grade View: Grade II Tube type: Oral Number of attempts: 1 Airway Equipment and Method: Stylet and Oral airway Placement Confirmation: ETT inserted through vocal cords under direct vision, positive ETCO2 and breath sounds checked- equal and bilateral Secured at: 22 cm Tube secured with: Tape Dental Injury: Teeth and Oropharynx as per pre-operative assessment

## 2023-06-11 NOTE — Progress Notes (Signed)
Pt ambulated to and from bathroom to void with no signs of oozing from bilateral groin sites  

## 2023-06-11 NOTE — Anesthesia Postprocedure Evaluation (Signed)
Anesthesia Post Note  Patient: Chase Hill  Procedure(s) Performed: ATRIAL FIBRILLATION ABLATION     Patient location during evaluation: PACU Anesthesia Type: General Level of consciousness: awake and alert Pain management: pain level controlled Vital Signs Assessment: post-procedure vital signs reviewed and stable Respiratory status: spontaneous breathing, nonlabored ventilation, respiratory function stable and patient connected to nasal cannula oxygen Cardiovascular status: blood pressure returned to baseline and stable Postop Assessment: no apparent nausea or vomiting Anesthetic complications: no   No notable events documented.  Last Vitals:  Vitals:   06/11/23 1230 06/11/23 1300  BP: (!) 135/97 (!) 148/94  Pulse: 69 72  Resp: 12 17  Temp:    SpO2: 96% 96%    Last Pain:  Vitals:   06/11/23 1121  TempSrc:   PainSc: 0-No pain                 Earl Lites P Yanett Conkright

## 2023-06-11 NOTE — Transfer of Care (Signed)
Immediate Anesthesia Transfer of Care Note  Patient: Chase Hill  Procedure(s) Performed: ATRIAL FIBRILLATION ABLATION  Patient Location: PACU  Anesthesia Type:General  Level of Consciousness: awake and alert   Airway & Oxygen Therapy: Patient Spontanous Breathing  Post-op Assessment: Report given to RN and Post -op Vital signs reviewed and stable  Post vital signs: Reviewed and stable  Last Vitals:  Vitals Value Taken Time  BP 136/89 06/11/23 1035  Temp 36.4 C 06/11/23 1035  Pulse 61 06/11/23 1038  Resp 13 06/11/23 1038  SpO2 94 % 06/11/23 1038  Vitals shown include unfiled device data.  Last Pain:  Vitals:   06/11/23 1035  TempSrc: Oral  PainSc: 0-No pain      Patients Stated Pain Goal: 5 (06/11/23 4098)  Complications: No notable events documented.

## 2023-06-11 NOTE — Discharge Instructions (Signed)

## 2023-06-11 NOTE — H&P (Signed)
Electrophysiology Office Follow up Visit Note:     Date:  06/11/2023    ID:  Chase Hill, DOB [birth year], MRN [id number]   PCP:  Garlan Fillers, MD            [facility] Cardiologist:  Peter Swaziland, MD  [facility] Electrophysiologist:  Lanier Prude, MD      Interval History:     Chase Hill is a 77 y.o. male who presents for a follow up visit. At his last appointment 01/2021 he was doing well with no recurrence of atrial fibrillation.   He was seen by Dr. Swaziland 11/22/2022. About 6 weeks prior he developed increased fatigue, shortness of breath, and was exhausted with walking up a flight of stairs. His EKG showed atrial flutter with V pacing, rate 64. First recurrence since 2021. He underwent DCCV 12/06/2022 and was recommended for EP follow-up to consider ablation. Intolerant of amiodarone in the past.   Today, he is accompanied by his wife. He states he is feeling okay, although he is usually not able to tell that he is in Afib beyond feeling more fatigued. His wife will notice that he becomes more short of breath and is somewhat more lethargic, in a daze.    He confirms that he did feel better after his cardioversion for about a month. He then recognized that he "didn't feel quite as good." Generally he is able to walk, and will breathe a little harder when reaching the top of a hill.   He denies any palpitations, chest pain, peripheral edema, lightheadedness, headaches, syncope, orthopnea, or PND.   Presents for PVI today. Procedure reviewed.   Objective Past medical, surgical, social, and family history were reviewed.   ROS:   Please see the history of present illness.    (+) Shortness of breath (+) Fatigue, lethargic All other systems reviewed and are negative.   EKGs/Labs/Other Studies Reviewed:     The following studies were reviewed today:   01/24/2023 In clinic device interrogation personally reviewed: Battery longevity 9.2 years Lead  parameter stable A-fib burden has been persistent since mid May.  He was free of atrial fibrillation for about 1 month after his cardioversion.   EKG:  EKG is personally reviewed.  01/24/2023: Atrial flutter with   Physical Exam:     VS:  BP 127/82   Pulse 65   Ht 5\' 11"  (1.803 m)   Wt 215 lb 6.4 oz (97.7 kg)   SpO2 97%   BMI 30.04 kg/m         Wt Readings from Last 3 Encounters:  01/24/23 215 lb 6.4 oz (97.7 kg)  12/06/22 205 lb (93 kg)  11/22/22 210 lb (95.3 kg)      GEN: Well nourished, well developed in no acute distress CARDIAC: Irregularly irregular, no murmurs, rubs, gallops. PPM pocket well healed. RESPIRATORY:  Clear to auscultation without rales, wheezing or rhonchi          Assessment ASSESSMENT:     1. Typical atrial flutter (HCC)   2. Persistent atrial fibrillation (HCC)   3. Complete heart block (HCC)   4. Pacemaker     PLAN:       # Persistent atrial fibrillation On Eliquis for stroke prophylaxis.  Symptomatic recurrence.  Maintain sinus rhythm for about 1 month after his cardioversion.  We discussed treatment options during today's clinic appointment.  He previously was on amiodarone and did not tolerate.  We discussed catheter ablation in  detail during today's appointment.  We discussed the risks, recovery and likelihood of success.  He would like to proceed with scheduling.   Discussed treatment options today for AF including antiarrhythmic drug therapy and ablation. Discussed risks, recovery and likelihood of success with each treatment strategy. Risk, benefits, and alternatives to EP study and radiofrequency ablation for afib were discussed. These risks include but are not limited to stroke, bleeding, vascular damage, tamponade, perforation, damage to the esophagus, lungs, phrenic nerve and other structures, pulmonary vein stenosis, worsening renal function, and death.  Discussed potential need for repeat ablation procedures and antiarrhythmic drugs  after an initial ablation. The patient understands these risk and wishes to proceed.  We will therefore proceed with catheter ablation at the next available time.  Carto, ICE, anesthesia are requested for the procedure.  Will also obtain CT PV protocol prior to the procedure to exclude LAA thrombus and further evaluate atrial anatomy.     Ablation strategy will be PVI plus posterior wall plus CTI   #HOCM Doing well. No obstructive symptoms today.  #Complete heart block post permanent pacemaker implant Pacemaker functioning well. Continue remote interrogations.     Presents for PVI today. Procedure reviewed.   Signed, Steffanie Dunn, MD, [facility], [facility] 06/11/2023 Electrophysiology [facility]

## 2023-06-11 NOTE — Progress Notes (Signed)
  Pt arrived from EP via Bed to HA. Report received from RN and CRNA. Pt vitals are stable, performed q50min see vitals flowsheet. Anesthesia recovery has been uneventful. Pt to transition to short stay for remainder of recovery. Will continue to monitor patient while under holding area care.

## 2023-06-12 ENCOUNTER — Encounter (HOSPITAL_COMMUNITY): Payer: Self-pay | Admitting: Cardiology

## 2023-06-20 ENCOUNTER — Ambulatory Visit: Payer: BLUE CROSS/BLUE SHIELD

## 2023-06-20 DIAGNOSIS — I422 Other hypertrophic cardiomyopathy: Secondary | ICD-10-CM

## 2023-06-20 LAB — CUP PACEART REMOTE DEVICE CHECK
Battery Remaining Longevity: 108 mo
Battery Voltage: 3.01 V
Brady Statistic AP VP Percent: 21.99 %
Brady Statistic AP VS Percent: 0.18 %
Brady Statistic AS VP Percent: 75.85 %
Brady Statistic AS VS Percent: 1.97 %
Brady Statistic RA Percent Paced: 22.68 %
Brady Statistic RV Percent Paced: 97.85 %
Date Time Interrogation Session: 20241105203934
Implantable Lead Connection Status: 753985
Implantable Lead Connection Status: 753985
Implantable Lead Implant Date: 20210604
Implantable Lead Implant Date: 20210604
Implantable Lead Location: 753859
Implantable Lead Location: 753860
Implantable Lead Model: 4076
Implantable Lead Model: 4076
Implantable Pulse Generator Implant Date: 20210604
Lead Channel Impedance Value: 266 Ohm
Lead Channel Impedance Value: 380 Ohm
Lead Channel Impedance Value: 418 Ohm
Lead Channel Impedance Value: 475 Ohm
Lead Channel Pacing Threshold Amplitude: 0.5 V
Lead Channel Pacing Threshold Amplitude: 0.75 V
Lead Channel Pacing Threshold Pulse Width: 0.4 ms
Lead Channel Pacing Threshold Pulse Width: 0.4 ms
Lead Channel Sensing Intrinsic Amplitude: 15 mV
Lead Channel Sensing Intrinsic Amplitude: 15 mV
Lead Channel Sensing Intrinsic Amplitude: 6.125 mV
Lead Channel Sensing Intrinsic Amplitude: 6.125 mV
Lead Channel Setting Pacing Amplitude: 1.5 V
Lead Channel Setting Pacing Amplitude: 2 V
Lead Channel Setting Pacing Pulse Width: 0.4 ms
Lead Channel Setting Sensing Sensitivity: 0.9 mV
Zone Setting Status: 755011
Zone Setting Status: 755011

## 2023-06-28 ENCOUNTER — Telehealth: Payer: Self-pay | Admitting: Cardiology

## 2023-06-28 NOTE — Telephone Encounter (Signed)
Spoke with Chase Hill from Radiology  She states  4.7 cm ascending aortic aneurysm, increased from 4.1 cm on 02/19/2019. Ascending thoracic aortic aneurysm. Recommend semi-annual imaging follow up.

## 2023-06-28 NOTE — Telephone Encounter (Signed)
Chase Hill - [facility] calling to give result from pt's CT

## 2023-07-09 ENCOUNTER — Ambulatory Visit (HOSPITAL_COMMUNITY)
Admission: RE | Admit: 2023-07-09 | Discharge: 2023-07-09 | Disposition: A | Discharge status: Home / Self Care | Payer: Medicare Other | Source: Ambulatory Visit | Attending: Physician Assistant | Admitting: Physician Assistant

## 2023-07-09 ENCOUNTER — Encounter (HOSPITAL_COMMUNITY): Payer: Self-pay | Admitting: Physician Assistant

## 2023-07-09 VITALS — BP 118/84 | HR 67 | Ht 71.0 in | Wt 214.0 lb

## 2023-07-09 DIAGNOSIS — Z7901 Long term (current) use of anticoagulants: Secondary | ICD-10-CM | POA: Insufficient documentation

## 2023-07-09 DIAGNOSIS — D6869 Other thrombophilia: Secondary | ICD-10-CM | POA: Diagnosis not present

## 2023-07-09 DIAGNOSIS — Z79899 Other long term (current) drug therapy: Secondary | ICD-10-CM | POA: Diagnosis not present

## 2023-07-09 DIAGNOSIS — I4892 Unspecified atrial flutter: Secondary | ICD-10-CM | POA: Insufficient documentation

## 2023-07-09 DIAGNOSIS — I421 Obstructive hypertrophic cardiomyopathy: Secondary | ICD-10-CM | POA: Diagnosis not present

## 2023-07-09 DIAGNOSIS — I4819 Other persistent atrial fibrillation: Secondary | ICD-10-CM | POA: Insufficient documentation

## 2023-07-09 DIAGNOSIS — Z950 Presence of cardiac pacemaker: Secondary | ICD-10-CM | POA: Diagnosis not present

## 2023-07-09 DIAGNOSIS — E785 Hyperlipidemia, unspecified: Secondary | ICD-10-CM | POA: Diagnosis not present

## 2023-07-09 NOTE — Progress Notes (Signed)
Primary Care Physician: Garlan Fillers, MD Primary Cardiologist: Peter Swaziland, MD Electrophysiologist: Lanier Prude, MD  Referring Physician: Dr Star Age is a 77 y.o. male with a history of HCM, DVT/PE, HLD, Gilbert's syndrome, hypothyroidism, atrial fibrillation who presents for follow up in the [facility]. Patient underwent a Septal myectomy on 01/14/20 by Dr Maryanna Shape. On 01/16/20 he had a left sided DDD pacemaker (Medtronic XT AZT MRI) placed for development of complete heart block. Was noted on pacemaker check to have paroxysmal AFib. He was started on amiodarone but did not tolerate due to tremors. Patient is on Eliquis for a CHADS2VASC score of 2.  He was seen by Dr Lalla Brothers 01/2023 and was in atrial flutter with symptoms of fatigue and SOB. He underwent afib and flutter ablation with Dr Lalla Brothers on 06/11/23.  On follow up today, patient reports that he has done very well since his ablation. He has not had any symptoms of afib in the interim. He denies any chest pain or groin issues.   Today, he denies symptoms of palpitations, chest pain, shortness of breath, orthopnea, PND, lower extremity edema, dizziness, presyncope, syncope, snoring, daytime somnolence, bleeding, or neurologic sequela. The patient is tolerating medications without difficulties and is otherwise without complaint today.    Atrial Fibrillation Risk Factors:  he does not have symptoms or diagnosis of sleep apnea. Negative sleep study 03/06/23. he does not have a history of rheumatic fever.   Atrial Fibrillation Management history:  Previous antiarrhythmic drugs: amiodarone (intolerant) Previous cardioversions: 12/06/22 Previous ablations: 06/11/23 Anticoagulation history: Eliquis  ROS- All systems are reviewed and negative except as per the HPI above.  Past Medical History:  Diagnosis Date   ALLERGIC RHINITIS 08/16/2009   Qualifier: Diagnosis of  By: Alwyn Ren MD,  Chrissie Noa   Onset:as a child Character: perennial but increased seasonally Triggers: dust mites; minor as pollen Allergy testing: yes X 2; as child & in 1995, Dr Jethro Bolus. Never took shots Maintenance medications/ response:Loratidine daily Smoking history:never Family history pulmonary disease: no      Cervical radiculopathy at C7    PMH of   Chest pain 11/22/2012   Evaluated by Dr Marca Ancona, Cardiology 2014 ECHO, MR : ? hypertrophic cardiomyopathy Rx: Beta blocker    Diverticulosis    colonoscopy 10-15-2006   DIVERTICULOSIS, COLON 08/16/2009   Qualifier: Diagnosis of  By: Alwyn Ren MD, Chrissie Noa   Dr Jarold Motto, GI     DVT (deep venous thrombosis) (HCC)    ELECTROCARDIOGRAM, ABNORMAL 08/16/2009   Qualifier: Diagnosis of  By: Alwyn Ren MD, William   Right bundle branch block, left posterior fascicular block( bifascicular block), not present 01/17/2007; noted 08/16/2009     GERD (gastroesophageal reflux disease)    Sullivan Lone syndrome    GILBERT'S SYNDROME 01/14/2007   Qualifier: Diagnosis of  By: Alwyn Ren MD, William     Hemorrhoids    Hyperlipidemia    Hypothyroidism    Obstructive hypertrophic cardiomyopathy (HCC) 01/15/2013   Perennial allergic rhinitis with seasonal variation    Pulmonary embolism (HCC)    REFLUX, ESOPHAGEAL 01/14/2007   Qualifier: Diagnosis of  By: Alwyn Ren MD, William     Thoracic aortic aneurysm without rupture (HCC) 03/20/2017   THYROID FUNCTION TEST, ABNORMAL 09/07/2010   Qualifier: Diagnosis of  By: Alwyn Ren MD, Chrissie Noa      Varicose veins of right lower extremity with complications 02/04/2018    Current Outpatient Medications  Medication Sig Dispense Refill  ELIQUIS 5 MG TABS tablet Take 5 mg by mouth 2 (two) times daily.     levothyroxine (SYNTHROID) 125 MCG tablet Take 125 mcg by mouth See admin instructions. Take 125 mcg daily except skip dose on Sundays     loratadine (CLARITIN) 10 MG tablet Take 10 mg by mouth daily after breakfast.      Multiple Vitamin (MULTIVITAMIN WITH  MINERALS) TABS tablet Take 1 tablet by mouth every evening.     pantoprazole (PROTONIX) 40 MG tablet Take 1 tablet (40 mg total) by mouth daily. 45 tablet 0   Psyllium (METAMUCIL PO) Take 1 capsule by mouth daily.     rosuvastatin (CRESTOR) 5 MG tablet Take 5 mg by mouth every Monday, Wednesday, and Friday.     colchicine 0.6 MG tablet Take 1 tablet (0.6 mg total) by mouth 2 (two) times daily for 5 days. 10 tablet 0   No current facility-administered medications for this encounter.    Physical Exam: BP 118/84   Pulse 67   Ht 5\' 11"  (1.803 m)   Wt 97.1 kg   BMI 29.85 kg/m   GEN: Well nourished, well developed in no acute distress NECK: No JVD; No carotid bruits CARDIAC: Regular rate and rhythm, no murmurs, rubs, gallops RESPIRATORY:  Clear to auscultation without rales, wheezing or rhonchi  ABDOMEN: Soft, non-tender, non-distended EXTREMITIES:  No edema; No deformity   Wt Readings from Last 3 Encounters:  07/09/23 97.1 kg  06/11/23 93 kg  03/13/23 97.1 kg     EKG today demonstrates  AV dual paced rhythm Vent. rate 67 BPM PR interval 178 ms QRS duration 184 ms QT/QTcB 500/528 ms   Echo 11/24/22 demonstrated   1. Left ventricular ejection fraction, by estimation, is 60 to 65%. The  left ventricle has normal function. The left ventricle has no regional  wall motion abnormalities. Abnormal (paradoxical) septal motion,  consistent with RV pacemaker.  There is moderate concentric left ventricular hypertrophy. Left ventricular diastolic parameters are indeterminate. No significant LV outflow gradient and no MV SAM.   2. Right ventricular systolic function is normal. The right ventricular  size is normal. There is normal pulmonary artery systolic pressure. The  estimated right ventricular systolic pressure is 20.1 mmHg.   3. Left atrial size was severely dilated.   4. The mitral valve is normal in structure. Mild mitral valve  regurgitation. No evidence of mitral stenosis.   5.  The aortic valve is normal in structure. Aortic valve regurgitation is  mild to moderate. No aortic stenosis is present. Aortic regurgitation PHT  measures 581 msec.   6. Aortic dilatation noted. There is mild dilatation of the aortic root,  measuring 44 mm. There is moderate dilatation of the ascending aorta,  measuring 46 mm.   7. The inferior vena cava is normal in size with greater than 50%  respiratory variability, suggesting right atrial pressure of 3 mmHg.    CHA2DS2-VASc Score = 2  The patient's score is based upon: CHF History: 0 HTN History: 0 Diabetes History: 0 Stroke History: 0 Vascular Disease History: 0 Age Score: 2 Gender Score: 0       ASSESSMENT AND PLAN: Persistent Atrial Fibrillation/atrial flutter The patient's CHA2DS2-VASc score is 2, indicating a 2.2% annual risk of stroke.   Previously failed amiodarone  S/p afib and flutter ablation 06/11/23 Patient appears to be maintaining SR Continue Eliquis 5 mg BID with no missed doses for 3 months post ablation  Secondary Hypercoagulable State (ICD10:  Y19.50) The patient is at significant risk for stroke/thromboembolism based upon his CHA2DS2-VASc Score of 2.  Continue Apixaban (Eliquis).   HOCM S/p myectomy 2021  CHB S/p PPM, followed by Dr Lalla Brothers and the device clinic    Follow up with Francis Dowse as scheduled.     Jorja Loa PA-C Afib Clinic [facility] [address] [place], [place] [address] [phone]

## 2023-07-10 NOTE — Progress Notes (Signed)
Remote pacemaker transmission.   

## 2023-08-03 ENCOUNTER — Telehealth: Payer: Self-pay

## 2023-08-03 NOTE — Telephone Encounter (Signed)
Spoke to patient follow up appointment scheduled with Dr.Jordan 09/17/23 at 8:00 am.Advised he will need a chest ct with contrast scheduled in 11/2023.We can schedule at appointment.

## 2023-08-20 ENCOUNTER — Other Ambulatory Visit (HOSPITAL_COMMUNITY): Payer: Self-pay | Admitting: Urology

## 2023-08-20 ENCOUNTER — Ambulatory Visit (HOSPITAL_COMMUNITY)
Admission: RE | Admit: 2023-08-20 | Discharge: 2023-08-20 | Disposition: A | Discharge status: Home / Self Care | Payer: Medicare Other | Source: Ambulatory Visit | Attending: Urology | Admitting: Urology

## 2023-08-20 DIAGNOSIS — R972 Elevated prostate specific antigen [PSA]: Secondary | ICD-10-CM

## 2023-08-20 DIAGNOSIS — N402 Nodular prostate without lower urinary tract symptoms: Secondary | ICD-10-CM

## 2023-08-20 MED ORDER — GADOBUTROL 1 MMOL/ML IV SOLN
9.0000 mL | Freq: Once | INTRAVENOUS | Status: AC | PRN
Start: 2023-08-20 — End: 2023-08-20
  Administered 2023-08-20: 9 mL via INTRAVENOUS

## 2023-08-29 ENCOUNTER — Telehealth: Payer: Self-pay

## 2023-08-29 NOTE — Telephone Encounter (Signed)
   Name: Chase Hill  DOB: 15-Nov-1945  MRN: [id number]  Primary Cardiologist: Peter Swaziland, MD  Chart reviewed as part of pre-operative protocol coverage. The patient has an upcoming visit scheduled with Dagmar Drones, PA on 09/11/2023 at which time clearance can be addressed in case there are any issues that would impact surgical recommendations.  MRI fusion prostate biopsy is not scheduled until date to be determined as below. I added preop FYI to appointment note so that provider is aware to address at time of outpatient visit.  Per office protocol the cardiology provider should forward their finalized clearance decision and recommendations regarding antiplatelet therapy to the requesting party below.    This message will also be routed to pharmacy pool and/or Aundra Lee, [place] for input on holding Eliquis  as requested below so that this information is available to the clearing provider at time of patient's appointment.   I will route this message as FYI to requesting party and remove this message from the preop box as separate preop APP input not needed at this time.   Please call with any questions.  Friddie Jetty, NP  08/29/2023, 12:05 PM

## 2023-08-29 NOTE — Telephone Encounter (Signed)
   Pre-operative Risk Assessment    Patient Name: Chase Hill  DOB: [birth year] MRN: [id number]   Date of last office visit: 03/13/23 Date of next office visit: 09/11/23   Request for Surgical Clearance    Procedure:   MRI Fusion Prostate Biopsy  Date of Surgery:  Clearance TBD                                Surgeon:  Dr. Salli Crawley Surgeon's Group or Practice Name:  [facility] Phone number:  [phone] Fax number:  [phone]   Type of Clearance Requested:   - Medical  - Pharmacy:  Hold Apixaban  (Eliquis ) x3 days prior to procedure   Type of Anesthesia:  Not Indicated   Additional requests/questions:    Gardiner Jumper   08/29/2023, 8:07 AM

## 2023-08-30 NOTE — Telephone Encounter (Signed)
I will update all parties involved pt has appt 09/11/23 with Francis Dowse, PAC. Per pre op APP defer clearance to [facility] 09/11/23.

## 2023-09-08 NOTE — Progress Notes (Unsigned)
Cardiology Office Note:  .   Date:  09/08/2023  ID:  Chase Hill, DOB 19-Apr-1946, MRN [id number] PCP: Garlan Fillers, MD  [facility] Providers Cardiologist:  Peter Swaziland, MD Electrophysiologist:  Lanier Prude, MD {  History of Present Illness: .   Chase Hill is a 78 y.o. 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 -- Subsequent cardiac cath showed no obstructive disease  -- Cardiac MRI obtained in June 2014 showed mild focal basal septal hypertrophy with systolic anterior motion of the mitral valve and mild MR.  There was no significant delayed enhancement.  >>> started on diltiazem >> stopped 2/2 concern of conduction disease with first-degree AV block, left anterior fascicular block and a right bundle branch block  -- 04/24/2019, TTE w/EF was greater than 65%, severe LVH, significant outflow obstruction at the mid cavity and LVOT with peak gradient of at least 100 mmHg, systolic anterior motion of mitral leaflet, mild dilatation of the ascending aorta measuring 43 mm  -- cardiac cath on 06/26/19. This showed normal coronary anatomy. Normal LV and right heart pressures. LVOT gradient 20 mm Hg by cath. MRI was also done showing typical features of HOCM with predominant sigmoid septum and SAM. There was only mild late gadolinium enhancement.  referred to [facility] and underwent a Septal myectomy on 01/14/20 by Dr Maryanna Shape.  -- June 2021, PE, multilobe for segmental and subsegmental thrombo emboli affecting all lobes  Developed thrombocytopenia w/heparin gtt HIT positive >> bival LE doppler showed extensive DVT in left leg from the common femoral vein down   Saw Dr. Swaziland July 2024, "about the same", DOE, had an episode of blurred vision watching TV,  brief, <minute and not recurrent Discussed 01/19/20 showed no outflow gradient and trivial MR. Normal EF. Echo this year was stable.   PVI/CTI ablation 06/11/23  Pending MRI Fusion Prostate Biopsy  Not yet scheduled Needs 3 days off [facility]  Today's visit is scheduled as a "4 week post ablation visit and pre-op evaluation"  RCRI score is zero (0.4%)  ROS:   *** MRI >> will need device rep available for pre-procedure device management *** device compatible? Measurements OK *** CXR is OK *** should really defer to cardiology I think for his overall risk assessment??? *** cath 2020 without CAD *** echo 2024 per [facility] > Echo shows normal EF. LVH with known hypertrophic heart. No outflow gradient. Only mild MR. LAE  *** exactly 80mo post ablation *** symptoms, any reason not to proceed? *** eliquis, bleeding, labs, dose *** procedure complications? *** burden post ablation   Device information MDT dual chamber PPM implanted 01/16/2020  Arrhythmia/AAD hx AFib is mentioned in notes, noted via device 2021 AFlutter burden more so 2024 Amiodarone started 2021 >> tremors, difficulty writing and typing, and imbalance >> stopped Aug 2021 06/11/23: PVI, (PFA) CTI ablation  Studies Reviewed: Marland Kitchen    EKG done today and reviewed by myself:  ***  DEVICE interrogation done today and reviewed by myself *** Battery and lead measurements are good ***   06/11/23: EPS/ablation CONCLUSIONS: 1. Successful PVI 2. Successful ablation/isolation of the posterior wall 3.  Successful ablation of the cavotricuspid isthmus for typical atrial flutter 4. Intracardiac echo reveals trivial pericardial effusion, normal left atrial architecture 5. No early apparent complications.  6. Colchicine 0.6mg  PO BID x 5 days 7. Protonix 40mg  PO daily x 45 days   11/24/22: TTE 1. Left ventricular ejection fraction, by estimation, is 60 to 65%. The  left ventricle has normal function. The left ventricle has no regional   wall motion abnormalities. Abnormal (paradoxical) septal motion,  consistent with RV pacemaker.      There is moderate concentric left ventricular hypertrophy. Left  ventricular diastolic parameters are indeterminate. No significant LV  outflow gradient and no MV SAM.   2. Right ventricular systolic function is normal. The right ventricular  size is normal. There is normal pulmonary artery systolic pressure. The  estimated right ventricular systolic pressure is 20.1 mmHg.   3. Left atrial size was severely dilated.   4. The mitral valve is normal in structure. Mild mitral valve  regurgitation. No evidence of mitral stenosis.   5. The aortic valve is normal in structure. Aortic valve regurgitation is  mild to moderate. No aortic stenosis is present. Aortic regurgitation PHT  measures 581 msec.   6. Aortic dilatation noted. There is mild dilatation of the aortic root,  measuring 44 mm. There is moderate dilatation of the ascending aorta,  measuring 46 mm.   7. The inferior vena cava is normal in size with greater than 50%  respiratory variability, suggesting right atrial pressure of 3 mmHg.   06/26/2019: Cath The left ventricular systolic function is normal. LV end diastolic pressure is normal. The left ventricular ejection fraction is 55-65% by visual estimate.   1. Normal coronary anatomy 2. Normal LV function' 3. 20 mm Hg LVOT gradient at rest. Typical [place] sign post PVC. 4. Normal/low LV filling pressures 5. Normal right  Heart pressures 6. Normal cardiac output 7. Moderate MR but post PVC   Risk Assessment/Calculations:    Physical Exam:   VS:  There were no vitals taken for this visit.   Wt Readings from Last 3 Encounters:  07/09/23 214 lb (97.1 kg)  06/11/23 205 lb (93 kg)  03/13/23 214 lb (97.1 kg)    GEN: Well nourished, well developed in no acute distress NECK: No JVD; No carotid bruits CARDIAC: ***RRR, no murmurs, rubs, gallops RESPIRATORY:  *** CTA  b/l without rales, wheezing or rhonchi  ABDOMEN: Soft, non-tender, non-distended EXTREMITIES:  *** No edema; No deformity   PPM site: *** is stable, no thinning, fluctuation, tethering  ASSESSMENT AND PLAN: .    persistent AFib AFlutter (typical) CHA2DS2Vasc is 4, on Eliquis, *** appropriately dosed *** burden *** no post procedural complications/concerns  PPM *** intact function *** no programming changes made  HCM Long hx as above Last echo without gradient ***  Secondary hypercoagulable state AFib Hx of recurrent DVT/PE  6.   Pre-op assessment Appears to be MRI will be involved>>>  From a device perspective: *** is MRI conditional system *** will need device rep available/pacer managed pre-post MRI/procedure From an [facility] perspective: He is now at his 3 mo post ablation mark Would be acceptable to interrupt OAC briefly to resume as soon as safe post procedure Otherwise: Prostate surgery felt to be a low cardiac risk procedure His RCRI score is low (0.4%) though clinically perhaps given his hx slightly higher then that He has no CAD by cath 2020 Preserved LVEF without LVOT gradient o his echo < a year ago *** symptoms to suggest clinical changes      {Are you ordering a CV Procedure (e.g. stress test, cath, DCCV, TEE, etc)?  Press F2        :[id number]}     Dispo: ***  Signed, Sheilah Pigeon, PA-C

## 2023-09-11 ENCOUNTER — Ambulatory Visit: Payer: Medicare Other | Attending: Physician Assistant | Admitting: Physician Assistant

## 2023-09-11 VITALS — BP 124/80 | HR 66 | Ht 71.0 in | Wt 214.8 lb

## 2023-09-11 DIAGNOSIS — D6869 Other thrombophilia: Secondary | ICD-10-CM | POA: Diagnosis not present

## 2023-09-11 DIAGNOSIS — I4819 Other persistent atrial fibrillation: Secondary | ICD-10-CM | POA: Insufficient documentation

## 2023-09-11 DIAGNOSIS — Z01818 Encounter for other preprocedural examination: Secondary | ICD-10-CM | POA: Insufficient documentation

## 2023-09-11 DIAGNOSIS — Z950 Presence of cardiac pacemaker: Secondary | ICD-10-CM | POA: Diagnosis present

## 2023-09-11 NOTE — Patient Instructions (Signed)
Medication Instructions:   Your physician recommends that you continue on your current medications as directed. Please refer to the Current Medication list given to you today.   *If you need a refill on your cardiac medications before your next appointment, please call your pharmacy*   Lab Work:  NONE ORDERED  TODAY     If you have labs (blood work) drawn today and your tests are completely normal, you will receive your results only by: MyChart Message (if you have MyChart) OR A paper copy in the mail If you have any lab test that is abnormal or we need to change your treatment, we will call you to review the results.    Follow-Up:   NONE ORDERED  TODAY    At [facility], you and your health needs are our priority.  As part of our continuing mission to provide you with exceptional heart care, we have created designated Provider Care Teams.  These Care Teams include your primary Cardiologist (physician) and Advanced Practice Providers (APPs -  Physician Assistants and Nurse Practitioners) who all work together to provide you with the care you need, when you need it.  We recommend signing up for the patient portal called "MyChart".  Sign up information is provided on this After Visit Summary.  MyChart is used to connect with patients for Virtual Visits (Telemedicine).  Patients are able to view lab/test results, encounter notes, upcoming appointments, etc.  Non-urgent messages can be sent to your provider as well.   To learn more about what you can do with MyChart, go to [email or web address].    Your next appointment:    6 month(s)   Provider:    You may see Lanier Prude, MD or one of the following Advanced Practice Providers on your designated Care Team:   Francis Dowse, [place] [address] [place], [place] Sherie Don, NP Canary Brim, NP   Other Instructions

## 2023-09-13 NOTE — Progress Notes (Unsigned)
Date:  09/17/2023   ID:  Chase Hill, DOB January 17, 1946, MRN [id number]  PCP:  Garlan Fillers, MD  Cardiologist:  Kitt Ledet [place], MD  Electrophysiologist:  Lanier Prude, MD   Evaluation Performed:  Follow-Up Visit   Chief Complaint:  HOCM  History of Present Illness:    Chase Hill is a 78 y.o. male with PMH of hypertrophic cardiomyopathy, history of DVT/PE, TAA, hyperlipidemia, RBBB, Gilbert's syndrome, hypothyroidism.   Stress Myoview in 2011 showed inferior and apical ischemia.  Subsequent cardiac cath showed no obstructive disease.  In 2014, echocardiogram demonstrated basal septal hypertrophy with mild LV outflow tract gradient and chordal versus valvular SAM, moderate diastolic dysfunction.  Cardiac MRI obtained in June 2014 showed mild focal basal septal hypertrophy with systolic anterior motion of the mitral valve and mild MR.  There was no significant delayed enhancement.  He has been intolerant of pravastatin due to myalgia.  He also developed  fatigue and orthostasis on beta-blocker.   Due to increased septal hypertrophy and outflow gradient on previous echo in July 2018, he was started on Cardizem.  This was later discontinued due to concern of conduction disease with first-degree AV block, left anterior fascicular block and a right bundle branch block. He later underwent laser ablation of varicose veins by Dr. Darrick Penna. He was diagnosed with PE on CT angiogram of the chest in January 2019 and was placed on Xarelto.  PE was felt to be provoked by laser vein treatment.  He completed 6 months of therapy with Xarelto and switch to aspirin.  CT angiogram of the chest recently showed 4.6 cm thoracic aortic aneurysm.  Recent lower extremity venous Doppler obtained on 04/10/2019 was negative for DVT.  Repeat echocardiogram obtained on 04/24/2019, EF was greater than 65%, severe LVH, significant outflow obstruction at the mid cavity and  LVOT with peak gradient of at least 100 mmHg,  systolic anterior motion of mitral leaflet, mild dilatation of the ascending aorta measuring 43 mm.   We did proceed with cardiac cath on 06/26/19. This showed normal coronary anatomy. Normal LV and right heart pressures. LVOT gradient 20 mm Hg by cath. MRI was also done showing typical features of HOCM with predominant sigmoid septum and SAM. There was only mild late gadolinium enhancement. An Event monitor was placed with results noted below.  He was subsequently referred to [facility] and underwent a Septal myectomy on 01/14/20 by Dr Maryanna Shape. On 01/16/20 he had a left sided DDD pacemaker (Medtronic XT AZT MRI) placed for development of complete heart block. He did well and was DC on 01/20/20. He was readmitted 6/24-6/29/21 with acute pulmonary embolus. He had a 10 day history of increased SOB. Was noted on pacemaker check to have paroxysmal AFib. Was started on Eliquis but symptoms progressed and he presented to the ED.EKG showed atrial flutter. Chest x-ray showed minimal fibrosis/atelectasis in left lung base, otherwise clear. Chest CT showed acute multilobe for segmental and subsegmental thrombo emboli affecting all lobes. Chest CT and bedside echocardiogram did not reveal evidence of right heart strain. The patient received IV fluids, loading dose of heparin and was initiated on high-intensity heparin GTT. The following morning the patient did have a dip in his platelets and vascular Medicine had concern for possible HIT, which was positive. The patient was transitioned to bivalirudin. Lupus anticoagulant was negative. Upon discharge the patient transition back to apixaban 10 mg b.i.d. x7 days and then 5 mg b.i.d. x3 months or indefinitely for  atrial fibrillation. LE doppler showed extensive DVT in left leg from the common femoral vein down.The patient's outpatient cardiologist, Dr. Gwendalyn Ege, was contacted regarding starting amiodarone while the patient was hospitalized. He recommended initiating amiodarone 200 mg  b.i.d. x7 days followed by 200 mg daily with Cardiology follow up.  Since then he has had persistent Afib. Was seen by Dr Lalla Brothers who initially increased his amiodarone. He then developed side effects on the amiodarone with tremors, difficulty writing and typing,  and imbalance. He had DCCV on 04/12/20 and amiodarone was discontinued. Last pacer check in May 2023 showed Afib burden < 0.1%. In June 2022 Venous dopplers done at that time due to swelling and were unchanged from before with left femoral and popliteal chronic DVT.   Echo done 03/15/21 results as noted below.   He was seen in April. He had noted increased fatigue and dyspnea with exertion. No real palpitations. No chest pain.  Still working. No chest pain or palpitations. His energy level is good. On his last visit he was found to be back in Aflutter. Echo looked good. He underwent DCCV on April 24. Maintained NSR for about a month then went back into flutter. He was seen by Dr Lalla Brothers and underwent ablation on October 28. When seen recently by EP he was maintaining NSR.   On follow up today he notes a focal pain in his mid back. This is constant and annoying. Changes some with position but still able to play golf. Also complains of intermittent sharp pain that radiates from his left neck up into his temple. He states he needs a prostate biopsy. No chest pain, dyspnea, palpitations. Energy is good.     Past Medical History:  Diagnosis Date   ALLERGIC RHINITIS 08/16/2009   Qualifier: Diagnosis of  By: Alwyn Ren MD, Chrissie Noa   Onset:as a child Character: perennial but increased seasonally Triggers: dust mites; minor as pollen Allergy testing: yes X 2; as child & in 1995, Dr Jethro Bolus. Never took shots Maintenance medications/ response:Loratidine daily Smoking history:never Family history pulmonary disease: no      Cervical radiculopathy at C7    PMH of   Chest pain 11/22/2012   Evaluated by Dr Marca Ancona, Cardiology 2014 ECHO, MR : ?  hypertrophic cardiomyopathy Rx: Beta blocker    Diverticulosis    colonoscopy 10-15-2006   DIVERTICULOSIS, COLON 08/16/2009   Qualifier: Diagnosis of  By: Alwyn Ren MD, Chrissie Noa   Dr Jarold Motto, GI     DVT (deep venous thrombosis) (HCC)    ELECTROCARDIOGRAM, ABNORMAL 08/16/2009   Qualifier: Diagnosis of  By: Alwyn Ren MD, William   Right bundle branch block, left posterior fascicular block( bifascicular block), not present 01/17/2007; noted 08/16/2009     GERD (gastroesophageal reflux disease)    Sullivan Lone syndrome    GILBERT'S SYNDROME 01/14/2007   Qualifier: Diagnosis of  By: Alwyn Ren MD, William     Hemorrhoids    Hyperlipidemia    Hypothyroidism    Obstructive hypertrophic cardiomyopathy (HCC) 01/15/2013   Perennial allergic rhinitis with seasonal variation    Pulmonary embolism (HCC)    REFLUX, ESOPHAGEAL 01/14/2007   Qualifier: Diagnosis of  By: Alwyn Ren MD, William     Thoracic aortic aneurysm without rupture (HCC) 03/20/2017   THYROID FUNCTION TEST, ABNORMAL 09/07/2010   Qualifier: Diagnosis of  By: Alwyn Ren MD, Chrissie Noa      Varicose veins of right lower extremity with complications 02/04/2018   Past Surgical History:  Procedure Laterality Date   ATRIAL FIBRILLATION ABLATION  N/A 06/11/2023   Procedure: ATRIAL FIBRILLATION ABLATION;  Surgeon: Lanier Prude, MD;  Location: [facility] INVASIVE CV LAB;  Service: Cardiovascular;  Laterality: N/A;   CARDIOVERSION N/A 04/12/2020   Procedure: CARDIOVERSION;  Surgeon: Jodelle Red, MD;  Location: [facility] ENDOSCOPY;  Service: Cardiovascular;  Laterality: N/A;   CARDIOVERSION N/A 12/06/2022   Procedure: CARDIOVERSION;  Surgeon: Chilton Si, MD;  Location: [facility] INVASIVE CV LAB;  Service: Cardiovascular;  Laterality: N/A;   COLONOSCOPY  2010   Tics   ENDOVENOUS ABLATION SAPHENOUS VEIN W/ LASER Right 08/21/2018   endovenous laser ablation right greater saphenous vein and stab phlebectomy > 20 incisions right leg by Fabienne Bruns MD    RIGHT/LEFT HEART CATH AND  CORONARY ANGIOGRAPHY N/A 06/26/2019   Procedure: RIGHT/LEFT HEART CATH AND CORONARY ANGIOGRAPHY;  Surgeon: [place], Jannell Franta M, MD;  Location: [facility] INVASIVE CV LAB;  Service: Cardiovascular;  Laterality: N/A;   WISDOM TOOTH EXTRACTION       Current Meds  Medication Sig   ELIQUIS 5 MG TABS tablet Take 5 mg by mouth 2 (two) times daily.   levothyroxine (SYNTHROID) 125 MCG tablet Take 125 mcg by mouth See admin instructions. Take 125 mcg daily except skip dose on Sundays   loratadine (CLARITIN) 10 MG tablet Take 10 mg by mouth daily after breakfast.    Multiple Vitamin (MULTIVITAMIN WITH MINERALS) TABS tablet Take 1 tablet by mouth every evening.   Psyllium (METAMUCIL PO) Take 1 capsule by mouth daily.   rosuvastatin (CRESTOR) 5 MG tablet Take 5 mg by mouth every Monday, Wednesday, and Friday.     Allergies:   Dust mite extract and Heparin   Social History   Tobacco Use   Smoking status: Never   Smokeless tobacco: Never   Tobacco comments:    Never smoked 07/09/23  Vaping Use   Vaping status: Never Used  Substance Use Topics   Alcohol use: Yes    Alcohol/week: 1.0 standard drink of alcohol    Types: 1 Glasses of wine per week    Comment:  < 3 /week , only socially   Drug use: No     Family Hx: The patient's family history includes Breast cancer in his mother; Congestive Heart Failure in his father; Coronary artery disease in his father and paternal aunt; Stroke (age of onset: 69) in his maternal grandmother. There is no history of Diabetes, Hypertension, Colon cancer, or Sleep apnea.  ROS:   Please see the history of present illness.    All other systems reviewed and are negative.   Prior CV studies:   The following studies were reviewed today:  CTA of chest 02/19/2019 IMPRESSION: 1. No change in caliber of the tubular ascending thoracic aorta, measuring 4.6 x 4.3 cm, not significantly changed in caliber, measuring 4.5 x 4.2 cm on examinations dating back to 03/23/2017. The  sinuses of Valsalva measure up to 4.4 cm in caliber. Normal caliber of the aortic valve. The descending thoracic aorta is normal in caliber. No significant atherosclerosis.   2. Examination is not tailored for the evaluation of pulmonary embolism, however there is no obvious residual pulmonary embolism in the right lower lobe at the site of embolism seen on prior examination dated 09/11/2018.   3.  Stable, benign small pulmonary nodules.     Venous doppler 04/10/2019 Summary: Right: Successful vein closure. There is no evidence of deep vein thrombosis in the lower extremity. No cystic structure found in the popliteal fossa. No significant change compared to previous  study. Left: No evidence of common femoral vein obstruction.   *See table(s) above for measurements and observations.     Echo 04/24/2019 IMPRESSIONS  1. The left ventricle has hyperdynamic systolic function, with an ejection fraction of >65%. The cavity size was normal. There is severely increased left ventricular wall thickness. Left ventricular diastolic Doppler parameters are consistent with  impaired relaxation.  2. LVOT is narrowed The LV is severely hypertrophied. There is significant outflow obstruction at mid cavity and through LVOT with peak gradient at least 100 mm Hg. All consistent with HOCM.  3. The right ventricle has normal systolic function. The cavity was normal. There is no increase in right ventricular wall thickness.  4. Left atrial size was mildly dilated.  5. There is systolic anterior motion of mitral leaflets.  6. The aortic valve is tricuspid. Mild thickening of the aortic valve. Aortic valve regurgitation is mild by color flow Doppler.  7. The aorta is abnormal unless otherwise noted.  8. The ascending aorta is normal in size and structure.  9. There is mild dilatation of the ascending aorta measuring 43 mm.    Cardiac cath 06/26/19:  RIGHT/LEFT HEART CATH AND CORONARY ANGIOGRAPHY   Conclusion    The left ventricular systolic function is normal. LV end diastolic pressure is normal. The left ventricular ejection fraction is 55-65% by visual estimate.   1. Normal coronary anatomy 2. Normal LV function' 3. 20 mm Hg LVOT gradient at rest. Typical Brockenbrough sign post PVC. 4. Normal/low LV filling pressures 5. Normal right  Heart pressures 6. Normal cardiac output 7. Moderate MR but post PVC   Plan: recommend further evaluation with cardiac MRI and event monitor.      CLINICAL DATA:  Hypertrophic cardiomyopathy suspected, further testing   COMPARISON: No images available from prior cardiac MRI for comparison   EXAM: CARDIAC MRI   TECHNIQUE: The patient was scanned on a 1.5 Tesla GE magnet. A dedicated cardiac coil was used. Functional imaging was done using Fiesta sequences. 2,3, and 4 chamber views were done to assess for RWMA's. Modified Simpson's rule using a short axis stack was used to calculate an ejection fraction on a dedicated work [employer]. The patient received 10mL GADAVIST GADOBUTROL 1 MMOL/ML IV SOLN. After 10 minutes inversion recovery sequences were used to assess for infiltration and scar tissue.   CONTRAST:  10mL GADAVIST GADOBUTROL 1 MMOL/ML IV SOLN   FINDINGS: LEFT VENTRICLE:   Normal left ventricular size. Hyperdynamic systolic function (LVEF = 69%).   There are no regional wall motion abnormalities.   Late gadolinium enhancement in the left ventricular myocardium is seen in the inferior RV insertion point at the mid ventricle and apex. This finding is seen in an area of increased myocardial wall thickness. Differential diagnosis includes fibrosis secondary to hypertrophic cardiomyopathy vs increased pulmonary pressures.   Normal T1 myocardial nulling kinetics suggest against a diagnosis of cardiac amyloidosis.   Maximal wall thickness: 18 mm   Location: Basal septum.   There is systolic  anterior motion of the mitral valve. Flow dephasing suggests left ventricular outflow tract obstruction.   Mitral regurgitation is present and posteriorly directed.   No evidence of left ventricular apical aneurysm.   Findings are consistent with hypertrophic cardiomyopathy with obstruction. Morphologic subtype: Sigmoid subtype.   RIGHT VENTRICLE:   Normal right ventricular size, thickness and systolic function (RVEF = 63%).   There are no regional wall motion abnormalities.   No delayed myocardial enhancement  in the right ventricle.   ATRIA:   Mildly dilated left atrial chamber size.  normal right atrial size.   VALVES:   Systolic anterior motion of the mitral valve with posteriorly directed mitral regurgitation, qualitatively mild.   Qualitatively trivial aortic valve regurgitation by flow dephasing.   PERICARDIUM:   Normal pericardium.  No pericardial effusion.   AORTA:   Mid ascending aorta measures 43 mm in a sagittal transverse plane. Orthogonal measurements unable to be performed for true axial measurement.   OTHER:   MEASUREMENTS:   LVEDV: 145 ml   LVESV: 45 ml   SV: 100 ml   CO: 6 L/min   Myocardial mass: 184 g   RVEDV: 100 ml   RVEDS: 38 ml   RVSV: 63 mL   IMPRESSION: 1.  Hyperdynamic left ventricular systolic function.  LVEF 69%.   2.  Normal right ventricular chamber size and function.  RVEF 63%.   3. Findings consistent with hypertrophic cardiomyopathy, sigmoid subtype, maximal wall thickness 18 mm at the basal left ventricular septum.   4. Left ventricular outflow tract obstruction. Flow dephasing seen in the LVOT with systolic anterior motion of the mitral valve, and posteriorly directed mitral valve regurgitation.   5. Delayed myocardial enhancement is seen in an area of increased left ventricular wall thickness at the mid ventricle, at the inferior RV insertion point. This finding could represent fibrosis in the setting  hypertrophic cardiomyopathy vs. increased pulmonary pressures.   6.  Mid ascending aorta measures 43 mm in a transverse plane.     Electronically Signed   By: Weston Brass  Event monitor 08/19/19.Study Highlights  Normal sinus rhythm with first degree AV block Episodes of second degree AV block Mobitz type 1 PVCs. One run of AIVR PACs with short bursts of PACs- longest 6 beats. No Afib noted- episodes noted on monitor appear to be artifact.   Echo 01/19/20:Echocardiogram 01/19/2020 Final Impressions 1. Hypertrophic cardiomyopathy status post left ventricular septal myectomy (14-Jan-2020). 2. Systolic anterior motion of mitral apparatus with trivial mitral valve regurgitation. No dynamic left ventricular outflow tract obstruction at rest. 3. Mild aortic valve regurgitation (central jet). ERO 0.07 cm2, regurgitant volume 19 mL. 4. Normal left ventricular chamber size, calculated 2-D linear ejection fraction 70 %. 5. Normal right ventricular chamber size, mildly reduced systolic function, estimated right ventricular systolic pressure 22 mmHg (systolic blood pressure 129 mmHg). 6. Mildly enlarged mid ascending aorta diameter (diameter 45 mm at mid level). Upper limits of normal for age, sex, and BSA = 43 mm. 7. No pericardial effusion.    Echo 01/19/20: Final Impressions  1. Hypertrophic cardiomyopathy status post left ventricular septal myectomy (14-Jan-2020).  2. Systolic anterior motion of mitral apparatus with trivial mitral valve regurgitation. No  dynamic left ventricular outflow tract obstruction at rest.  3. Mild aortic valve regurgitation (central jet). ERO 0.07 cm^2, regurgitant volume 19 mL.  4. Normal left ventricular chamber size, calculated 2-D linear ejection fraction 70 %.  5. Normal right ventricular chamber size, mildly reduced systolic function, estimated right  ventricular systolic pressure 22 mmHg (systolic blood pressure 129 mmHg).  6. Mildly enlarged mid ascending  aorta diameter (diameter 45 mm at mid level).  Upper limits of  normal for age, sex, and BSA = 43 mm.  7. No pericardial effusion.  Echo : 03/15/21: IMPRESSIONS     1. Left ventricular ejection fraction, by estimation, is 55 to 60%. The  left ventricle has normal function. The left ventricle has  no regional  wall motion abnormalities but there is septal-lateral dyssynchrony  consistent with RV pacing. There is mild  left ventricular hypertrophy. Left ventricular diastolic parameters are  consistent with Grade I diastolic dysfunction (impaired relaxation). There  is no significant LV outflow gradient noted and no mitral valve SAM noted.   2. Right ventricular systolic function is normal. The right ventricular  size is normal. Tricuspid regurgitation signal is inadequate for assessing  PA pressure.   3. The mitral valve is normal in structure. Trivial mitral valve  regurgitation. No evidence of mitral stenosis.   4. The aortic valve is tricuspid. Aortic valve regurgitation is mild to  moderate. Mild aortic valve sclerosis is present, with no evidence of  aortic valve stenosis.   5. Aortic dilatation noted. There is moderate dilatation of the ascending  aorta, measuring 46 mm.   6. The inferior vena cava is normal in size with greater than 50%  respiratory variability, suggesting right atrial pressure of 3 mmHg.   Comparison(s): 04/24/19 EF >65%.   Echo 11/24/22: IMPRESSIONS     1. Left ventricular ejection fraction, by estimation, is 60 to 65%. The  left ventricle has normal function. The left ventricle has no regional  wall motion abnormalities. Abnormal (paradoxical) septal motion,  consistent with RV pacemaker.      There is moderate concentric left ventricular hypertrophy. Left  ventricular diastolic parameters are indeterminate. No significant LV  outflow gradient and no MV SAM.   2. Right ventricular systolic function is normal. The right ventricular  size is normal. There is  normal pulmonary artery systolic pressure. The  estimated right ventricular systolic pressure is 20.1 mmHg.   3. Left atrial size was severely dilated.   4. The mitral valve is normal in structure. Mild mitral valve  regurgitation. No evidence of mitral stenosis.   5. The aortic valve is normal in structure. Aortic valve regurgitation is  mild to moderate. No aortic stenosis is present. Aortic regurgitation PHT  measures 581 msec.   6. Aortic dilatation noted. There is mild dilatation of the aortic root,  measuring 44 mm. There is moderate dilatation of the ascending aorta,  measuring 46 mm.   7. The inferior vena cava is normal in size with greater than 50%  respiratory variability, suggesting right atrial pressure of 3 mmHg.    Labs/Other Tests and Data Reviewed:        Cardiac MRI 06/04/23:IMPRESSION: 4.7 cm ascending aortic aneurysm, increased from 4.1 cm on 02/19/2019. Ascending thoracic aortic aneurysm. Recommend semi-annual imaging followup by CTA or MRA and referral to cardiothoracic surgery if not already obtained.  Recent Labs: 11/22/2022: ALT 27; TSH 0.605 05/28/2023: BUN 19; Creatinine, Ser 1.34; Hemoglobin 16.2; Platelets 206; Potassium 4.3; Sodium 140   Recent Lipid Panel Lab Results  Component Value Date/Time   CHOL 160 11/22/2022 04:21 PM   CHOL 202 (H) 04/29/2015 07:34 AM   TRIG 279 (H) 11/22/2022 04:21 PM   TRIG 165 (H) 04/29/2015 07:34 AM   HDL 46 11/22/2022 04:21 PM   HDL 47 04/29/2015 07:34 AM   CHOLHDL 3.5 11/22/2022 04:21 PM   CHOLHDL 3 08/24/2016 07:27 AM   LDLCALC 69 11/22/2022 04:21 PM   LDLCALC 122 (H) 04/29/2015 07:34 AM   LDLDIRECT 78.7 03/28/2013 07:32 AM   Dated 08/12/19: creatinine 1.2. otherwise CMET normal. CBC and TSH normal. Cholesterol 177, triglycerides 209, HDL 43, LDL 92.  Dated 02/19/20: Hgb 12.9, creatinine 1.3. TSH 5.22. ALT 25. Dated 08/19/20: cholesterol 161,  triglycerides 186, HDL 43, LDL 81. CMET normal.  Dated 10/12/21:  cholesterol 153, triglycerides 105, HDL 45, LDL 89. Creatinine 1.36. otherwise CMET, CBC, and TFTs normal. Dated 12/11/22: cholesterol 158, triglycerides 115, HDL 39, LDL 96. LFTs normal Dated 02/20/23: normal TSH   Wt Readings from Last 3 Encounters:  09/17/23 211 lb 3.2 oz (95.8 kg)  09/11/23 214 lb 12.8 oz (97.4 kg)  07/09/23 214 lb (97.1 kg)     Objective:    Vital Signs:  BP 130/62   Pulse 72   Ht 5\' 11"  (1.803 m)   Wt 211 lb 3.2 oz (95.8 kg)   SpO2 98%   BMI 29.46 kg/m    GENERAL:  Well appearing WM in NAD HEENT:  PERRL, EOMI, sclera are clear. Oropharynx is clear. NECK:  No jugular venous distention, carotid upstroke brisk and symmetric, no bruits, no thyromegaly or adenopathy LUNGS:  Clear to auscultation bilaterally CHEST:  Unremarkable HEART:  IRRR,  PMI not displaced or sustained,S1 and S2 within normal limits, no S3, no S4: no clicks, no rubs, no murmurs.  ABD:  Soft, nontender. BS +, no masses or bruits. No hepatomegaly, no splenomegaly EXT:  2 + pulses throughout, no  LLE edema, no cyanosis no clubbing no redness SKIN:  Warm and dry.  No rashes NEURO:  Alert and oriented x 3. Cranial nerves II through XII intact. PSYCH:  Cognitively intact   ASSESSMENT & PLAN:    Hypertrophic cardiomyopathy:  s/p septal Myectomy at [facility] in June 2021. S/p DDDR pacemaker for CHB which was anticipated. Course complicated by development of large Left leg DVT and pulmonary embolus. This was associated with  AFib/flutter.On Eliquis. Echo April 2024 was stable. He is asymptomatic.    Thoracic aortic aneurysm: latest MRI shows this has increased in size from prior. Plan repeat CTA at 6 months. If it grows further will need CT surgery referral.    Atrial flutter - he was symptomatic. S/p DCCV in April but only lasted a month. S/p ablation in  October with Dr Lalla Brothers. On Eliquis. Maintaining NSR   Hypothyroidism: On Synthroid. Labs followed with PCP   History of PE past- more  recent DVT/ PE post op heart surgery. Repeat venous doppler showed persistent common femoral and popliteal DVT but improved signal distally. Will continue Eliquis chronically and support hose. He does note some bluish discoloration of foot at night. Normal pulses  Second degree AV block Mobitz type 1 progressing to CHB post septal myectomy. S/p DDDR pacemaker with Medtronic generator. Pacer check normal  7. Hypercholesterolemia. Due for follow up labs with PCP  8.   History of HIT.    Medication Adjustments/Labs and Tests Ordered: Current medicines are reviewed at length with the patient today.  Concerns regarding medicines are outlined above.   Tests Ordered: No orders of the defined types were placed in this encounter.    Medication Changes: No orders of the defined types were placed in this encounter.    Follow Up: chest CTA in April. Follow up with me in 6 months  Signed, Whittley Carandang [place], MD  09/17/2023 8:28 AM    [facility]

## 2023-09-17 ENCOUNTER — Ambulatory Visit: Payer: Medicare Other | Attending: Cardiology | Admitting: Cardiology

## 2023-09-17 ENCOUNTER — Encounter: Payer: Self-pay | Admitting: Cardiology

## 2023-09-17 ENCOUNTER — Telehealth: Payer: Self-pay

## 2023-09-17 VITALS — BP 130/62 | HR 72 | Ht 71.0 in | Wt 211.2 lb

## 2023-09-17 DIAGNOSIS — I422 Other hypertrophic cardiomyopathy: Secondary | ICD-10-CM | POA: Insufficient documentation

## 2023-09-17 DIAGNOSIS — I442 Atrioventricular block, complete: Secondary | ICD-10-CM | POA: Diagnosis not present

## 2023-09-17 DIAGNOSIS — Z950 Presence of cardiac pacemaker: Secondary | ICD-10-CM | POA: Insufficient documentation

## 2023-09-17 DIAGNOSIS — I7121 Aneurysm of the ascending aorta, without rupture: Secondary | ICD-10-CM | POA: Insufficient documentation

## 2023-09-17 DIAGNOSIS — I483 Typical atrial flutter: Secondary | ICD-10-CM | POA: Diagnosis present

## 2023-09-17 NOTE — Telephone Encounter (Signed)
Spoke to patient chest ct scheduled at [facility] hospital 4/28 arrive at 9:45 am.

## 2023-09-17 NOTE — Patient Instructions (Signed)
Medication Instructions:  Continue same medications *If you need a refill on your cardiac medications before your next appointment, please call your pharmacy*   Lab Work: Bmet to be done 1 week before chest ct   Testing/Procedures: Chest CT the end of 11/2023   Follow-Up: At [facility], you and your health needs are our priority.  As part of our continuing mission to provide you with exceptional heart care, we have created designated Provider Care Teams.  These Care Teams include your primary Cardiologist (physician) and Advanced Practice Providers (APPs -  Physician Assistants and Nurse Practitioners) who all work together to provide you with the care you need, when you need it.  We recommend signing up for the patient portal called "MyChart".  Sign up information is provided on this After Visit Summary.  MyChart is used to connect with patients for Virtual Visits (Telemedicine).  Patients are able to view lab/test results, encounter notes, upcoming appointments, etc.  Non-urgent messages can be sent to your provider as well.   To learn more about what you can do with MyChart, go to [email or web address].    Your next appointment:  6 months   Call I  June to schedule August appointment     Provider:  Dr.Jordan

## 2023-09-18 ENCOUNTER — Telehealth: Payer: Self-pay

## 2023-09-18 NOTE — Telephone Encounter (Signed)
Morrie Sheldon at [facility] indicated on her pre-op request that the patient should hold the blood thinner for 4 days when our office indicated two days.  Just wanted to make sure of the hold time.

## 2023-09-18 NOTE — Telephone Encounter (Signed)
 Hello Dr. Jordan,  Chase Hill. Tippin 78 year old male is requesting preoperative cardiac evaluation for MRI fusion prostate biopsy.  EP has provided recommendations from their end.  He was seen by you in clinic yesterday.  Would you be able to provide preoperative cardiac risk for his upcoming biopsy?  Thank you for your help.  Please direct your response to [facility] preop pool.  Chase Hill. Chase Pineda NP-C     09/18/2023, 11:04 AM [facility] [address] Office [phone] Fax [phone]

## 2023-09-18 NOTE — Telephone Encounter (Signed)
     Primary Cardiologist: Peter Jordan, MD  Chart reviewed as part of pre-operative protocol coverage. Given past medical history and time since last visit, based on ACC/AHA guidelines, Chase Hill would be at acceptable risk for the planned procedure without further cardiovascular testing.   Per Dr. Calton may proceed with upcoming prostate biopsy.  His Eliquis  may be held for 2 days prior to his procedure.  Per Charlies Arthur PA-C-his device will need pre-- post skin management with MRI.  Otherwise no specific device management is needed for prostate procedure  I will route this recommendation to the requesting party via Epic fax function and remove from pre-op pool.  Please call with questions.  Josefa HERO. Babe Anthis NP-C     09/18/2023, 11:19 AM [facility] [address] Office [phone] Fax [phone]

## 2023-09-18 NOTE — Telephone Encounter (Signed)
I will forward to preop APP and preop Pharm-d.

## 2023-09-18 NOTE — Telephone Encounter (Signed)
[facility] called in asking for an update on clearance request.

## 2023-09-18 NOTE — Telephone Encounter (Signed)
Will forward to pre op APP to review if the pt has been cleared.

## 2023-09-19 ENCOUNTER — Ambulatory Visit (INDEPENDENT_AMBULATORY_CARE_PROVIDER_SITE_OTHER): Payer: BC Managed Care – PPO

## 2023-09-19 DIAGNOSIS — I442 Atrioventricular block, complete: Secondary | ICD-10-CM

## 2023-09-19 LAB — CUP PACEART REMOTE DEVICE CHECK
Battery Remaining Longevity: 103 mo
Battery Voltage: 3 V
Brady Statistic AP VP Percent: 36.71 %
Brady Statistic AP VS Percent: 0.01 %
Brady Statistic AS VP Percent: 62.95 %
Brady Statistic AS VS Percent: 0.33 %
Brady Statistic RA Percent Paced: 36.73 %
Brady Statistic RV Percent Paced: 99.66 %
Date Time Interrogation Session: 20250204214558
Implantable Lead Connection Status: 753985
Implantable Lead Connection Status: 753985
Implantable Lead Implant Date: 20210604
Implantable Lead Implant Date: 20210604
Implantable Lead Location: 753859
Implantable Lead Location: 753860
Implantable Lead Model: 4076
Implantable Lead Model: 4076
Implantable Pulse Generator Implant Date: 20210604
Lead Channel Impedance Value: 285 Ohm
Lead Channel Impedance Value: 380 Ohm
Lead Channel Impedance Value: 380 Ohm
Lead Channel Impedance Value: 437 Ohm
Lead Channel Pacing Threshold Amplitude: 0.625 V
Lead Channel Pacing Threshold Amplitude: 0.625 V
Lead Channel Pacing Threshold Pulse Width: 0.4 ms
Lead Channel Pacing Threshold Pulse Width: 0.4 ms
Lead Channel Sensing Intrinsic Amplitude: 17.5 mV
Lead Channel Sensing Intrinsic Amplitude: 17.5 mV
Lead Channel Sensing Intrinsic Amplitude: 5.125 mV
Lead Channel Sensing Intrinsic Amplitude: 5.125 mV
Lead Channel Setting Pacing Amplitude: 1.5 V
Lead Channel Setting Pacing Amplitude: 2 V
Lead Channel Setting Pacing Pulse Width: 0.4 ms
Lead Channel Setting Sensing Sensitivity: 0.9 mV
Zone Setting Status: 755011
Zone Setting Status: 755011

## 2023-09-19 NOTE — Telephone Encounter (Signed)
   Patient Name: Chase Hill  DOB: 1945/12/16 MRN: [id number]  Primary Cardiologist: Peter Jordan, MD  Chart reviewed as part of pre-operative protocol coverage. Given past medical history and time since last visit, based on ACC/AHA guidelines, BAYLEY HURN is at acceptable risk for the planned procedure without further cardiovascular testing.   The patient was advised that if he develops new symptoms prior to surgery to contact our office to arrange for a follow-up visit, and he verbalized understanding.  Please advise that patient can hold Eliquis  4 days prior to procedure per Dr. Jordan.  Medication should be restarted postprocedure when surgically safe and hemostasis is achieved.  I will route this recommendation to the requesting party via Epic fax function and remove from pre-op pool.  Please call with questions.  Wyn Raddle, Jackee Shove, NP 09/19/2023, 8:52 AM

## 2023-09-22 ENCOUNTER — Encounter: Payer: Self-pay | Admitting: Cardiology

## 2023-10-05 ENCOUNTER — Other Ambulatory Visit (HOSPITAL_COMMUNITY): Payer: Self-pay | Admitting: Urology

## 2023-10-05 DIAGNOSIS — C61 Malignant neoplasm of prostate: Secondary | ICD-10-CM

## 2023-10-15 ENCOUNTER — Encounter (HOSPITAL_COMMUNITY)
Admission: RE | Admit: 2023-10-15 | Discharge: 2023-10-15 | Disposition: A | Discharge status: Home / Self Care | Payer: Medicare Other | Source: Ambulatory Visit | Attending: Urology | Admitting: Urology

## 2023-10-15 DIAGNOSIS — C61 Malignant neoplasm of prostate: Secondary | ICD-10-CM | POA: Insufficient documentation

## 2023-10-15 MED ORDER — FLOTUFOLASTAT F 18 GALLIUM 296-5846 MBQ/ML IV SOLN
8.0000 | Freq: Once | INTRAVENOUS | Status: DC
  Filled 2023-10-15: qty 8

## 2023-10-26 NOTE — Progress Notes (Signed)
 Remote pacemaker transmission.

## 2023-12-10 ENCOUNTER — Other Ambulatory Visit (HOSPITAL_COMMUNITY): Payer: BLUE CROSS/BLUE SHIELD

## 2023-12-19 ENCOUNTER — Ambulatory Visit (INDEPENDENT_AMBULATORY_CARE_PROVIDER_SITE_OTHER): Payer: BC Managed Care – PPO

## 2023-12-19 DIAGNOSIS — I442 Atrioventricular block, complete: Secondary | ICD-10-CM

## 2023-12-19 LAB — CUP PACEART REMOTE DEVICE CHECK
Battery Remaining Longevity: 100 mo
Battery Voltage: 3 V
Brady Statistic AP VP Percent: 44.1 %
Brady Statistic AP VS Percent: 0.01 %
Brady Statistic AS VP Percent: 55.8 %
Brady Statistic AS VS Percent: 0.08 %
Brady Statistic RA Percent Paced: 43.99 %
Brady Statistic RV Percent Paced: 99.9 %
Date Time Interrogation Session: 20250506220114
Implantable Lead Connection Status: 753985
Implantable Lead Connection Status: 753985
Implantable Lead Implant Date: 20210604
Implantable Lead Implant Date: 20210604
Implantable Lead Location: 753859
Implantable Lead Location: 753860
Implantable Lead Model: 4076
Implantable Lead Model: 4076
Implantable Pulse Generator Implant Date: 20210604
Lead Channel Impedance Value: 266 Ohm
Lead Channel Impedance Value: 342 Ohm
Lead Channel Impedance Value: 361 Ohm
Lead Channel Impedance Value: 437 Ohm
Lead Channel Pacing Threshold Amplitude: 0.625 V
Lead Channel Pacing Threshold Amplitude: 0.75 V
Lead Channel Pacing Threshold Pulse Width: 0.4 ms
Lead Channel Pacing Threshold Pulse Width: 0.4 ms
Lead Channel Sensing Intrinsic Amplitude: 20.125 mV
Lead Channel Sensing Intrinsic Amplitude: 20.125 mV
Lead Channel Sensing Intrinsic Amplitude: 4.875 mV
Lead Channel Sensing Intrinsic Amplitude: 4.875 mV
Lead Channel Setting Pacing Amplitude: 1.5 V
Lead Channel Setting Pacing Amplitude: 2 V
Lead Channel Setting Pacing Pulse Width: 0.4 ms
Lead Channel Setting Sensing Sensitivity: 0.9 mV
Zone Setting Status: 755011
Zone Setting Status: 755011

## 2023-12-20 ENCOUNTER — Encounter: Payer: Self-pay | Admitting: Cardiology

## 2024-01-21 ENCOUNTER — Other Ambulatory Visit: Payer: Self-pay | Admitting: Cardiology

## 2024-01-21 ENCOUNTER — Ambulatory Visit (HOSPITAL_COMMUNITY)
Admission: RE | Admit: 2024-01-21 | Discharge: 2024-01-21 | Disposition: A | Discharge status: Home / Self Care | Source: Ambulatory Visit | Attending: Cardiology | Admitting: Cardiology

## 2024-01-21 DIAGNOSIS — I442 Atrioventricular block, complete: Secondary | ICD-10-CM

## 2024-01-21 DIAGNOSIS — Z950 Presence of cardiac pacemaker: Secondary | ICD-10-CM | POA: Diagnosis present

## 2024-01-21 DIAGNOSIS — I7121 Aneurysm of the ascending aorta, without rupture: Secondary | ICD-10-CM | POA: Diagnosis present

## 2024-01-21 DIAGNOSIS — I483 Typical atrial flutter: Secondary | ICD-10-CM | POA: Insufficient documentation

## 2024-01-21 DIAGNOSIS — I422 Other hypertrophic cardiomyopathy: Secondary | ICD-10-CM | POA: Diagnosis present

## 2024-01-21 MED ORDER — IOHEXOL 350 MG/ML SOLN
100.0000 mL | Freq: Once | INTRAVENOUS | Status: AC | PRN
  Administered 2024-01-21: 100 mL via INTRAVENOUS

## 2024-01-24 ENCOUNTER — Ambulatory Visit: Payer: Self-pay | Admitting: Cardiology

## 2024-01-24 ENCOUNTER — Telehealth: Payer: Self-pay | Admitting: Cardiology

## 2024-01-24 NOTE — Telephone Encounter (Signed)
 Paper Work Dropped Off: Disk from patient  Date: [id number]  Location of paper:  Dr. Peter [place] mail box

## 2024-01-25 ENCOUNTER — Other Ambulatory Visit: Payer: Self-pay

## 2024-01-25 DIAGNOSIS — I422 Other hypertrophic cardiomyopathy: Secondary | ICD-10-CM

## 2024-01-25 DIAGNOSIS — E7800 Pure hypercholesterolemia, unspecified: Secondary | ICD-10-CM

## 2024-01-30 NOTE — Progress Notes (Signed)
 Remote pacemaker transmission.

## 2024-02-04 LAB — LIPID PANEL

## 2024-02-05 ENCOUNTER — Ambulatory Visit: Payer: Self-pay | Admitting: Cardiology

## 2024-02-05 LAB — HEPATIC FUNCTION PANEL
ALT: 35 IU/L (ref 0–44)
AST: 34 IU/L (ref 0–40)
Albumin: 4.3 g/dL (ref 3.8–4.8)
Alkaline Phosphatase: 62 IU/L (ref 44–121)
Bilirubin Total: 0.5 mg/dL (ref 0.0–1.2)
Bilirubin, Direct: 0.16 mg/dL (ref 0.00–0.40)
Total Protein: 6.9 g/dL (ref 6.0–8.5)

## 2024-02-05 LAB — BASIC METABOLIC PANEL WITH GFR
BUN/Creatinine Ratio: 13 (ref 10–24)
BUN: 18 mg/dL (ref 8–27)
CO2: 21 mmol/L (ref 20–29)
Calcium: 10.1 mg/dL (ref 8.6–10.2)
Chloride: 103 mmol/L (ref 96–106)
Creatinine, Ser: 1.4 mg/dL — ABNORMAL HIGH (ref 0.76–1.27)
Glucose: 96 mg/dL (ref 70–99)
Potassium: 4.7 mmol/L (ref 3.5–5.2)
Sodium: 140 mmol/L (ref 134–144)
eGFR: 51 mL/min/{1.73_m2} — ABNORMAL LOW (ref >59)

## 2024-02-05 LAB — LIPID PANEL
Cholesterol, Total: 211 mg/dL — ABNORMAL HIGH (ref 100–199)
HDL: 56 mg/dL (ref >39)
LDL CALC COMMENT:: 3.8 ratio (ref 0.0–5.0)
LDL Chol Calc (NIH): 118 mg/dL — ABNORMAL HIGH (ref 0–99)
Triglycerides: 212 mg/dL — ABNORMAL HIGH (ref 0–149)
VLDL Cholesterol Cal: 37 mg/dL (ref 5–40)

## 2024-02-07 NOTE — Progress Notes (Signed)
 Date:  02/07/2024   ID:  Chase Hill, DOB [birth year], MRN [id number]  PCP:  Yolande Toribio MATSU, MD  Cardiologist:  Amany Rando [place], MD  Electrophysiologist:  OLE ONEIDA HOLTS, MD   Evaluation Performed:  Follow-Up Visit   Chief Complaint:  HOCM  History of Present Illness:    Chase Hill is a 78 y.o. male with PMH of hypertrophic cardiomyopathy, history of DVT/PE, TAA, hyperlipidemia, RBBB, Gilbert's syndrome, hypothyroidism.   Stress Myoview in 2011 showed inferior and apical ischemia.  Subsequent cardiac cath showed no obstructive disease.  In 2014, echocardiogram demonstrated basal septal hypertrophy with mild LV outflow tract gradient and chordal versus valvular SAM, moderate diastolic dysfunction.  Cardiac MRI obtained in June 2014 showed mild focal basal septal hypertrophy with systolic anterior motion of the mitral valve and mild MR.  There was no significant delayed enhancement.  He has been intolerant of pravastatin  due to myalgia.  He also developed  fatigue and orthostasis on beta-blocker.   Due to increased septal hypertrophy and outflow gradient on previous echo in July 2018, he was started on Cardizem .  This was later discontinued due to concern of conduction disease with first-degree AV block, left anterior fascicular block and a right bundle branch block. He later underwent laser ablation of varicose veins by Dr. Harvey. He was diagnosed with PE on CT angiogram of the chest in January 2019 and was placed on Xarelto .  PE was felt to be provoked by laser vein treatment.  He completed 6 months of therapy with Xarelto  and switch to aspirin .  CT angiogram of the chest recently showed 4.6 cm thoracic aortic aneurysm.  Recent lower extremity venous Doppler obtained on 04/10/2019 was negative for DVT.  Repeat echocardiogram obtained on 04/24/2019, EF was greater than 65%, severe LVH, significant outflow obstruction at the mid cavity and  LVOT with peak gradient of at least 100  mmHg, systolic anterior motion of mitral leaflet, mild dilatation of the ascending aorta measuring 43 mm.   We did proceed with cardiac cath on 06/26/19. This showed normal coronary anatomy. Normal LV and right heart pressures. LVOT gradient 20 mm Hg by cath. MRI was also done showing typical features of HOCM with predominant sigmoid septum and SAM. There was only mild late gadolinium enhancement. An Event monitor was placed with results noted below.  He was subsequently referred to [facility] and underwent a Septal myectomy on 01/14/20 by Dr Earma. On 01/16/20 he had a left sided DDD pacemaker (Medtronic XT AZT MRI) placed for development of complete heart block. He did well and was DC on 01/20/20. He was readmitted 6/24-6/29/21 with acute pulmonary embolus. He had a 10 day history of increased SOB. Was noted on pacemaker check to have paroxysmal AFib. Was started on Eliquis  but symptoms progressed and he presented to the ED.EKG showed atrial flutter. Chest x-ray showed minimal fibrosis/atelectasis in left lung base, otherwise clear. Chest CT showed acute multilobe for segmental and subsegmental thrombo emboli affecting all lobes. Chest CT and bedside echocardiogram did not reveal evidence of right heart strain. The patient received IV fluids, loading dose of heparin  and was initiated on high-intensity heparin  GTT. The following morning the patient did have a dip in his platelets and vascular Medicine had concern for possible HIT, which was positive. The patient was transitioned to bivalirudin . Lupus anticoagulant was negative. Upon discharge the patient transition back to apixaban  10 mg b.i.d. x7 days and then 5 mg b.i.d. x3 months or indefinitely for  atrial fibrillation. LE doppler showed extensive DVT in left leg from the common femoral vein down.The patient's outpatient cardiologist, Dr. Jocelyne, was contacted regarding starting amiodarone while the patient was hospitalized. He recommended initiating amiodarone  200 mg b.i.d. x7 days followed by 200 mg daily with Cardiology follow up.  Since then he has had persistent Afib. Was seen by Dr Cindie who initially increased his amiodarone. He then developed side effects on the amiodarone with tremors, difficulty writing and typing,  and imbalance. He had DCCV on 04/12/20 and amiodarone was discontinued. Last pacer check in May 2023 showed Afib burden < 0.1%. In June 2022 Venous dopplers done at that time due to swelling and were unchanged from before with left femoral and popliteal chronic DVT.   Echo done 03/15/21 results as noted below.   He was seen in April. He had noted increased fatigue and dyspnea with exertion. No real palpitations. No chest pain.  Still working. No chest pain or palpitations. His energy level is good. On his last visit he was found to be back in Aflutter. Echo looked good. He underwent DCCV on April 24. Maintained NSR for about a month then went back into flutter. He was seen by Dr Cindie and underwent ablation on October 28. When seen recently by EP he was maintaining NSR.   He has undergone treatment for prostate CA at the [facility] clinic with RT and hormonal therapy. As part of his work up he had prostate MRI showing left iliac aneurysm and possible chronic dissection. No claudication symptoms. Denies any palpitations, dizziness, SOB. Does note more fatigue.     Past Medical History:  Diagnosis Date   ALLERGIC RHINITIS 08/16/2009   Qualifier: Diagnosis of  By: Tish MD, Elsie   Onset:as a child Character: perennial but increased seasonally Triggers: dust mites; minor as pollen Allergy testing: yes X 2; as child & in 1995, Dr Amaryllis Finn. Never took shots Maintenance medications/ response:Loratidine daily Smoking history:never Family history pulmonary disease: no      Cervical radiculopathy at C7    PMH of   Chest pain 11/22/2012   Evaluated by Dr Ezra Shuck, Cardiology 2014 ECHO, MR : ? hypertrophic cardiomyopathy Rx: Beta blocker     Diverticulosis    colonoscopy 10-15-2006   DIVERTICULOSIS, COLON 08/16/2009   Qualifier: Diagnosis of  By: Tish MD, Elsie   Dr Jakie, GI     DVT (deep venous thrombosis) (HCC)    ELECTROCARDIOGRAM, ABNORMAL 08/16/2009   Qualifier: Diagnosis of  By: Tish MD, William   Right bundle branch block, left posterior fascicular block( bifascicular block), not present 01/17/2007; noted 08/16/2009     GERD (gastroesophageal reflux disease)    Bertrum syndrome    GILBERT'S SYNDROME 01/14/2007   Qualifier: Diagnosis of  By: Tish MD, William     Hemorrhoids    Hyperlipidemia    Hypothyroidism    Obstructive hypertrophic cardiomyopathy (HCC) 01/15/2013   Perennial allergic rhinitis with seasonal variation    Pulmonary embolism (HCC)    REFLUX, ESOPHAGEAL 01/14/2007   Qualifier: Diagnosis of  By: Tish MD, William     Thoracic aortic aneurysm without rupture (HCC) 03/20/2017   THYROID  FUNCTION TEST, ABNORMAL 09/07/2010   Qualifier: Diagnosis of  By: Tish MD, Elsie      Varicose veins of right lower extremity with complications 02/04/2018   Past Surgical History:  Procedure Laterality Date   ATRIAL FIBRILLATION ABLATION N/A 06/11/2023   Procedure: ATRIAL FIBRILLATION ABLATION;  Surgeon: Cindie Ole DASEN,  MD;  Location: [facility] INVASIVE CV LAB;  Service: Cardiovascular;  Laterality: N/A;   CARDIOVERSION N/A 04/12/2020   Procedure: CARDIOVERSION;  Surgeon: Lonni Slain, MD;  Location: [facility] ENDOSCOPY;  Service: Cardiovascular;  Laterality: N/A;   CARDIOVERSION N/A 12/06/2022   Procedure: CARDIOVERSION;  Surgeon: Raford Riggs, MD;  Location: [facility] INVASIVE CV LAB;  Service: Cardiovascular;  Laterality: N/A;   COLONOSCOPY  2010   Tics   ENDOVENOUS ABLATION SAPHENOUS VEIN W/ LASER Right 08/21/2018   endovenous laser ablation right greater saphenous vein and stab phlebectomy > 20 incisions right leg by Carlin Haddock MD    RIGHT/LEFT HEART CATH AND CORONARY ANGIOGRAPHY N/A 06/26/2019   Procedure:  RIGHT/LEFT HEART CATH AND CORONARY ANGIOGRAPHY;  Surgeon: [place], Mirinda Monte M, MD;  Location: [facility] INVASIVE CV LAB;  Service: Cardiovascular;  Laterality: N/A;   WISDOM TOOTH EXTRACTION       No outpatient medications have been marked as taking for the 02/13/24 encounter (Appointment) with [place], Taniqua Issa M, MD.     Allergies:   Dust mite extract and Heparin    Social History   Tobacco Use   Smoking status: Never   Smokeless tobacco: Never   Tobacco comments:    Never smoked 07/09/23  Vaping Use   Vaping status: Never Used  Substance Use Topics   Alcohol  use: Yes    Alcohol /week: 1.0 standard drink of alcohol     Types: 1 Glasses of wine per week    Comment:  < 3 /week , only socially   Drug use: No     Family Hx: The patient's family history includes Breast cancer in his mother; Congestive Heart Failure in his father; Coronary artery disease in his father and paternal aunt; Stroke (age of onset: 41) in his maternal grandmother. There is no history of Diabetes, Hypertension, Colon cancer, or Sleep apnea.  ROS:   Please see the history of present illness.    All other systems reviewed and are negative.   Prior CV studies:   The following studies were reviewed today:  CTA of chest 02/19/2019 IMPRESSION: 1. No change in caliber of the tubular ascending thoracic aorta, measuring 4.6 x 4.3 cm, not significantly changed in caliber, measuring 4.5 x 4.2 cm on examinations dating back to 03/23/2017. The sinuses of Valsalva measure up to 4.4 cm in caliber. Normal caliber of the aortic valve. The descending thoracic aorta is normal in caliber. No significant atherosclerosis.   2. Examination is not tailored for the evaluation of pulmonary embolism, however there is no obvious residual pulmonary embolism in the right lower lobe at the site of embolism seen on prior examination dated 09/11/2018.   3.  Stable, benign small pulmonary nodules.     Venous doppler 04/10/2019 Summary: Right:  Successful vein closure. There is no evidence of deep vein thrombosis in the lower extremity. No cystic structure found in the popliteal fossa. No significant change compared to previous study. Left: No evidence of common femoral vein obstruction.   *See table(s) above for measurements and observations.     Echo 04/24/2019 IMPRESSIONS  1. The left ventricle has hyperdynamic systolic function, with an ejection fraction of >65%. The cavity size was normal. There is severely increased left ventricular wall thickness. Left ventricular diastolic Doppler parameters are consistent with  impaired relaxation.  2. LVOT is narrowed The LV is severely hypertrophied. There is significant outflow obstruction at mid cavity and through LVOT with peak gradient at least 100 mm Hg. All consistent with HOCM.  3. The  right ventricle has normal systolic function. The cavity was normal. There is no increase in right ventricular wall thickness.  4. Left atrial size was mildly dilated.  5. There is systolic anterior motion of mitral leaflets.  6. The aortic valve is tricuspid. Mild thickening of the aortic valve. Aortic valve regurgitation is mild by color flow Doppler.  7. The aorta is abnormal unless otherwise noted.  8. The ascending aorta is normal in size and structure.  9. There is mild dilatation of the ascending aorta measuring 43 mm.    Cardiac cath 06/26/19:  RIGHT/LEFT HEART CATH AND CORONARY ANGIOGRAPHY  Conclusion    The left ventricular systolic function is normal. LV end diastolic pressure is normal. The left ventricular ejection fraction is 55-65% by visual estimate.   1. Normal coronary anatomy 2. Normal LV function' 3. 20 mm Hg LVOT gradient at rest. Typical Brockenbrough sign post PVC. 4. Normal/low LV filling pressures 5. Normal right  Heart pressures 6. Normal cardiac output 7. Moderate MR but post PVC   Plan: recommend further evaluation with cardiac MRI and event monitor.       CLINICAL DATA:  Hypertrophic cardiomyopathy suspected, further testing   COMPARISON: No images available from prior cardiac MRI for comparison   EXAM: CARDIAC MRI   TECHNIQUE: The patient was scanned on a 1.5 Tesla GE magnet. A dedicated cardiac coil was used. Functional imaging was done using Fiesta sequences. 2,3, and 4 chamber views were done to assess for RWMA's. Modified Simpson's rule using a short axis stack was used to calculate an ejection fraction on a dedicated work [employer]. The patient received 10mL GADAVIST  GADOBUTROL  1 MMOL/ML IV SOLN. After 10 minutes inversion recovery sequences were used to assess for infiltration and scar tissue.   CONTRAST:  10mL GADAVIST  GADOBUTROL  1 MMOL/ML IV SOLN   FINDINGS: LEFT VENTRICLE:   Normal left ventricular size. Hyperdynamic systolic function (LVEF = 69%).   There are no regional wall motion abnormalities.   Late gadolinium enhancement in the left ventricular myocardium is seen in the inferior RV insertion point at the mid ventricle and apex. This finding is seen in an area of increased myocardial wall thickness. Differential diagnosis includes fibrosis secondary to hypertrophic cardiomyopathy vs increased pulmonary pressures.   Normal T1 myocardial nulling kinetics suggest against a diagnosis of cardiac amyloidosis.   Maximal wall thickness: 18 mm   Location: Basal septum.   There is systolic anterior motion of the mitral valve. Flow dephasing suggests left ventricular outflow tract obstruction.   Mitral regurgitation is present and posteriorly directed.   No evidence of left ventricular apical aneurysm.   Findings are consistent with hypertrophic cardiomyopathy with obstruction. Morphologic subtype: Sigmoid subtype.   RIGHT VENTRICLE:   Normal right ventricular size, thickness and systolic function (RVEF = 63%).   There are no regional wall motion abnormalities.   No delayed  myocardial enhancement in the right ventricle.   ATRIA:   Mildly dilated left atrial chamber size.  normal right atrial size.   VALVES:   Systolic anterior motion of the mitral valve with posteriorly directed mitral regurgitation, qualitatively mild.   Qualitatively trivial aortic valve regurgitation by flow dephasing.   PERICARDIUM:   Normal pericardium.  No pericardial effusion.   AORTA:   Mid ascending aorta measures 43 mm in a sagittal transverse plane. Orthogonal measurements unable to be performed for true axial measurement.   OTHER:   MEASUREMENTS:   LVEDV: 145 ml   LVESV:  45 ml   SV: 100 ml   CO: 6 L/min   Myocardial mass: 184 g   RVEDV: 100 ml   RVEDS: 38 ml   RVSV: 63 mL   IMPRESSION: 1.  Hyperdynamic left ventricular systolic function.  LVEF 69%.   2.  Normal right ventricular chamber size and function.  RVEF 63%.   3. Findings consistent with hypertrophic cardiomyopathy, sigmoid subtype, maximal wall thickness 18 mm at the basal left ventricular septum.   4. Left ventricular outflow tract obstruction. Flow dephasing seen in the LVOT with systolic anterior motion of the mitral valve, and posteriorly directed mitral valve regurgitation.   5. Delayed myocardial enhancement is seen in an area of increased left ventricular wall thickness at the mid ventricle, at the inferior RV insertion point. This finding could represent fibrosis in the setting hypertrophic cardiomyopathy vs. increased pulmonary pressures.   6.  Mid ascending aorta measures 43 mm in a transverse plane.     Electronically Signed   By: Gayatri  Acharya  Event monitor 08/19/19.Study Highlights  Normal sinus rhythm with first degree AV block Episodes of second degree AV block Mobitz type 1 PVCs. One run of AIVR PACs with short bursts of PACs- longest 6 beats. No Afib noted- episodes noted on monitor appear to be artifact.   Echo 01/19/20:Echocardiogram 01/19/2020 Final  Impressions 1. Hypertrophic cardiomyopathy status post left ventricular septal myectomy (14-Jan-2020). 2. Systolic anterior motion of mitral apparatus with trivial mitral valve regurgitation. No dynamic left ventricular outflow tract obstruction at rest. 3. Mild aortic valve regurgitation (central jet). ERO 0.07 cm2, regurgitant volume 19 mL. 4. Normal left ventricular chamber size, calculated 2-D linear ejection fraction 70 %. 5. Normal right ventricular chamber size, mildly reduced systolic function, estimated right ventricular systolic pressure 22 mmHg (systolic blood pressure 129 mmHg). 6. Mildly enlarged mid ascending aorta diameter (diameter 45 mm at mid level). Upper limits of normal for age, sex, and BSA = 43 mm. 7. No pericardial effusion.    Echo 01/19/20: Final Impressions  1. Hypertrophic cardiomyopathy status post left ventricular septal myectomy (14-Jan-2020).  2. Systolic anterior motion of mitral apparatus with trivial mitral valve regurgitation. No  dynamic left ventricular outflow tract obstruction at rest.  3. Mild aortic valve regurgitation (central jet). ERO 0.07 cm^2, regurgitant volume 19 mL.  4. Normal left ventricular chamber size, calculated 2-D linear ejection fraction 70 %.  5. Normal right ventricular chamber size, mildly reduced systolic function, estimated right  ventricular systolic pressure 22 mmHg (systolic blood pressure 129 mmHg).  6. Mildly enlarged mid ascending aorta diameter (diameter 45 mm at mid level).  Upper limits of  normal for age, sex, and BSA = 43 mm.  7. No pericardial effusion.  Echo : 03/15/21: IMPRESSIONS     1. Left ventricular ejection fraction, by estimation, is 55 to 60%. The  left ventricle has normal function. The left ventricle has no regional  wall motion abnormalities but there is septal-lateral dyssynchrony  consistent with RV pacing. There is mild  left ventricular hypertrophy. Left ventricular diastolic parameters are  consistent  with Grade I diastolic dysfunction (impaired relaxation). There  is no significant LV outflow gradient noted and no mitral valve SAM noted.   2. Right ventricular systolic function is normal. The right ventricular  size is normal. Tricuspid regurgitation signal is inadequate for assessing  PA pressure.   3. The mitral valve is normal in structure. Trivial mitral valve  regurgitation. No evidence of mitral stenosis.  4. The aortic valve is tricuspid. Aortic valve regurgitation is mild to  moderate. Mild aortic valve sclerosis is present, with no evidence of  aortic valve stenosis.   5. Aortic dilatation noted. There is moderate dilatation of the ascending  aorta, measuring 46 mm.   6. The inferior vena cava is normal in size with greater than 50%  respiratory variability, suggesting right atrial pressure of 3 mmHg.   Comparison(s): 04/24/19 EF >65%.   Echo 11/24/22: IMPRESSIONS     1. Left ventricular ejection fraction, by estimation, is 60 to 65%. The  left ventricle has normal function. The left ventricle has no regional  wall motion abnormalities. Abnormal (paradoxical) septal motion,  consistent with RV pacemaker.      There is moderate concentric left ventricular hypertrophy. Left  ventricular diastolic parameters are indeterminate. No significant LV  outflow gradient and no MV SAM.   2. Right ventricular systolic function is normal. The right ventricular  size is normal. There is normal pulmonary artery systolic pressure. The  estimated right ventricular systolic pressure is 20.1 mmHg.   3. Left atrial size was severely dilated.   4. The mitral valve is normal in structure. Mild mitral valve  regurgitation. No evidence of mitral stenosis.   5. The aortic valve is normal in structure. Aortic valve regurgitation is  mild to moderate. No aortic stenosis is present. Aortic regurgitation PHT  measures 581 msec.   6. Aortic dilatation noted. There is mild dilatation of the aortic  root,  measuring 44 mm. There is moderate dilatation of the ascending aorta,  measuring 46 mm.   7. The inferior vena cava is normal in size with greater than 50%  respiratory variability, suggesting right atrial pressure of 3 mmHg.    Labs/Other Tests and Data Reviewed:        Cardiac MRI 06/04/23:IMPRESSION: 4.7 cm ascending aortic aneurysm, increased from 4.1 cm on 02/19/2019. Ascending thoracic aortic aneurysm. Recommend semi-annual imaging followup by CTA or MRA and referral to cardiothoracic surgery if not already obtained.  Recent Labs: 05/28/2023: Hemoglobin 16.2; Platelets 206 02/04/2024: ALT 35; BUN 18; Creatinine, Ser 1.40; Potassium 4.7; Sodium 140   Recent Lipid Panel Lab Results  Component Value Date/Time   CHOL 211 (H) 02/04/2024 09:53 AM   CHOL 202 (H) 04/29/2015 07:34 AM   TRIG 212 (H) 02/04/2024 09:53 AM   TRIG 165 (H) 04/29/2015 07:34 AM   HDL 56 02/04/2024 09:53 AM   HDL 47 04/29/2015 07:34 AM   CHOLHDL 3.8 02/04/2024 09:53 AM   CHOLHDL 3 08/24/2016 07:27 AM   LDLCALC 118 (H) 02/04/2024 09:53 AM   LDLCALC 122 (H) 04/29/2015 07:34 AM   LDLDIRECT 78.7 03/28/2013 07:32 AM   Dated 08/12/19: creatinine 1.2. otherwise CMET normal. CBC and TSH normal. Cholesterol 177, triglycerides 209, HDL 43, LDL 92.  Dated 02/19/20: Hgb 12.9, creatinine 1.3. TSH 5.22. ALT 25. Dated 08/19/20: cholesterol 161, triglycerides 186, HDL 43, LDL 81. CMET normal.  Dated 10/12/21: cholesterol 153, triglycerides 105, HDL 45, LDL 89. Creatinine 1.36. otherwise CMET, CBC, and TFTs normal. Dated 12/11/22: cholesterol 158, triglycerides 115, HDL 39, LDL 96. LFTs normal Dated 02/20/23: normal TSH   Wt Readings from Last 3 Encounters:  09/17/23 211 lb 3.2 oz (95.8 kg)  09/11/23 214 lb 12.8 oz (97.4 kg)  07/09/23 214 lb (97.1 kg)     Objective:    Vital Signs:  There were no vitals taken for this visit.   GENERAL:  Well appearing WM in  NAD HEENT:  PERRL, EOMI, sclera are clear. Oropharynx  is clear. NECK:  No jugular venous distention, carotid upstroke brisk and symmetric, no bruits, no thyromegaly or adenopathy LUNGS:  Clear to auscultation bilaterally CHEST:  Unremarkable HEART:  IRRR,  PMI not displaced or sustained,S1 and S2 within normal limits, no S3, no S4: no clicks, no rubs, no murmurs.  ABD:  Soft, nontender. BS +, no masses or bruits. No hepatomegaly, no splenomegaly EXT:  2 + pulses throughout, no  LLE edema, no cyanosis no clubbing no redness SKIN:  Warm and dry.  No rashes NEURO:  Alert and oriented x 3. Cranial nerves II through XII intact. PSYCH:  Cognitively intact   ASSESSMENT & PLAN:    Hypertrophic cardiomyopathy:  s/p septal Myectomy at [facility] in June 2021. S/p DDDR pacemaker for CHB which was anticipated. Course complicated by development of large Left leg DVT and pulmonary embolus. This was associated with  AFib/flutter.On Eliquis . Echo April 2024 was stable. He is asymptomatic.    Thoracic aortic aneurysm: on CT done 01/21/23 this was stable at 4.5 cm. Unchanged from prior   Atrial flutter - he was symptomatic. S/p DCCV in April but only lasted a month. S/p ablation in  October with Dr Cindie. On Eliquis . Maintaining NSR   Hypothyroidism: On Synthroid . Labs followed with PCP   History of PE past- more recent DVT/ PE post op heart surgery. Repeat venous doppler showed persistent common femoral and popliteal DVT but improved signal distally. Will continue Eliquis  chronically and support hose.   Second degree AV block Mobitz type 1 progressing to CHB post septal myectomy. S/p DDDR pacemaker with Medtronic generator. Pacer check normal  7. Hypercholesterolemia. Last LDL 97.   8.   History of HIT.   9.   Left iliac aneurysm noted on pelvic MRI. ? Chronic dissection. Will order US  to assess.    Medication Adjustments/Labs and Tests Ordered: Current medicines are reviewed at length with the patient today.  Concerns regarding medicines are outlined  above.   Tests Ordered: No orders of the defined types were placed in this encounter.    Medication Changes: No orders of the defined types were placed in this encounter.    Follow Up:  Follow up with me in 6 months  Signed, Kamyra Schroeck [place], MD  02/07/2024 4:11 PM    [facility]

## 2024-02-11 NOTE — Telephone Encounter (Signed)
 Already gave paperwork to Dr.Jordan.

## 2024-02-13 ENCOUNTER — Encounter: Payer: Self-pay | Admitting: Cardiology

## 2024-02-13 ENCOUNTER — Ambulatory Visit: Attending: Cardiology | Admitting: Cardiology

## 2024-02-13 VITALS — BP 108/68 | HR 75 | Ht 71.0 in | Wt 213.0 lb

## 2024-02-13 DIAGNOSIS — I723 Aneurysm of iliac artery: Secondary | ICD-10-CM | POA: Diagnosis present

## 2024-02-13 DIAGNOSIS — I7121 Aneurysm of the ascending aorta, without rupture: Secondary | ICD-10-CM | POA: Insufficient documentation

## 2024-02-13 DIAGNOSIS — I442 Atrioventricular block, complete: Secondary | ICD-10-CM | POA: Diagnosis present

## 2024-02-13 DIAGNOSIS — I4819 Other persistent atrial fibrillation: Secondary | ICD-10-CM | POA: Insufficient documentation

## 2024-02-13 DIAGNOSIS — Z950 Presence of cardiac pacemaker: Secondary | ICD-10-CM | POA: Diagnosis present

## 2024-02-13 DIAGNOSIS — I422 Other hypertrophic cardiomyopathy: Secondary | ICD-10-CM | POA: Insufficient documentation

## 2024-02-13 NOTE — Patient Instructions (Signed)
 Medication Instructions:  The current medical regimen is effective;  continue present plan and medications.  *If you need a refill on your cardiac medications before your next appointment, please call your pharmacy*  Lab Work: None If you have labs (blood work) drawn today and your tests are completely normal, you will receive your results only by: MyChart Message (if you have MyChart) OR A paper copy in the mail If you have any lab test that is abnormal or we need to change your treatment, we will call you to review the results.  Testing/Procedures: Ultrasound of Iliac arteries  Follow-Up: At [facility], you and your health needs are our priority.  As part of our continuing mission to provide you with exceptional heart care, our providers are all part of one team.  This team includes your primary Cardiologist (physician) and Advanced Practice Providers or APPs (Physician Assistants and Nurse Practitioners) who all work together to provide you with the care you need, when you need it.  Your next appointment:   6 months - Call in October to make January appointment   Provider:   Peter [place], MD    We recommend signing up for the patient portal called MyChart.  Sign up information is provided on this After Visit Summary.  MyChart is used to connect with patients for Virtual Visits (Telemedicine).  Patients are able to view lab/test results, encounter notes, upcoming appointments, etc.  Non-urgent messages can be sent to your provider as well.   To learn more about what you can do with MyChart, go to [email or web address].

## 2024-03-03 ENCOUNTER — Ambulatory Visit: Payer: Self-pay | Admitting: Cardiology

## 2024-03-03 ENCOUNTER — Ambulatory Visit (HOSPITAL_COMMUNITY)
Admission: RE | Admit: 2024-03-03 | Discharge: 2024-03-03 | Disposition: A | Discharge status: Home / Self Care | Source: Ambulatory Visit | Attending: Cardiology | Admitting: Cardiology

## 2024-03-03 DIAGNOSIS — I723 Aneurysm of iliac artery: Secondary | ICD-10-CM | POA: Diagnosis present

## 2024-03-19 ENCOUNTER — Ambulatory Visit (INDEPENDENT_AMBULATORY_CARE_PROVIDER_SITE_OTHER): Payer: BC Managed Care – PPO

## 2024-03-19 DIAGNOSIS — I442 Atrioventricular block, complete: Secondary | ICD-10-CM | POA: Diagnosis not present

## 2024-03-19 LAB — CUP PACEART REMOTE DEVICE CHECK
Battery Remaining Longevity: 97 mo
Battery Voltage: 3 V
Brady Statistic AP VP Percent: 33.32 %
Brady Statistic AP VS Percent: 0.01 %
Brady Statistic AS VP Percent: 66.46 %
Brady Statistic AS VS Percent: 0.21 %
Brady Statistic RA Percent Paced: 33.26 %
Brady Statistic RV Percent Paced: 99.78 %
Date Time Interrogation Session: 20250805210008
Implantable Lead Connection Status: 753985
Implantable Lead Connection Status: 753985
Implantable Lead Implant Date: 20210604
Implantable Lead Implant Date: 20210604
Implantable Lead Location: 753859
Implantable Lead Location: 753860
Implantable Lead Model: 4076
Implantable Lead Model: 4076
Implantable Pulse Generator Implant Date: 20210604
Lead Channel Impedance Value: 266 Ohm
Lead Channel Impedance Value: 380 Ohm
Lead Channel Impedance Value: 380 Ohm
Lead Channel Impedance Value: 437 Ohm
Lead Channel Pacing Threshold Amplitude: 0.5 V
Lead Channel Pacing Threshold Amplitude: 0.875 V
Lead Channel Pacing Threshold Pulse Width: 0.4 ms
Lead Channel Pacing Threshold Pulse Width: 0.4 ms
Lead Channel Sensing Intrinsic Amplitude: 20.125 mV
Lead Channel Sensing Intrinsic Amplitude: 20.125 mV
Lead Channel Sensing Intrinsic Amplitude: 4 mV
Lead Channel Sensing Intrinsic Amplitude: 4 mV
Lead Channel Setting Pacing Amplitude: 1.75 V
Lead Channel Setting Pacing Amplitude: 2 V
Lead Channel Setting Pacing Pulse Width: 0.4 ms
Lead Channel Setting Sensing Sensitivity: 0.9 mV
Zone Setting Status: 755011
Zone Setting Status: 755011

## 2024-03-21 ENCOUNTER — Ambulatory Visit: Payer: Self-pay | Admitting: Cardiology

## 2024-03-24 ENCOUNTER — Ambulatory Visit: Admitting: Cardiology

## 2024-05-12 NOTE — Progress Notes (Signed)
 Remote PPM Transmission

## 2024-06-18 ENCOUNTER — Ambulatory Visit (INDEPENDENT_AMBULATORY_CARE_PROVIDER_SITE_OTHER)

## 2024-06-18 DIAGNOSIS — I442 Atrioventricular block, complete: Secondary | ICD-10-CM

## 2024-06-19 LAB — CUP PACEART REMOTE DEVICE CHECK
Battery Remaining Longevity: 95 mo
Battery Voltage: 3 V
Brady Statistic AP VP Percent: 36.35 %
Brady Statistic AP VS Percent: 0.01 %
Brady Statistic AS VP Percent: 63.41 %
Brady Statistic AS VS Percent: 0.23 %
Brady Statistic RA Percent Paced: 36.34 %
Brady Statistic RV Percent Paced: 99.76 %
Date Time Interrogation Session: 20251104231855
Implantable Lead Connection Status: 753985
Implantable Lead Connection Status: 753985
Implantable Lead Implant Date: 20210604
Implantable Lead Implant Date: 20210604
Implantable Lead Location: 753859
Implantable Lead Location: 753860
Implantable Lead Model: 4076
Implantable Lead Model: 4076
Implantable Pulse Generator Implant Date: 20210604
Lead Channel Impedance Value: 266 Ohm
Lead Channel Impedance Value: 361 Ohm
Lead Channel Impedance Value: 380 Ohm
Lead Channel Impedance Value: 418 Ohm
Lead Channel Pacing Threshold Amplitude: 0.5 V
Lead Channel Pacing Threshold Amplitude: 0.75 V
Lead Channel Pacing Threshold Pulse Width: 0.4 ms
Lead Channel Pacing Threshold Pulse Width: 0.4 ms
Lead Channel Sensing Intrinsic Amplitude: 20.125 mV
Lead Channel Sensing Intrinsic Amplitude: 20.125 mV
Lead Channel Sensing Intrinsic Amplitude: 4.625 mV
Lead Channel Sensing Intrinsic Amplitude: 4.625 mV
Lead Channel Setting Pacing Amplitude: 1.5 V
Lead Channel Setting Pacing Amplitude: 2 V
Lead Channel Setting Pacing Pulse Width: 0.4 ms
Lead Channel Setting Sensing Sensitivity: 0.9 mV
Zone Setting Status: 755011
Zone Setting Status: 755011

## 2024-06-20 NOTE — Progress Notes (Signed)
 Remote PPM Transmission

## 2024-06-23 ENCOUNTER — Ambulatory Visit: Payer: Self-pay | Admitting: Cardiology

## 2024-08-22 NOTE — Progress Notes (Unsigned)
 "   Date:  08/22/2024   ID:  Chase Hill, DOB [birth year], MRN [id number]  PCP:  Yolande Toribio MATSU, MD  Cardiologist:  Clarita Mcelvain, MD  Electrophysiologist:  OLE ONEIDA HOLTS, MD (Inactive)   Evaluation Performed:  Follow-Up Visit   Chief Complaint:  HOCM  History of Present Illness:    Chase Hill is a 79 y.o. male with PMH of hypertrophic cardiomyopathy, history of DVT/PE, TAA, hyperlipidemia, RBBB, Gilbert's syndrome, hypothyroidism.   Stress Myoview in 2011 showed inferior and apical ischemia.  Subsequent cardiac cath showed no obstructive disease.  In 2014, echocardiogram demonstrated basal septal hypertrophy with mild LV outflow tract gradient and chordal versus valvular SAM, moderate diastolic dysfunction.  Cardiac MRI obtained in June 2014 showed mild focal basal septal hypertrophy with systolic anterior motion of the mitral valve and mild MR.  There was no significant delayed enhancement.  He has been intolerant of pravastatin  due to myalgia.  He also developed  fatigue and orthostasis on beta-blocker.   Due to increased septal hypertrophy and outflow gradient on previous echo in July 2018, he was started on Cardizem .  This was later discontinued due to concern of conduction disease with first-degree AV block, left anterior fascicular block and a right bundle branch block. He later underwent laser ablation of varicose veins by Dr. Harvey. He was diagnosed with PE on CT angiogram of the chest in January 2019 and was placed on Xarelto .  PE was felt to be provoked by laser vein treatment.  He completed 6 months of therapy with Xarelto  and switch to aspirin .  CT angiogram of the chest recently showed 4.6 cm thoracic aortic aneurysm.  Recent lower extremity venous Doppler obtained on 04/10/2019 was negative for DVT.  Repeat echocardiogram obtained on 04/24/2019, EF was greater than 65%, severe LVH, significant outflow obstruction at the mid cavity and  LVOT with peak gradient of at  least 100 mmHg, systolic anterior motion of mitral leaflet, mild dilatation of the ascending aorta measuring 43 mm.   We did proceed with cardiac cath on 06/26/19. This showed normal coronary anatomy. Normal LV and right heart pressures. LVOT gradient 20 mm Hg by cath. MRI was also done showing typical features of HOCM with predominant sigmoid septum and SAM. There was only mild late gadolinium enhancement. An Event monitor was placed with results noted below.  He was subsequently referred to [facility] and underwent a Septal myectomy on 01/14/20 by Dr Earma. On 01/16/20 he had a left sided DDD pacemaker (Medtronic XT AZT MRI) placed for development of complete heart block. He did well and was DC on 01/20/20. He was readmitted 6/24-6/29/21 with acute pulmonary embolus. He had a 10 day history of increased SOB. Was noted on pacemaker check to have paroxysmal AFib. Was started on Eliquis  but symptoms progressed and he presented to the ED.EKG showed atrial flutter. Chest x-ray showed minimal fibrosis/atelectasis in left lung base, otherwise clear. Chest CT showed acute multilobe for segmental and subsegmental thrombo emboli affecting all lobes. Chest CT and bedside echocardiogram did not reveal evidence of right heart strain. The patient received IV fluids, loading dose of heparin  and was initiated on high-intensity heparin  GTT. The following morning the patient did have a dip in his platelets and vascular Medicine had concern for possible HIT, which was positive. The patient was transitioned to bivalirudin . Lupus anticoagulant was negative. Upon discharge the patient transition back to apixaban  10 mg b.i.d. x7 days and then 5 mg b.i.d. x3 months  or indefinitely for atrial fibrillation. LE doppler showed extensive DVT in left leg from the common femoral vein down.The patient's outpatient cardiologist, Dr. Jocelyne, was contacted regarding starting amiodarone while the patient was hospitalized. He recommended initiating  amiodarone 200 mg b.i.d. x7 days followed by 200 mg daily with Cardiology follow up.  Since then he has had persistent Afib. Was seen by Dr Cindie who initially increased his amiodarone. He then developed side effects on the amiodarone with tremors, difficulty writing and typing,  and imbalance. He had DCCV on 04/12/20 and amiodarone was discontinued. Last pacer check in May 2023 showed Afib burden < 0.1%. In June 2022 Venous dopplers done at that time due to swelling and were unchanged from before with left femoral and popliteal chronic DVT.   Echo done 03/15/21 results as noted below.   He was seen in April. He had noted increased fatigue and dyspnea with exertion. No real palpitations. No chest pain.  Still working. No chest pain or palpitations. His energy level is good. On his last visit he was found to be back in Aflutter. Echo looked good. He underwent DCCV on April 24. Maintained NSR for about a month then went back into flutter. He was seen by Dr Cindie and underwent ablation on October 28. When seen recently by EP he was maintaining NSR.   He has undergone treatment for prostate CA at the [facility] with RT and hormonal therapy. As part of his work up he had prostate MRI showing left iliac aneurysm and possible chronic dissection. No claudication symptoms. Denies any palpitations, dizziness, SOB. Does note more fatigue.     Past Medical History:  Diagnosis Date   ALLERGIC RHINITIS 08/16/2009   Qualifier: Diagnosis of  By: Tish MD, Elsie   Onset:as a child Character: perennial but increased seasonally Triggers: dust mites; minor as pollen Allergy testing: yes X 2; as child & in 1995, Dr Amaryllis Finn. Never took shots Maintenance medications/ response:Loratidine daily Smoking history:never Family history pulmonary disease: no      Cervical radiculopathy at C7    PMH of   Chest pain 11/22/2012   Evaluated by Dr Ezra Shuck, Cardiology 2014 ECHO, MR : ? hypertrophic cardiomyopathy Rx: Beta  blocker    Diverticulosis    colonoscopy 10-15-2006   DIVERTICULOSIS, COLON 08/16/2009   Qualifier: Diagnosis of  By: Tish MD, Elsie   Dr Jakie, GI     DVT (deep venous thrombosis) (HCC)    ELECTROCARDIOGRAM, ABNORMAL 08/16/2009   Qualifier: Diagnosis of  By: Tish MD, William   Right bundle branch block, left posterior fascicular block( bifascicular block), not present 01/17/2007; noted 08/16/2009     GERD (gastroesophageal reflux disease)    Bertrum syndrome    GILBERT'S SYNDROME 01/14/2007   Qualifier: Diagnosis of  By: Tish MD, William     Hemorrhoids    Hyperlipidemia    Hypothyroidism    Obstructive hypertrophic cardiomyopathy (HCC) 01/15/2013   Perennial allergic rhinitis with seasonal variation    Pulmonary embolism (HCC)    REFLUX, ESOPHAGEAL 01/14/2007   Qualifier: Diagnosis of  By: Tish MD, William     Thoracic aortic aneurysm without rupture 03/20/2017   THYROID  FUNCTION TEST, ABNORMAL 09/07/2010   Qualifier: Diagnosis of  By: Tish MD, Elsie      Varicose veins of right lower extremity with complications 02/04/2018   Past Surgical History:  Procedure Laterality Date   ATRIAL FIBRILLATION ABLATION N/A 06/11/2023   Procedure: ATRIAL FIBRILLATION ABLATION;  Surgeon: Cindie,  Ole DASEN, MD;  Location: [facility] INVASIVE CV LAB;  Service: Cardiovascular;  Laterality: N/A;   CARDIOVERSION N/A 04/12/2020   Procedure: CARDIOVERSION;  Surgeon: Lonni Slain, MD;  Location[facility] ENDOSCOPY;  Service: Cardiovascular;  Laterality: N/A;   CARDIOVERSION N/A 12/06/2022   Procedure: CARDIOVERSION;  Surgeon: Raford Riggs, MD;  Location: [facility] INVASIVE CV LAB;  Service: Cardiovascular;  Laterality: N/A;   COLONOSCOPY  2010   Tics   ENDOVENOUS ABLATION SAPHENOUS VEIN W/ LASER Right 08/21/2018   endovenous laser ablation right greater saphenous vein and stab phlebectomy > 20 incisions right leg by Carlin Haddock MD    RIGHT/LEFT HEART CATH AND CORONARY ANGIOGRAPHY N/A 06/26/2019    Procedure: RIGHT/LEFT HEART CATH AND CORONARY ANGIOGRAPHY;  Surgeon: Sakira Dahmer M, MD;  Location: [facility] INVASIVE CV LAB;  Service: Cardiovascular;  Laterality: N/A;   WISDOM TOOTH EXTRACTION       No outpatient medications have been marked as taking for the 08/25/24 encounter (Appointment) with Trevonne Nyland M, MD.     Allergies:   Dust mite extract and Heparin    Social History   Tobacco Use   Smoking status: Never   Smokeless tobacco: Never   Tobacco comments:    Never smoked 07/09/23  Vaping Use   Vaping status: Never Used  Substance Use Topics   Alcohol  use: Yes    Alcohol /week: 1.0 standard drink of alcohol     Types: 1 Glasses of wine per week    Comment:  < 3 /week , only socially   Drug use: No     Family Hx: The patient's family history includes Breast cancer in his mother; Congestive Heart Failure in his father; Coronary artery disease in his father and paternal aunt; Stroke (age of onset: 58) in his maternal grandmother. There is no history of Diabetes, Hypertension, Colon cancer, or Sleep apnea.  ROS:   Please see the history of present illness.    All other systems reviewed and are negative.   Prior CV studies:   The following studies were reviewed today:  CTA of chest 02/19/2019 IMPRESSION: 1. No change in caliber of the tubular ascending thoracic aorta, measuring 4.6 x 4.3 cm, not significantly changed in caliber, measuring 4.5 x 4.2 cm on examinations dating back to 03/23/2017. The sinuses of Valsalva measure up to 4.4 cm in caliber. Normal caliber of the aortic valve. The descending thoracic aorta is normal in caliber. No significant atherosclerosis.   2. Examination is not tailored for the evaluation of pulmonary embolism, however there is no obvious residual pulmonary embolism in the right lower lobe at the site of embolism seen on prior examination dated 09/11/2018.   3.  Stable, benign small pulmonary nodules.     Venous doppler  04/10/2019 Summary: Right: Successful vein closure. There is no evidence of deep vein thrombosis in the lower extremity. No cystic structure found in the popliteal fossa. No significant change compared to previous study. Left: No evidence of common femoral vein obstruction.   *See table(s) above for measurements and observations.     Echo 04/24/2019 IMPRESSIONS  1. The left ventricle has hyperdynamic systolic function, with an ejection fraction of >65%. The cavity size was normal. There is severely increased left ventricular wall thickness. Left ventricular diastolic Doppler parameters are consistent with  impaired relaxation.  2. LVOT is narrowed The LV is severely hypertrophied. There is significant outflow obstruction at mid cavity and through LVOT with peak gradient at least 100 mm Hg. All consistent with HOCM.  3. The right ventricle has normal systolic function. The cavity was normal. There is no increase in right ventricular wall thickness.  4. Left atrial size was mildly dilated.  5. There is systolic anterior motion of mitral leaflets.  6. The aortic valve is tricuspid. Mild thickening of the aortic valve. Aortic valve regurgitation is mild by color flow Doppler.  7. The aorta is abnormal unless otherwise noted.  8. The ascending aorta is normal in size and structure.  9. There is mild dilatation of the ascending aorta measuring 43 mm.    Cardiac cath 06/26/19:  RIGHT/LEFT HEART CATH AND CORONARY ANGIOGRAPHY  Conclusion    The left ventricular systolic function is normal. LV end diastolic pressure is normal. The left ventricular ejection fraction is 55-65% by visual estimate.   1. Normal coronary anatomy 2. Normal LV function' 3. 20 mm Hg LVOT gradient at rest. Typical Brockenbrough sign post PVC. 4. Normal/low LV filling pressures 5. Normal right  Heart pressures 6. Normal cardiac output 7. Moderate MR but post PVC   Plan: recommend further evaluation with cardiac MRI  and event monitor.      CLINICAL DATA:  Hypertrophic cardiomyopathy suspected, further testing   COMPARISON: No images available from prior cardiac MRI for comparison   EXAM: CARDIAC MRI   TECHNIQUE: The patient was scanned on a 1.5 Tesla GE magnet. A dedicated cardiac coil was used. Functional imaging was done using Fiesta sequences. 2,3, and 4 chamber views were done to assess for RWMA's. Modified Simpson's rule using a short axis stack was used to calculate an ejection fraction on a dedicated work [employer]. The patient received 10mL GADAVIST  GADOBUTROL  1 MMOL/ML IV SOLN. After 10 minutes inversion recovery sequences were used to assess for infiltration and scar tissue.   CONTRAST:  10mL GADAVIST  GADOBUTROL  1 MMOL/ML IV SOLN   FINDINGS: LEFT VENTRICLE:   Normal left ventricular size. Hyperdynamic systolic function (LVEF = 69%).   There are no regional wall motion abnormalities.   Late gadolinium enhancement in the left ventricular myocardium is seen in the inferior RV insertion point at the mid ventricle and apex. This finding is seen in an area of increased myocardial wall thickness. Differential diagnosis includes fibrosis secondary to hypertrophic cardiomyopathy vs increased pulmonary pressures.   Normal T1 myocardial nulling kinetics suggest against a diagnosis of cardiac amyloidosis.   Maximal wall thickness: 18 mm   Location: Basal septum.   There is systolic anterior motion of the mitral valve. Flow dephasing suggests left ventricular outflow tract obstruction.   Mitral regurgitation is present and posteriorly directed.   No evidence of left ventricular apical aneurysm.   Findings are consistent with hypertrophic cardiomyopathy with obstruction. Morphologic subtype: Sigmoid subtype.   RIGHT VENTRICLE:   Normal right ventricular size, thickness and systolic function (RVEF = 63%).   There are no regional wall motion  abnormalities.   No delayed myocardial enhancement in the right ventricle.   ATRIA:   Mildly dilated left atrial chamber size.  normal right atrial size.   VALVES:   Systolic anterior motion of the mitral valve with posteriorly directed mitral regurgitation, qualitatively mild.   Qualitatively trivial aortic valve regurgitation by flow dephasing.   PERICARDIUM:   Normal pericardium.  No pericardial effusion.   AORTA:   Mid ascending aorta measures 43 mm in a sagittal transverse plane. Orthogonal measurements unable to be performed for true axial measurement.   OTHER:   MEASUREMENTS:   LVEDV: 145 ml  LVESV: 45 ml   SV: 100 ml   CO: 6 L/min   Myocardial mass: 184 g   RVEDV: 100 ml   RVEDS: 38 ml   RVSV: 63 mL   IMPRESSION: 1.  Hyperdynamic left ventricular systolic function.  LVEF 69%.   2.  Normal right ventricular chamber size and function.  RVEF 63%.   3. Findings consistent with hypertrophic cardiomyopathy, sigmoid subtype, maximal wall thickness 18 mm at the basal left ventricular septum.   4. Left ventricular outflow tract obstruction. Flow dephasing seen in the LVOT with systolic anterior motion of the mitral valve, and posteriorly directed mitral valve regurgitation.   5. Delayed myocardial enhancement is seen in an area of increased left ventricular wall thickness at the mid ventricle, at the inferior RV insertion point. This finding could represent fibrosis in the setting hypertrophic cardiomyopathy vs. increased pulmonary pressures.   6.  Mid ascending aorta measures 43 mm in a transverse plane.     Electronically Signed   By: Gayatri  Acharya  Event monitor 08/19/19.Study Highlights  Normal sinus rhythm with first degree AV block Episodes of second degree AV block Mobitz type 1 PVCs. One run of AIVR PACs with short bursts of PACs- longest 6 beats. No Afib noted- episodes noted on monitor appear to be artifact.   Echo  01/19/20:Echocardiogram 01/19/2020 Final Impressions 1. Hypertrophic cardiomyopathy status post left ventricular septal myectomy (14-Jan-2020). 2. Systolic anterior motion of mitral apparatus with trivial mitral valve regurgitation. No dynamic left ventricular outflow tract obstruction at rest. 3. Mild aortic valve regurgitation (central jet). ERO 0.07 cm2, regurgitant volume 19 mL. 4. Normal left ventricular chamber size, calculated 2-D linear ejection fraction 70 %. 5. Normal right ventricular chamber size, mildly reduced systolic function, estimated right ventricular systolic pressure 22 mmHg (systolic blood pressure 129 mmHg). 6. Mildly enlarged mid ascending aorta diameter (diameter 45 mm at mid level). Upper limits of normal for age, sex, and BSA = 43 mm. 7. No pericardial effusion.    Echo 01/19/20: Final Impressions  1. Hypertrophic cardiomyopathy status post left ventricular septal myectomy (14-Jan-2020).  2. Systolic anterior motion of mitral apparatus with trivial mitral valve regurgitation. No  dynamic left ventricular outflow tract obstruction at rest.  3. Mild aortic valve regurgitation (central jet). ERO 0.07 cm^2, regurgitant volume 19 mL.  4. Normal left ventricular chamber size, calculated 2-D linear ejection fraction 70 %.  5. Normal right ventricular chamber size, mildly reduced systolic function, estimated right  ventricular systolic pressure 22 mmHg (systolic blood pressure 129 mmHg).  6. Mildly enlarged mid ascending aorta diameter (diameter 45 mm at mid level).  Upper limits of  normal for age, sex, and BSA = 43 mm.  7. No pericardial effusion.  Echo : 03/15/21: IMPRESSIONS     1. Left ventricular ejection fraction, by estimation, is 55 to 60%. The  left ventricle has normal function. The left ventricle has no regional  wall motion abnormalities but there is septal-lateral dyssynchrony  consistent with RV pacing. There is mild  left ventricular hypertrophy. Left ventricular  diastolic parameters are  consistent with Grade I diastolic dysfunction (impaired relaxation). There  is no significant LV outflow gradient noted and no mitral valve SAM noted.   2. Right ventricular systolic function is normal. The right ventricular  size is normal. Tricuspid regurgitation signal is inadequate for assessing  PA pressure.   3. The mitral valve is normal in structure. Trivial mitral valve  regurgitation. No evidence of mitral stenosis.  4. The aortic valve is tricuspid. Aortic valve regurgitation is mild to  moderate. Mild aortic valve sclerosis is present, with no evidence of  aortic valve stenosis.   5. Aortic dilatation noted. There is moderate dilatation of the ascending  aorta, measuring 46 mm.   6. The inferior vena cava is normal in size with greater than 50%  respiratory variability, suggesting right atrial pressure of 3 mmHg.   Comparison(s): 04/24/19 EF >65%.   Echo 11/24/22: IMPRESSIONS     1. Left ventricular ejection fraction, by estimation, is 60 to 65%. The  left ventricle has normal function. The left ventricle has no regional  wall motion abnormalities. Abnormal (paradoxical) septal motion,  consistent with RV pacemaker.      There is moderate concentric left ventricular hypertrophy. Left  ventricular diastolic parameters are indeterminate. No significant LV  outflow gradient and no MV SAM.   2. Right ventricular systolic function is normal. The right ventricular  size is normal. There is normal pulmonary artery systolic pressure. The  estimated right ventricular systolic pressure is 20.1 mmHg.   3. Left atrial size was severely dilated.   4. The mitral valve is normal in structure. Mild mitral valve  regurgitation. No evidence of mitral stenosis.   5. The aortic valve is normal in structure. Aortic valve regurgitation is  mild to moderate. No aortic stenosis is present. Aortic regurgitation PHT  measures 581 msec.   6. Aortic dilatation noted.  There is mild dilatation of the aortic root,  measuring 44 mm. There is moderate dilatation of the ascending aorta,  measuring 46 mm.   7. The inferior vena cava is normal in size with greater than 50%  respiratory variability, suggesting right atrial pressure of 3 mmHg.    Labs/Other Tests and Data Reviewed:        Cardiac MRI 06/04/23:IMPRESSION: 4.7 cm ascending aortic aneurysm, increased from 4.1 cm on 02/19/2019. Ascending thoracic aortic aneurysm. Recommend semi-annual imaging followup by CTA or MRA and referral to cardiothoracic surgery if not already obtained.  Recent Labs: 02/04/2024: ALT 35; BUN 18; Creatinine, Ser 1.40; Potassium 4.7; Sodium 140   Recent Lipid Panel Lab Results  Component Value Date/Time   CHOL 211 (H) 02/04/2024 09:53 AM   CHOL 202 (H) 04/29/2015 07:34 AM   TRIG 212 (H) 02/04/2024 09:53 AM   TRIG 165 (H) 04/29/2015 07:34 AM   HDL 56 02/04/2024 09:53 AM   HDL 47 04/29/2015 07:34 AM   CHOLHDL 3.8 02/04/2024 09:53 AM   CHOLHDL 3 08/24/2016 07:27 AM   LDLCALC 118 (H) 02/04/2024 09:53 AM   LDLCALC 122 (H) 04/29/2015 07:34 AM   LDLDIRECT 78.7 03/28/2013 07:32 AM   Dated 08/12/19: creatinine 1.2. otherwise CMET normal. CBC and TSH normal. Cholesterol 177, triglycerides 209, HDL 43, LDL 92.  Dated 02/19/20: Hgb 12.9, creatinine 1.3. TSH 5.22. ALT 25. Dated 08/19/20: cholesterol 161, triglycerides 186, HDL 43, LDL 81. CMET normal.  Dated 10/12/21: cholesterol 153, triglycerides 105, HDL 45, LDL 89. Creatinine 1.36. otherwise CMET, CBC, and TFTs normal. Dated 12/11/22: cholesterol 158, triglycerides 115, HDL 39, LDL 96. LFTs normal Dated 02/20/23: normal TSH   Wt Readings from Last 3 Encounters:  02/13/24 213 lb (96.6 kg)  09/17/23 211 lb 3.2 oz (95.8 kg)  09/11/23 214 lb 12.8 oz (97.4 kg)     Objective:    Vital Signs:  There were no vitals taken for this visit.   GENERAL:  Well appearing WM in NAD HEENT:  PERRL, EOMI,  sclera are clear. Oropharynx is  clear. NECK:  No jugular venous distention, carotid upstroke brisk and symmetric, no bruits, no thyromegaly or adenopathy LUNGS:  Clear to auscultation bilaterally CHEST:  Unremarkable HEART:  IRRR,  PMI not displaced or sustained,S1 and S2 within normal limits, no S3, no S4: no clicks, no rubs, no murmurs.  ABD:  Soft, nontender. BS +, no masses or bruits. No hepatomegaly, no splenomegaly EXT:  2 + pulses throughout, no  LLE edema, no cyanosis no clubbing no redness SKIN:  Warm and dry.  No rashes NEURO:  Alert and oriented x 3. Cranial nerves II through XII intact. PSYCH:  Cognitively intact   ASSESSMENT & PLAN:    Hypertrophic cardiomyopathy:  s/p septal Myectomy at [facility] in June 2021. S/p DDDR pacemaker for CHB which was anticipated. Course complicated by development of large Left leg DVT and pulmonary embolus. This was associated with  AFib/flutter.On Eliquis . Echo April 2024 was stable. He is asymptomatic.    Thoracic aortic aneurysm: on CT done 01/21/23 this was stable at 4.5 cm. Unchanged from prior   Atrial flutter - he was symptomatic. S/p DCCV in April but only lasted a month. S/p ablation in  October with Dr Cindie. On Eliquis . Maintaining NSR   Hypothyroidism: On Synthroid . Labs followed with PCP   History of PE past- more recent DVT/ PE post op heart surgery. Repeat venous doppler showed persistent common femoral and popliteal DVT but improved signal distally. Will continue Eliquis  chronically and support hose.   Second degree AV block Mobitz type 1 progressing to CHB post septal myectomy. S/p DDDR pacemaker with Medtronic generator. Pacer check normal  7. Hypercholesterolemia. Last LDL 97.   8.   History of HIT.   9.   Left iliac aneurysm noted on pelvic MRI. ? Chronic dissection. Will order US  to assess.    Medication Adjustments/Labs and Tests Ordered: Current medicines are reviewed at length with the patient today.  Concerns regarding medicines are outlined  above.   Tests Ordered: No orders of the defined types were placed in this encounter.    Medication Changes: No orders of the defined types were placed in this encounter.    Follow Up:  Follow up with me in 6 months  Signed, Rachelanne Whidby, MD  08/22/2024 9:45 AM    [facility] "

## 2024-08-25 ENCOUNTER — Ambulatory Visit: Attending: Cardiology | Admitting: Cardiology

## 2024-08-25 ENCOUNTER — Encounter: Payer: Self-pay | Admitting: Cardiology

## 2024-08-25 VITALS — BP 128/80 | HR 61 | Ht 71.0 in | Wt 217.5 lb

## 2024-08-25 DIAGNOSIS — Z86711 Personal history of pulmonary embolism: Secondary | ICD-10-CM | POA: Diagnosis not present

## 2024-08-25 DIAGNOSIS — I442 Atrioventricular block, complete: Secondary | ICD-10-CM | POA: Diagnosis present

## 2024-08-25 DIAGNOSIS — I422 Other hypertrophic cardiomyopathy: Secondary | ICD-10-CM | POA: Diagnosis not present

## 2024-08-25 DIAGNOSIS — I483 Typical atrial flutter: Secondary | ICD-10-CM | POA: Insufficient documentation

## 2024-08-25 DIAGNOSIS — I7121 Aneurysm of the ascending aorta, without rupture: Secondary | ICD-10-CM | POA: Insufficient documentation

## 2024-08-25 DIAGNOSIS — I723 Aneurysm of iliac artery: Secondary | ICD-10-CM | POA: Diagnosis not present

## 2024-08-25 DIAGNOSIS — E7800 Pure hypercholesterolemia, unspecified: Secondary | ICD-10-CM | POA: Insufficient documentation

## 2024-08-25 NOTE — Patient Instructions (Addendum)
 Medication Instructions:  Continue same medications *If you need a refill on your cardiac medications before your next appointment, please call your pharmacy*  Lab Work: None ordered   Testing/Procedures: Echo and IIiac artery doppler to be scheduled in 6 months   Follow-Up: At [facility], you and your health needs are our priority.  As part of our continuing mission to provide you with exceptional heart care, our providers are all part of one team.  This team includes your primary Cardiologist (physician) and Advanced Practice Providers or APPs (Physician Assistants and Nurse Practitioners) who all work together to provide you with the care you need, when you need it.  Your next appointment:  6 months    Call in April to schedule July appointment   Dr.Carlisle's office will call back with appointment     Provider:  Dr.Jordan   We recommend signing up for the patient portal called MyChart.  Sign up information is provided on this After Visit Summary.  MyChart is used to connect with patients for Virtual Visits (Telemedicine).  Patients are able to view lab/test results, encounter notes, upcoming appointments, etc.  Non-urgent messages can be sent to your provider as well.   To learn more about what you can do with MyChart, go to [email or web address].

## 2024-09-17 ENCOUNTER — Encounter

## 2024-09-17 LAB — CUP PACEART REMOTE DEVICE CHECK
Battery Remaining Longevity: 93 mo
Battery Voltage: 2.99 V
Brady Statistic AP VP Percent: 42.59 %
Brady Statistic AP VS Percent: 0.01 %
Brady Statistic AS VP Percent: 57.18 %
Brady Statistic AS VS Percent: 0.23 %
Brady Statistic RA Percent Paced: 42.5 %
Brady Statistic RV Percent Paced: 99.76 %
Date Time Interrogation Session: 20260203202756
Implantable Lead Connection Status: 753985
Implantable Lead Connection Status: 753985
Implantable Lead Implant Date: 20210604
Implantable Lead Implant Date: 20210604
Implantable Lead Location: 753859
Implantable Lead Location: 753860
Implantable Lead Model: 4076
Implantable Lead Model: 4076
Implantable Pulse Generator Implant Date: 20210604
Lead Channel Impedance Value: 266 Ohm
Lead Channel Impedance Value: 342 Ohm
Lead Channel Impedance Value: 361 Ohm
Lead Channel Impedance Value: 418 Ohm
Lead Channel Pacing Threshold Amplitude: 0.625 V
Lead Channel Pacing Threshold Amplitude: 0.75 V
Lead Channel Pacing Threshold Pulse Width: 0.4 ms
Lead Channel Pacing Threshold Pulse Width: 0.4 ms
Lead Channel Sensing Intrinsic Amplitude: 20.125 mV
Lead Channel Sensing Intrinsic Amplitude: 20.125 mV
Lead Channel Sensing Intrinsic Amplitude: 3.875 mV
Lead Channel Sensing Intrinsic Amplitude: 3.875 mV
Lead Channel Setting Pacing Amplitude: 1.5 V
Lead Channel Setting Pacing Amplitude: 2 V
Lead Channel Setting Pacing Pulse Width: 0.4 ms
Lead Channel Setting Sensing Sensitivity: 0.9 mV
Zone Setting Status: 755011
Zone Setting Status: 755011

## 2024-10-07 ENCOUNTER — Ambulatory Visit: Admitting: Cardiology

## 2024-12-17 ENCOUNTER — Encounter

## 2025-03-18 ENCOUNTER — Encounter

## 2025-06-17 ENCOUNTER — Encounter
